# Patient Record
Sex: Male | Born: 1965
Health system: Southern US, Community
[De-identification: ages and names within clinical notes are randomized; demographics above are authoritative.]

## PROBLEM LIST (undated history)

## (undated) DIAGNOSIS — E119 Type 2 diabetes mellitus without complications: Secondary | ICD-10-CM

## (undated) DIAGNOSIS — I10 Essential (primary) hypertension: Secondary | ICD-10-CM

## (undated) HISTORY — DX: Type 2 diabetes mellitus without complications: E11.9

## (undated) HISTORY — DX: Essential (primary) hypertension: I10

---

## 1998-07-15 HISTORY — PX: CHOLECYSTECTOMY: SHX55

## 2013-10-16 ENCOUNTER — Ambulatory Visit: Payer: Self-pay | Admitting: Internal Medicine

## 2014-03-25 ENCOUNTER — Ambulatory Visit: Payer: Self-pay | Admitting: Cardiology

## 2015-11-11 ENCOUNTER — Other Ambulatory Visit: Payer: Self-pay | Admitting: Family Medicine

## 2016-01-17 DIAGNOSIS — I429 Cardiomyopathy, unspecified: Secondary | ICD-10-CM | POA: Insufficient documentation

## 2016-01-17 DIAGNOSIS — N419 Inflammatory disease of prostate, unspecified: Secondary | ICD-10-CM | POA: Insufficient documentation

## 2016-01-17 DIAGNOSIS — R0681 Apnea, not elsewhere classified: Secondary | ICD-10-CM | POA: Insufficient documentation

## 2016-01-17 DIAGNOSIS — I2510 Atherosclerotic heart disease of native coronary artery without angina pectoris: Secondary | ICD-10-CM | POA: Insufficient documentation

## 2016-01-17 DIAGNOSIS — F605 Obsessive-compulsive personality disorder: Secondary | ICD-10-CM | POA: Insufficient documentation

## 2016-01-17 DIAGNOSIS — Z8679 Personal history of other diseases of the circulatory system: Secondary | ICD-10-CM | POA: Insufficient documentation

## 2016-01-17 DIAGNOSIS — K219 Gastro-esophageal reflux disease without esophagitis: Secondary | ICD-10-CM | POA: Insufficient documentation

## 2016-11-12 ENCOUNTER — Other Ambulatory Visit: Payer: Self-pay | Admitting: Family Medicine

## 2016-12-11 LAB — HEMOGLOBIN A1C: HEMOGLOBIN A1C: 11.5

## 2016-12-12 ENCOUNTER — Encounter: Payer: Self-pay | Admitting: Family Medicine

## 2016-12-12 ENCOUNTER — Ambulatory Visit (INDEPENDENT_AMBULATORY_CARE_PROVIDER_SITE_OTHER): Payer: BLUE CROSS/BLUE SHIELD | Admitting: Family Medicine

## 2016-12-12 VITALS — BP 134/88 | HR 105 | Temp 97.8°F | Resp 16 | Wt 279.2 lb

## 2016-12-12 DIAGNOSIS — E119 Type 2 diabetes mellitus without complications: Secondary | ICD-10-CM

## 2016-12-12 DIAGNOSIS — Z13220 Encounter for screening for lipoid disorders: Secondary | ICD-10-CM | POA: Diagnosis not present

## 2016-12-12 MED ORDER — GLIPIZIDE 5 MG PO TABS: 5.0000 mg | ORAL_TABLET | Freq: Two times a day (BID) | ORAL | 3 refills | Status: DC

## 2016-12-12 NOTE — Progress Notes (Signed)
Subjective:     Patient ID: Ryan Webb, male   DOB: 10/03/1965, 51 y.o.   MRN: [id number]  HPI  Chief Complaint  Patient presents with  . Abnormal Lab    Patient comes in office today to address abnormal lab report from 12/11/16 ordered by [facility]. Patients Hgb A1C 11.5% and glucose was 495, patient states that he had his labs drawn yesterday afternoon and was not fasting.   He is accompanied by his wife today. States over the last two weeks he has noticed urinary frequency and fatigue. Reports strong family history of diabetes. No regular exercise but does walk a lot in his job. Has longtime habit of consuming 2 liters+ of Pepsi daily. Continues to be followed by cardiology, Dr. Lady GaryFath, for CAD and hypertension. Completing treatment for prostatitis per Dr. Evelene CroonWolff.   Review of Systems     Objective:   Physical Exam  Constitutional: He appears well-developed and well-nourished. No distress.  Psychiatric:  anxious       Assessment:    1. Diabetes mellitus without complication (HCC) - glipiZIDE (GLUCOTROL) 5 MG tablet; Take 1 tablet (5 mg total) by mouth 2 (two) times daily before a meal.  Dispense: 60 tablet; Refill: 3 - Comprehensive metabolic panel  2. Screening for cholesterol level - Lipid panel    Plan:   Stop Pepsi and start water. Further f/u in one week and pending labs.If renal status stable will add metformin, consider SLGT2 inhibitor.

## 2016-12-12 NOTE — Patient Instructions (Signed)
We will call you with the lab results. Stop Pepsi.

## 2016-12-13 ENCOUNTER — Other Ambulatory Visit: Payer: Self-pay | Admitting: Family Medicine

## 2016-12-13 LAB — COMPREHENSIVE METABOLIC PANEL
ALBUMIN: 4.2 g/dL (ref 3.5–5.5)
ALT: 41 IU/L (ref 0–44)
AST: 30 IU/L (ref 0–40)
Albumin/Globulin Ratio: 1.5 (ref 1.2–2.2)
Alkaline Phosphatase: 76 IU/L (ref 39–117)
BUN / CREAT RATIO: 17 (ref 9–20)
BUN: 19 mg/dL (ref 6–24)
Bilirubin Total: 0.6 mg/dL (ref 0.0–1.2)
CO2: 24 mmol/L (ref 18–29)
CREATININE: 1.14 mg/dL (ref 0.76–1.27)
Calcium: 9.5 mg/dL (ref 8.7–10.2)
Chloride: 96 mmol/L (ref 96–106)
GFR, EST AFRICAN AMERICAN: 86 mL/min/{1.73_m2} (ref >59)
GFR, EST NON AFRICAN AMERICAN: 75 mL/min/{1.73_m2} (ref >59)
GLOBULIN, TOTAL: 2.8 g/dL (ref 1.5–4.5)
Glucose: 345 mg/dL — ABNORMAL HIGH (ref 65–99)
Potassium: 5.1 mmol/L (ref 3.5–5.2)
SODIUM: 134 mmol/L (ref 134–144)
TOTAL PROTEIN: 7 g/dL (ref 6.0–8.5)

## 2016-12-13 LAB — LIPID PANEL
CHOL/HDL RATIO: 6.8 ratio — AB (ref 0.0–5.0)
Cholesterol, Total: 225 mg/dL — ABNORMAL HIGH (ref 100–199)
HDL: 33 mg/dL — AB (ref >39)
TRIGLYCERIDES: 482 mg/dL — AB (ref 0–149)

## 2016-12-15 LAB — LDL CHOLESTEROL, DIRECT: LDL DIRECT: 94 mg/dL (ref 0–99)

## 2016-12-15 LAB — SPECIMEN STATUS REPORT

## 2016-12-18 ENCOUNTER — Encounter: Payer: Self-pay | Admitting: Family Medicine

## 2016-12-19 ENCOUNTER — Encounter: Payer: Self-pay | Admitting: Family Medicine

## 2016-12-19 ENCOUNTER — Ambulatory Visit (INDEPENDENT_AMBULATORY_CARE_PROVIDER_SITE_OTHER): Payer: BLUE CROSS/BLUE SHIELD | Admitting: Family Medicine

## 2016-12-19 VITALS — BP 128/80 | HR 92 | Temp 98.5°F | Resp 16 | Wt 278.0 lb

## 2016-12-19 DIAGNOSIS — E119 Type 2 diabetes mellitus without complications: Secondary | ICD-10-CM

## 2016-12-19 LAB — GLUCOSE, POCT (MANUAL RESULT ENTRY): POC GLUCOSE: 105 mg/dL — AB (ref 70–99)

## 2016-12-19 MED ORDER — METFORMIN HCL 500 MG PO TABS: 500.0000 mg | ORAL_TABLET | Freq: Two times a day (BID) | ORAL | 1 refills | Status: DC

## 2016-12-19 NOTE — Progress Notes (Signed)
Subjective:     Patient ID: Ryan Webb, male   DOB: 12/01/1965, 51 y.o.   MRN: [id number]  HPI  Chief Complaint  Patient presents with  . Diabetes    pt reports that he is doing well on the medication and feeling ok.    Reports he has quit drinking Pepsi. Sleeping better with less urination. Accompanied by his wife and daughter.   Review of Systems     Objective:   Physical Exam  Constitutional: He appears well-developed and well-nourished. No distress.       Assessment:    1. Diabetes mellitus without complication (HCC) - POCT Glucose (CBG) - metFORMIN (GLUCOPHAGE) 500 MG tablet; Take 1 tablet (500 mg total) by mouth 2 (two) times daily with a meal.  Dispense: 60 tablet; Refill: 1 - Ambulatory referral to diabetic education    Plan:    May hold glipizide for a few days after starting metformin. Discussed updating eye exam and the possibility of adding cholesterol medication once diabetes controlled.

## 2016-12-19 NOTE — Patient Instructions (Signed)
The diabetes numbers we are trying to achieve are < 130 fasting in the AM and < 180 two hours after a meal. When starting the metformin, hold the glipizide for a few days until you are used to the metformin.

## 2017-01-22 ENCOUNTER — Encounter: Payer: Self-pay | Admitting: *Deleted

## 2017-01-22 ENCOUNTER — Encounter: Payer: BLUE CROSS/BLUE SHIELD | Attending: Family Medicine | Admitting: *Deleted

## 2017-01-22 VITALS — BP 122/80 | Ht 70.0 in | Wt 268.2 lb

## 2017-01-22 DIAGNOSIS — Z713 Dietary counseling and surveillance: Secondary | ICD-10-CM | POA: Diagnosis present

## 2017-01-22 DIAGNOSIS — E119 Type 2 diabetes mellitus without complications: Secondary | ICD-10-CM | POA: Diagnosis not present

## 2017-01-23 ENCOUNTER — Ambulatory Visit (INDEPENDENT_AMBULATORY_CARE_PROVIDER_SITE_OTHER): Payer: BLUE CROSS/BLUE SHIELD | Admitting: Family Medicine

## 2017-01-23 ENCOUNTER — Encounter: Payer: Self-pay | Admitting: Family Medicine

## 2017-01-23 VITALS — BP 130/84 | HR 99 | Temp 98.0°F | Resp 16 | Wt 269.6 lb

## 2017-01-23 DIAGNOSIS — E119 Type 2 diabetes mellitus without complications: Secondary | ICD-10-CM

## 2017-01-23 DIAGNOSIS — H6123 Impacted cerumen, bilateral: Secondary | ICD-10-CM

## 2017-01-23 MED ORDER — METFORMIN HCL 1000 MG PO TABS: 1000.0000 mg | ORAL_TABLET | Freq: Two times a day (BID) | ORAL | 3 refills | Status: DC

## 2017-01-23 MED ORDER — GLUCOSE BLOOD VI STRP: ORAL_STRIP | 3 refills | Status: AC

## 2017-01-23 NOTE — Patient Instructions (Signed)
Check blood sugars 2 x day before breakfast and 2 hrs after supper every day Bring blood sugar records to the next class  Call your doctor for a prescription for:  1. Meter strips (type) Contour Next  checking  2 times per day  2. Lancets (type) Contour Microlet checking  2      times per day  Exercise: Begin walking  for  10 minutes   3 days a week and gradually increase to 30 minutes 5 x week  Eat 3 meals day,  1-2  snacks a day Space meals 4-6 hours apart Don't skip meals Limit fried foods Continue to avoid sugar sweetened drinks (soda)   Return for classes on:

## 2017-01-23 NOTE — Patient Instructions (Addendum)
Continue to follow up with the [facility] classes. Let me know if you can't tolerate the higher dose of metformin. Do update your eye exam.

## 2017-01-23 NOTE — Progress Notes (Signed)
Subjective:     Patient ID: Lucas Mallowony W Dorin, male   DOB: 04/25/1966, 51 y.o.   MRN: [id number]  HPI  Chief Complaint  Patient presents with  . Diabetes    Patient returns back to office today for follow up patient was last seen on 12/19/16. Patients glucose at last visit was 105 and he was started on Metformin 500mg  BID. Patient reports good compliance and tolerance in medication. Patient reports that he saw nutrionist yesterday and has [facility], patient states that his glucose yesterday was 112. Patient reports this monring he checked blood sugar when he woke and it was 168.  Reports he discontinued glipizide due to two shaky episodes which improved with ingestion of a sweet. Also states he was using a Q-tip in his left ear and now can't hear well out of it. LDL was below 100 so have elected to titrate diabetes medication prior to adding cholesterol medication. Accompanied by his wife and daughter today.   Review of Systems     Objective:   Physical Exam  Constitutional: He appears well-developed and well-nourished. No distress.  HENT:  Bilateral cerumen obstruction: After irrigation by Kat ear canals are patent, T.M.'s intact, and patient reports improvement in his hearing.       Assessment:    1. Diabetes mellitus without complication (HCC) - metFORMIN (GLUCOPHAGE) 1000 MG tablet; Take 1 tablet (1,000 mg total) by mouth 2 (two) times daily with a meal.  Dispense: 180 tablet; Refill: 3 - glucose blood test strip; Check sugar twice daily  Dispense: 100 each; Refill: 3  2. Obstruction of ventilation tube of both ears by cerumen - EAR CERUMEN REMOVAL    Plan:    [facility]. A1C in two months on increased dose of metformin.

## 2017-01-23 NOTE — Progress Notes (Signed)
Diabetes Self-Management Education  Visit Type: First/Initial  Appt. Start Time: 1605 Appt. End Time: 1715  01/22/2017  [name], identified by name and date of birth, is a 51 y.o. male with a diagnosis of Diabetes: Type 2.   ASSESSMENT  Blood pressure 122/80, height 5\' 10"  (1.778 m), weight 268 lb 3.2 oz (121.7 kg). Body mass index is 38.48 kg/m.      Diabetes Self-Management Education - 01/22/17 1720      Visit Information   Visit Type First/Initial     Initial Visit   Diabetes Type Type 2   Are you currently following a meal plan? Yes   What type of meal plan do you follow? "no soft drinks, no sweets"   Are you taking your medications as prescribed? Yes   Date Diagnosed 1 month ago     Health Coping   How would you rate your overall health? Good     Psychosocial Assessment   Patient Belief/Attitude about Diabetes Motivated to manage diabetes  "concerned"   Self-care barriers None   Self-management support Doctor's office;Family   Other persons present Spouse/SO   Patient Concerns Nutrition/Meal planning;Medication;Monitoring;Healthy Lifestyle;Problem Solving;Weight Control;Glycemic Control   Special Needs None   Preferred Learning Style Auditory;Hands on   Learning Readiness Change in progress   How often do you need to have someone help you when you read instructions, pamphlets, or other written materials from your doctor or pharmacy? 1 - Never   What is the last grade level you completed in school? 12th     Pre-Education Assessment   Patient understands the diabetes disease and treatment process. Needs Instruction   Patient understands incorporating nutritional management into lifestyle. Needs Instruction   Patient undertands incorporating physical activity into lifestyle. Needs Instruction   Patient understands using medications safely. Needs Instruction   Patient understands monitoring blood glucose, interpreting and using results Needs Instruction   Patient understands prevention, detection, and treatment of acute complications. Needs Instruction   Patient understands prevention, detection, and treatment of chronic complications. Needs Instruction   Patient understands how to develop strategies to address psychosocial issues. Needs Instruction   Patient understands how to develop strategies to promote health/change behavior. Needs Instruction     Complications   Last HgB A1C per patient/outside source 11.5 %  12/11/16   How often do you check your blood sugar? 0 times/day (not testing)  Provided Contour Next One meter and instructed on use. BG upon return demonstration was 112 mg/dL at 4:094:55 pm - 5 hrs pp.    Have you had a dilated eye exam in the past 12 months? Yes   Have you had a dental exam in the past 12 months? Yes     Dietary Intake   Breakfast N/A   Snack (morning) peanut butter crackers   Lunch Congohinese, chicken and broccoli, ham biscuit, small round pizza, french fries   Snack (afternoon) fruit   Dinner Cong[place]inese, grilled or baked chicken, beef, fish, salad, green beans, potatoes, pintos   Beverage(s) water  - was drinking 3 Liters of Pepsi per day     Exercise   Exercise Type ADL's     Patient Education   Previous Diabetes Education No   Disease state  Definition of diabetes, type 1 and 2, and the diagnosis of diabetes   Nutrition management  Role of diet in the treatment of diabetes and the relationship between the three main macronutrients and blood glucose level;Reviewed blood glucose goals for pre  and post meals and how to evaluate the patients' food intake on their blood glucose level.   Physical activity and exercise  Role of exercise on diabetes management, blood pressure control and cardiac health.   Medications Reviewed patients medication for diabetes, action, purpose, timing of dose and side effects.   Monitoring Taught/evaluated SMBG meter.;Purpose and frequency of SMBG.;Taught/discussed recording of test results  and interpretation of SMBG.;Identified appropriate SMBG and/or A1C goals.   Chronic complications Relationship between chronic complications and blood glucose control   Psychosocial adjustment Identified and addressed patients feelings and concerns about diabetes     Individualized Goals (developed by patient)   Reducing Risk Improve blood sugars Decrease medications Prevent diabetes complications Lose weight Lead a healthier lifestyle Become more fit     Outcomes   Expected Outcomes Demonstrated interest in learning. Expect positive outcomes   Future DMSE 2 wks      Individualized Plan for Diabetes Self-Management Training:   Learning Objective:  Patient will have a greater understanding of diabetes self-management. Patient education plan is to attend individual and/or group sessions per assessed needs and concerns.   Plan:   Patient Instructions  Check blood sugars 2 x day before breakfast and 2 hrs after supper every day Bring blood sugar records to the next class Call your doctor for a prescription for:  1. Meter strips (type) Contour Next  checking  2 times per day  2. Lancets (type) Contour Microlet checking  2      times per day Exercise: Begin walking  for  10 minutes   3 days a week and gradually increase to 30 minutes 5 x week Eat 3 meals day,  1-2  snacks a day Space meals 4-6 hours apart Don't skip meals Limit fried foods Continue to avoid sugar sweetened drinks (soda)    Expected Outcomes:  Demonstrated interest in learning. Expect positive outcomes  Education material provided:  General Meal Planning Guidelines Simple Meal Plan Meter = Contour Next One  If problems or questions, patient to contact team via:  Sharion Settler, RN, CCM, CDE [phone]  Future DSME appointment: 2 wks  July 30 for Diabetes Class 1

## 2017-02-10 ENCOUNTER — Encounter: Payer: Self-pay | Admitting: Dietician

## 2017-02-10 ENCOUNTER — Encounter: Payer: BLUE CROSS/BLUE SHIELD | Admitting: Dietician

## 2017-02-10 VITALS — Ht 70.0 in | Wt 264.3 lb

## 2017-02-10 DIAGNOSIS — Z713 Dietary counseling and surveillance: Secondary | ICD-10-CM | POA: Diagnosis not present

## 2017-02-10 DIAGNOSIS — E119 Type 2 diabetes mellitus without complications: Secondary | ICD-10-CM

## 2017-02-10 NOTE — Progress Notes (Signed)

## 2017-02-17 ENCOUNTER — Encounter: Payer: BLUE CROSS/BLUE SHIELD | Attending: Family Medicine | Admitting: Dietician

## 2017-02-17 ENCOUNTER — Encounter: Payer: Self-pay | Admitting: Dietician

## 2017-02-17 VITALS — Wt 258.5 lb

## 2017-02-17 DIAGNOSIS — E119 Type 2 diabetes mellitus without complications: Secondary | ICD-10-CM | POA: Diagnosis not present

## 2017-02-17 DIAGNOSIS — Z713 Dietary counseling and surveillance: Secondary | ICD-10-CM | POA: Insufficient documentation

## 2017-02-17 NOTE — Progress Notes (Signed)

## 2017-02-26 ENCOUNTER — Telehealth: Payer: Self-pay | Admitting: Dietician

## 2017-02-26 NOTE — Telephone Encounter (Signed)
Called patient to reschedule class 3 which he missed on 02/24/17. He rescheduled for 03/31/17.

## 2017-02-27 ENCOUNTER — Encounter: Payer: Self-pay | Admitting: Family Medicine

## 2017-03-20 ENCOUNTER — Encounter: Payer: Self-pay | Admitting: Family Medicine

## 2017-03-20 ENCOUNTER — Ambulatory Visit (INDEPENDENT_AMBULATORY_CARE_PROVIDER_SITE_OTHER): Payer: BLUE CROSS/BLUE SHIELD | Admitting: Family Medicine

## 2017-03-20 VITALS — BP 130/84 | HR 95 | Temp 98.3°F | Resp 16 | Wt 254.4 lb

## 2017-03-20 DIAGNOSIS — E119 Type 2 diabetes mellitus without complications: Secondary | ICD-10-CM | POA: Diagnosis not present

## 2017-03-20 DIAGNOSIS — E781 Pure hyperglyceridemia: Secondary | ICD-10-CM | POA: Diagnosis not present

## 2017-03-20 LAB — POCT GLYCOSYLATED HEMOGLOBIN (HGB A1C): Hemoglobin A1C: 6

## 2017-03-20 NOTE — Patient Instructions (Signed)
We will call you with the lab results. 

## 2017-03-20 NOTE — Progress Notes (Signed)
Subjective:     Patient ID: Ryan Webb, male   DOB: 10/13/1965, 51 y.o.   MRN: [id number]  HPI  Chief Complaint  Patient presents with  . Diabetes    Patient comes in office today to follow up for diabetes, patient as last seen on 01/23/17 and we increased Metfomin to 1000mg  BID. Patient reports that blood sugar readings at home range from 90-130. Patient states he has good compluace and tolerance on mediation, he has been watching his diet and exercising. Patient reports hat [facility] since [facility] visit    States he push mows his lawn for an hour and a half and walks for 30 minutes at the gym 3 x week. He is attending [facility] Classes. Reports eye exam pending in November.   Review of Systems  Respiratory: Negative for shortness of breath.   Cardiovascular: Negative for chest pain and palpitations.       Objective:   Physical Exam  Constitutional: He appears well-developed and well-nourished. No distress.  Cardiovascular: Normal rate and regular rhythm.   Pulmonary/Chest: Breath sounds normal.  Musculoskeletal: He exhibits no edema (of lower extremities).       Assessment:    1. Hypertriglyceridemia - Lipid panel  2. Diabetes mellitus without complication (HCC) - POCT glycosylated hemoglobin (Hb A1C)   Plan:    Further f/u pending lab work.

## 2017-03-31 ENCOUNTER — Encounter: Payer: BLUE CROSS/BLUE SHIELD | Attending: Family Medicine

## 2017-03-31 ENCOUNTER — Encounter: Payer: Self-pay | Admitting: Dietician

## 2017-03-31 DIAGNOSIS — Z713 Dietary counseling and surveillance: Secondary | ICD-10-CM | POA: Insufficient documentation

## 2017-03-31 DIAGNOSIS — E119 Type 2 diabetes mellitus without complications: Secondary | ICD-10-CM | POA: Insufficient documentation

## 2017-03-31 NOTE — Progress Notes (Signed)
Pt did not come to class 3 tonight. Called pt x2 and line busy x2

## 2017-04-30 ENCOUNTER — Encounter: Payer: Self-pay | Admitting: *Deleted

## 2017-06-19 ENCOUNTER — Encounter: Payer: Self-pay | Admitting: Family Medicine

## 2017-06-19 ENCOUNTER — Ambulatory Visit: Payer: BLUE CROSS/BLUE SHIELD | Admitting: Family Medicine

## 2017-06-19 VITALS — BP 142/94 | HR 97 | Temp 98.3°F | Resp 16 | Wt 257.0 lb

## 2017-06-19 DIAGNOSIS — E119 Type 2 diabetes mellitus without complications: Secondary | ICD-10-CM

## 2017-06-19 DIAGNOSIS — E781 Pure hyperglyceridemia: Secondary | ICD-10-CM | POA: Diagnosis not present

## 2017-06-19 LAB — POCT GLYCOSYLATED HEMOGLOBIN (HGB A1C): HEMOGLOBIN A1C: 5.9

## 2017-06-19 NOTE — Progress Notes (Signed)
Subjective:     Patient ID: Ryan Webb, male   DOB: 10/11/1965, 51 y.o.   MRN: [id number] Chief Complaint  Patient presents with  . Hyperlipidemia    Patient returns to office for 3 month follow up, patients last office visit was 03/20/17 Lipid Panel was ordered. Patient states that he exercises 3x a week and is working on improving diet.   . Diabetes    Patient returns for 3 month follow up , last office visit was 03/30/17 and HgbA1C was 6%. Patient reports good compliance and tolerance on medication. Patient reports 3 hyperglycemia incidents since last visit which occured after exercising, patient reports feeling lightheaded and dizzy.    HPI States he forgot to get lipid profile. Continues to watch his diet and exercises 3-4 x week: "I feel the best I have in 10 years." Defers vaccines. Wishes to discuss colonoscopy at next office visit.  Review of Systems  Cardiovascular:       Saw his cardiologist in November.       Objective:   Physical Exam  Constitutional: He appears well-developed and well-nourished. No distress.  Lungs: clear Heart: RRR without murmur Lower extremities: no edema; pedal pulses intact, sensation to monofilament intact, no wounds noted.     Assessment:    1. Diabetes mellitus without complication (HCC) - POCT glycosylated hemoglobin (Hb A1C)  2. Hypertriglyceridemia - Lipid panel    Plan:    Further f/u pending lipid profile. Decrease metformin to 500 mg twice daily pending next o.v.

## 2017-06-19 NOTE — Patient Instructions (Signed)
Reduce metformin to 500 mg. twice daily (split the 1000 mg pill). I will call you with the lab results. Continue exercise and dietary choices.

## 2017-08-22 ENCOUNTER — Other Ambulatory Visit: Payer: Self-pay | Admitting: Family Medicine

## 2017-08-22 DIAGNOSIS — E119 Type 2 diabetes mellitus without complications: Secondary | ICD-10-CM

## 2017-08-22 NOTE — Telephone Encounter (Signed)
Pt needing [facility]MG  [facility] [place]

## 2017-09-18 ENCOUNTER — Encounter: Payer: Self-pay | Admitting: Family Medicine

## 2017-09-18 ENCOUNTER — Ambulatory Visit: Payer: BLUE CROSS/BLUE SHIELD | Admitting: Family Medicine

## 2017-09-18 VITALS — BP 134/82 | HR 102 | Temp 98.2°F | Resp 16 | Wt 267.0 lb

## 2017-09-18 DIAGNOSIS — Z1211 Encounter for screening for malignant neoplasm of colon: Secondary | ICD-10-CM

## 2017-09-18 DIAGNOSIS — E119 Type 2 diabetes mellitus without complications: Secondary | ICD-10-CM | POA: Diagnosis not present

## 2017-09-18 DIAGNOSIS — E781 Pure hyperglyceridemia: Secondary | ICD-10-CM | POA: Diagnosis not present

## 2017-09-18 LAB — POCT GLYCOSYLATED HEMOGLOBIN (HGB A1C): HEMOGLOBIN A1C: 6.5

## 2017-09-18 NOTE — Progress Notes (Signed)
Subjective:     Patient ID: Ryan Webb, male   DOB: 02/08/1966, 52 y.o.   MRN: [id number] Chief Complaint  Patient presents with  . Diabetes    Patient returns to office today for 3 month follow up, last visit 06/19/17 HgbA1C was 5.9%. Patient reports that his sugar readings at home has been 130-160 he denies any hyperglycemia incidents. Patient reports good compliance on medication.   . Hyperlipidemia    Follow up from 06/19/17, lipid panel was ordered however patient states that he has not had lab drawn. Patient reports poor diet and is not actively exercising.    HPI Weight is up10#. States he got out of exercise routine and careful eating around Christmas time. Agrees to get a screening colonoscopy.Continues to be followed by cardiology for cardiomyopathy.  Review of Systems     Objective:   Physical Exam  Constitutional: He appears well-developed and well-nourished. No distress.  Cardiovascular: Normal rate and regular rhythm.  Pulmonary/Chest: Breath sounds normal.  Musculoskeletal: He exhibits no edema (of lower extremities).       Assessment:    1. Diabetes mellitus without complication (HCC):  Resume metformin 1000 mg. Twice daily, - POCT glycosylated hemoglobin (Hb A1C)  2. Hypertriglyceridemia - Lipid panel  3. Screen for colon cancer - Ambulatory referral to Gastroenterology    Plan:    Resume exercise routine. F/u in 3 months and pending lab results.

## 2017-09-18 NOTE — Patient Instructions (Signed)
Resume your exercise routine and take a whole metformin twice daily.

## 2017-09-27 LAB — LIPID PANEL
CHOL/HDL RATIO: 6.7 ratio — AB (ref 0.0–5.0)
Cholesterol, Total: 220 mg/dL — ABNORMAL HIGH (ref 100–199)
HDL: 33 mg/dL — AB (ref >39)
LDL CALC: 130 mg/dL — AB (ref 0–99)
Triglycerides: 284 mg/dL — ABNORMAL HIGH (ref 0–149)
VLDL Cholesterol Cal: 57 mg/dL — ABNORMAL HIGH (ref 5–40)

## 2017-09-29 ENCOUNTER — Telehealth: Payer: Self-pay

## 2017-09-29 NOTE — Telephone Encounter (Signed)
Unable to reach patient at this time, will try and call back at a later time. KW

## 2017-09-29 NOTE — Telephone Encounter (Signed)
-----   Message from Anola Gurneyobert Chauvin, [place] sent at 09/29/2017  7:40 AM EDT ----- Cholesterol is higher. Would recommend starting a cholesterol lowering drug. Does he wish to proceed?

## 2017-09-30 ENCOUNTER — Other Ambulatory Visit: Payer: Self-pay | Admitting: Family Medicine

## 2017-09-30 DIAGNOSIS — E782 Mixed hyperlipidemia: Secondary | ICD-10-CM

## 2017-09-30 MED ORDER — ATORVASTATIN CALCIUM 80 MG PO TABS: 80.0000 mg | ORAL_TABLET | Freq: Every day | ORAL | 1 refills | Status: AC

## 2017-09-30 NOTE — Telephone Encounter (Signed)
Started on high dose atorvastatin

## 2017-09-30 NOTE — Telephone Encounter (Signed)
Patient advised, he agrees to starting new medication and would like it sent to [facility] [place]. KW

## 2017-10-21 ENCOUNTER — Encounter: Payer: Self-pay | Admitting: *Deleted

## 2017-11-18 ENCOUNTER — Telehealth: Payer: Self-pay | Admitting: Gastroenterology

## 2017-11-18 NOTE — Telephone Encounter (Signed)
Patient received colonoscopy letter. He is ready to schedule.

## 2017-11-19 ENCOUNTER — Other Ambulatory Visit: Payer: Self-pay

## 2017-11-19 DIAGNOSIS — Z1211 Encounter for screening for malignant neoplasm of colon: Secondary | ICD-10-CM

## 2017-11-19 NOTE — Telephone Encounter (Signed)
Gastroenterology Pre-Procedure Review  Request Date: 12/26/17  Requesting Physician: Dr. Tobi Bastos   PATIENT REVIEW QUESTIONS: The patient responded to the following health history questions as indicated:    1. Are you having any GI issues? No  2. Do you have a personal history of Polyps? No  3. Do you have a family history of Colon Cancer or Polyps? No  4. Diabetes Mellitus? No  5. Joint replacements in the past 12 months? No  6. Major health problems in the past 3 months? No  7. Any artificial heart valves, MVP, or defibrillator? No     MEDICATIONS & ALLERGIES:    Patient reports the following regarding taking any anticoagulation/antiplatelet therapy:   Plavix, Coumadin, Eliquis, Xarelto, Lovenox, Pradaxa, Brilinta, or Effient? No  Aspirin? Yes, 81 mg   Patient confirms/reports the following medications:  Current Outpatient Medications  Medication Sig Dispense Refill  . aspirin EC 81 MG tablet Take 81 mg by mouth daily.    Marland Kitchen atorvastatin (LIPITOR) 80 MG tablet Take 1 tablet (80 mg total) by mouth daily. 90 tablet 1  . carvedilol (COREG) 3.125 MG tablet Take 3.125 mg by mouth 2 (two) times daily with a meal.     . clomiPRAMINE (ANAFRANIL) 50 MG capsule TAKE 2 CAPSULES BY MOUTH AT BEDTIME 180 capsule 3  . furosemide (LASIX) 20 MG tablet Take 20 mg by mouth daily.     Marland Kitchen glucose blood test strip Check sugar twice daily 100 each 3  . lisinopril (PRINIVIL,ZESTRIL) 2.5 MG tablet Take 2.5 mg by mouth daily.     . metFORMIN (GLUCOPHAGE) 1000 MG tablet Take 1 tablet (1,000 mg total) by mouth 2 (two) times daily with a meal. 180 tablet 3   No current facility-administered medications for this visit.     Patient confirms/reports the following allergies:  No Known Allergies  No orders of the defined types were placed in this encounter.   AUTHORIZATION INFORMATION Primary Insurance: 1D#: Group #:  Secondary Insurance: 1D#: Group #:  SCHEDULE INFORMATION: Date:  12/26/17 Time: Location: [facility]

## 2017-11-21 ENCOUNTER — Other Ambulatory Visit: Payer: Self-pay

## 2017-12-09 ENCOUNTER — Other Ambulatory Visit: Payer: Self-pay | Admitting: Family Medicine

## 2017-12-19 ENCOUNTER — Encounter: Payer: Self-pay | Admitting: Family Medicine

## 2017-12-19 ENCOUNTER — Ambulatory Visit: Payer: BLUE CROSS/BLUE SHIELD | Admitting: Family Medicine

## 2017-12-19 VITALS — BP 122/94 | HR 143 | Temp 98.4°F | Resp 16 | Wt 265.6 lb

## 2017-12-19 DIAGNOSIS — K219 Gastro-esophageal reflux disease without esophagitis: Secondary | ICD-10-CM

## 2017-12-19 DIAGNOSIS — R000 Tachycardia, unspecified: Secondary | ICD-10-CM | POA: Diagnosis not present

## 2017-12-19 DIAGNOSIS — E782 Mixed hyperlipidemia: Secondary | ICD-10-CM

## 2017-12-19 DIAGNOSIS — E119 Type 2 diabetes mellitus without complications: Secondary | ICD-10-CM

## 2017-12-19 MED ORDER — LANSOPRAZOLE 30 MG PO CPDR: 30.0000 mg | DELAYED_RELEASE_CAPSULE | Freq: Every day | ORAL | 0 refills | Status: AC

## 2017-12-19 MED ORDER — METFORMIN HCL 1000 MG PO TABS: 1000.0000 mg | ORAL_TABLET | Freq: Two times a day (BID) | ORAL | 3 refills | Status: AC

## 2017-12-19 MED ORDER — SUCRALFATE 1 G PO TABS: 1.0000 g | ORAL_TABLET | Freq: Three times a day (TID) | ORAL | 0 refills | Status: AC

## 2017-12-19 NOTE — Progress Notes (Signed)
  Subjective:     Patient ID: Ryan Webb, male   DOB: 03/10/1966, 52 y.o.   MRN: [id number] Chief Complaint  Patient presents with  . Diabetes    Patient returns to office today for follow up, patient was last seen 09/18/17 and HgbA1c was 6.5%. Patient denies symptoms of increased thirst or urintion, patient reports good compliance and tolerane on medication.  . Hyperlipidemia    Patient returns for 3 month folow up   HPI States he tries to go to the gym 3 x week and has lost two # since prior visit. Reports increased reflux and abdominal bloating over the last 3 weeks. He is pending colonoscopy in one week. Also reports cardiology f/u 5/29 (Dr. Lady GaryFath) without changes in his medication. He had an eye exam in January, 2019. He is noted to have an asymptomatic tachycardia on presentation today which does not resolve during his office visit.  Review of Systems     Objective:   Physical Exam  Constitutional: He appears well-developed and well-nourished. No distress.  Cardiovascular: Regular rhythm. Tachycardia present.  Abdominal: Soft. There is no tenderness.       Assessment:    1. Tachycardia - EKG 12-Lead  2. Hyperlipidemia, mixed - Lipid panel  3. Diabetes mellitus without complication [facility](HCC): controlled - Comprehensive metabolic panel - metFORMIN (GLUCOPHAGE) 1000 MG tablet; Take 1 tablet (1,000 mg total) by mouth 2 (two) times daily with a meal.  Dispense: 180 tablet; Refill: 3  4. Gastroesophageal reflux disease without esophagitis - lansoprazole (PREVACID) 30 MG capsule; Take 1 capsule (30 mg total) by mouth daily at 12 noon.  Dispense: 30 capsule; Refill: 0 - sucralfate (CARAFATE) 1 g tablet; Take 1 tablet (1 g total) by mouth 4 (four) times daily -  with meals and at bedtime.  Dispense: 28 tablet; Refill: 0    Plan:    Will have him double up on his carvedilol until he can contact his cardiologist next week. EKG copy provided. He is to report to the ER if symptomatic. We  will call once lab results are available.

## 2017-12-19 NOTE — Patient Instructions (Addendum)
Start new medication for reflux symptoms. Contact Dr. Lady GaryFath next week about your rapid heart rate. Over the weekend increase your carvedilol to two pills twice daily. If you start getting symptoms like increased shortness of breath or chest pain report to the ER. We will call you about your lab results.

## 2017-12-24 ENCOUNTER — Telehealth: Payer: Self-pay | Admitting: Gastroenterology

## 2017-12-24 NOTE — Telephone Encounter (Signed)
Patients colonoscopy has been canceled due to heart problems.  He will call back to reschedule at a later time when his health is better.

## 2017-12-24 NOTE — Telephone Encounter (Signed)
Pt left vm he states  He is scheduled for colonoscopt 06/14/198 he needs to cancel he is having a heart rate problem and his heart Doctor recommends to hold off

## 2017-12-26 ENCOUNTER — Ambulatory Visit: Admit: 2017-12-26 | Payer: BLUE CROSS/BLUE SHIELD | Admitting: Gastroenterology

## 2017-12-26 SURGERY — COLONOSCOPY WITH PROPOFOL
Anesthesia: General

## 2018-01-02 ENCOUNTER — Inpatient Hospital Stay
Admission: EM | Admit: 2018-01-02 | Discharge: 2018-02-12 | DRG: 870 | Disposition: E | Payer: BLUE CROSS/BLUE SHIELD | Attending: Internal Medicine | Admitting: Internal Medicine

## 2018-01-02 ENCOUNTER — Emergency Department: Payer: BLUE CROSS/BLUE SHIELD

## 2018-01-02 ENCOUNTER — Encounter: Payer: Self-pay | Admitting: Emergency Medicine

## 2018-01-02 ENCOUNTER — Other Ambulatory Visit: Payer: Self-pay

## 2018-01-02 DIAGNOSIS — J9602 Acute respiratory failure with hypercapnia: Secondary | ICD-10-CM | POA: Diagnosis present

## 2018-01-02 DIAGNOSIS — E871 Hypo-osmolality and hyponatremia: Secondary | ICD-10-CM | POA: Diagnosis not present

## 2018-01-02 DIAGNOSIS — R000 Tachycardia, unspecified: Secondary | ICD-10-CM | POA: Diagnosis present

## 2018-01-02 DIAGNOSIS — Z825 Family history of asthma and other chronic lower respiratory diseases: Secondary | ICD-10-CM

## 2018-01-02 DIAGNOSIS — J9601 Acute respiratory failure with hypoxia: Secondary | ICD-10-CM | POA: Diagnosis present

## 2018-01-02 DIAGNOSIS — I4510 Unspecified right bundle-branch block: Secondary | ICD-10-CM | POA: Diagnosis present

## 2018-01-02 DIAGNOSIS — J181 Lobar pneumonia, unspecified organism: Secondary | ICD-10-CM | POA: Diagnosis not present

## 2018-01-02 DIAGNOSIS — R0602 Shortness of breath: Secondary | ICD-10-CM | POA: Diagnosis not present

## 2018-01-02 DIAGNOSIS — I2510 Atherosclerotic heart disease of native coronary artery without angina pectoris: Secondary | ICD-10-CM | POA: Diagnosis present

## 2018-01-02 DIAGNOSIS — R579 Shock, unspecified: Secondary | ICD-10-CM

## 2018-01-02 DIAGNOSIS — R609 Edema, unspecified: Secondary | ICD-10-CM

## 2018-01-02 DIAGNOSIS — R1110 Vomiting, unspecified: Secondary | ICD-10-CM

## 2018-01-02 DIAGNOSIS — F419 Anxiety disorder, unspecified: Secondary | ICD-10-CM | POA: Diagnosis not present

## 2018-01-02 DIAGNOSIS — A419 Sepsis, unspecified organism: Secondary | ICD-10-CM | POA: Diagnosis not present

## 2018-01-02 DIAGNOSIS — E875 Hyperkalemia: Secondary | ICD-10-CM | POA: Diagnosis not present

## 2018-01-02 DIAGNOSIS — D65 Disseminated intravascular coagulation [defibrination syndrome]: Secondary | ICD-10-CM | POA: Diagnosis not present

## 2018-01-02 DIAGNOSIS — Z66 Do not resuscitate: Secondary | ICD-10-CM | POA: Diagnosis not present

## 2018-01-02 DIAGNOSIS — R7401 Elevation of levels of liver transaminase levels: Secondary | ICD-10-CM

## 2018-01-02 DIAGNOSIS — Z9911 Dependence on respirator [ventilator] status: Secondary | ICD-10-CM

## 2018-01-02 DIAGNOSIS — Z978 Presence of other specified devices: Secondary | ICD-10-CM

## 2018-01-02 DIAGNOSIS — T508X5A Adverse effect of diagnostic agents, initial encounter: Secondary | ICD-10-CM | POA: Diagnosis not present

## 2018-01-02 DIAGNOSIS — Z7984 Long term (current) use of oral hypoglycemic drugs: Secondary | ICD-10-CM

## 2018-01-02 DIAGNOSIS — I4891 Unspecified atrial fibrillation: Secondary | ICD-10-CM | POA: Diagnosis not present

## 2018-01-02 DIAGNOSIS — E8809 Other disorders of plasma-protein metabolism, not elsewhere classified: Secondary | ICD-10-CM | POA: Diagnosis not present

## 2018-01-02 DIAGNOSIS — I429 Cardiomyopathy, unspecified: Secondary | ICD-10-CM | POA: Diagnosis present

## 2018-01-02 DIAGNOSIS — N170 Acute kidney failure with tubular necrosis: Secondary | ICD-10-CM | POA: Diagnosis not present

## 2018-01-02 DIAGNOSIS — Z833 Family history of diabetes mellitus: Secondary | ICD-10-CM

## 2018-01-02 DIAGNOSIS — Z515 Encounter for palliative care: Secondary | ICD-10-CM | POA: Diagnosis not present

## 2018-01-02 DIAGNOSIS — K7589 Other specified inflammatory liver diseases: Secondary | ICD-10-CM | POA: Diagnosis present

## 2018-01-02 DIAGNOSIS — Z9049 Acquired absence of other specified parts of digestive tract: Secondary | ICD-10-CM

## 2018-01-02 DIAGNOSIS — L8990 Pressure ulcer of unspecified site, unspecified stage: Secondary | ICD-10-CM

## 2018-01-02 DIAGNOSIS — Z01818 Encounter for other preprocedural examination: Secondary | ICD-10-CM

## 2018-01-02 DIAGNOSIS — D649 Anemia, unspecified: Secondary | ICD-10-CM | POA: Diagnosis not present

## 2018-01-02 DIAGNOSIS — R402313 Coma scale, best motor response, none, at hospital admission: Secondary | ICD-10-CM | POA: Diagnosis not present

## 2018-01-02 DIAGNOSIS — J9600 Acute respiratory failure, unspecified whether with hypoxia or hypercapnia: Secondary | ICD-10-CM | POA: Diagnosis present

## 2018-01-02 DIAGNOSIS — J9690 Respiratory failure, unspecified, unspecified whether with hypoxia or hypercapnia: Secondary | ICD-10-CM

## 2018-01-02 DIAGNOSIS — R0600 Dyspnea, unspecified: Secondary | ICD-10-CM

## 2018-01-02 DIAGNOSIS — K7200 Acute and subacute hepatic failure without coma: Secondary | ICD-10-CM | POA: Diagnosis not present

## 2018-01-02 DIAGNOSIS — Y95 Nosocomial condition: Secondary | ICD-10-CM | POA: Diagnosis not present

## 2018-01-02 DIAGNOSIS — Z888 Allergy status to other drugs, medicaments and biological substances status: Secondary | ICD-10-CM

## 2018-01-02 DIAGNOSIS — Z6841 Body Mass Index (BMI) 40.0 and over, adult: Secondary | ICD-10-CM

## 2018-01-02 DIAGNOSIS — G9340 Encephalopathy, unspecified: Secondary | ICD-10-CM | POA: Diagnosis not present

## 2018-01-02 DIAGNOSIS — K567 Ileus, unspecified: Secondary | ICD-10-CM | POA: Diagnosis not present

## 2018-01-02 DIAGNOSIS — T383X5A Adverse effect of insulin and oral hypoglycemic [antidiabetic] drugs, initial encounter: Secondary | ICD-10-CM | POA: Diagnosis present

## 2018-01-02 DIAGNOSIS — E872 Acidosis: Secondary | ICD-10-CM | POA: Diagnosis not present

## 2018-01-02 DIAGNOSIS — R402213 Coma scale, best verbal response, none, at hospital admission: Secondary | ICD-10-CM | POA: Diagnosis not present

## 2018-01-02 DIAGNOSIS — E870 Hyperosmolality and hypernatremia: Secondary | ICD-10-CM | POA: Diagnosis not present

## 2018-01-02 DIAGNOSIS — R34 Anuria and oliguria: Secondary | ICD-10-CM | POA: Diagnosis not present

## 2018-01-02 DIAGNOSIS — K219 Gastro-esophageal reflux disease without esophagitis: Secondary | ICD-10-CM | POA: Diagnosis present

## 2018-01-02 DIAGNOSIS — I5022 Chronic systolic (congestive) heart failure: Secondary | ICD-10-CM | POA: Diagnosis present

## 2018-01-02 DIAGNOSIS — F605 Obsessive-compulsive personality disorder: Secondary | ICD-10-CM | POA: Diagnosis present

## 2018-01-02 DIAGNOSIS — I4892 Unspecified atrial flutter: Secondary | ICD-10-CM | POA: Diagnosis present

## 2018-01-02 DIAGNOSIS — R6521 Severe sepsis with septic shock: Secondary | ICD-10-CM | POA: Diagnosis present

## 2018-01-02 DIAGNOSIS — Z4659 Encounter for fitting and adjustment of other gastrointestinal appliance and device: Secondary | ICD-10-CM

## 2018-01-02 DIAGNOSIS — R140 Abdominal distension (gaseous): Secondary | ICD-10-CM

## 2018-01-02 DIAGNOSIS — R402113 Coma scale, eyes open, never, at hospital admission: Secondary | ICD-10-CM | POA: Diagnosis not present

## 2018-01-02 DIAGNOSIS — Z7982 Long term (current) use of aspirin: Secondary | ICD-10-CM

## 2018-01-02 DIAGNOSIS — E876 Hypokalemia: Secondary | ICD-10-CM | POA: Diagnosis not present

## 2018-01-02 DIAGNOSIS — R740 Nonspecific elevation of levels of transaminase and lactic acid dehydrogenase [LDH]: Secondary | ICD-10-CM

## 2018-01-02 DIAGNOSIS — I110 Hypertensive heart disease with heart failure: Secondary | ICD-10-CM | POA: Diagnosis present

## 2018-01-02 LAB — CBC
HCT: 34.9 % — ABNORMAL LOW (ref 40.0–52.0)
Hemoglobin: 11.7 g/dL — ABNORMAL LOW (ref 13.0–18.0)
MCH: 33.1 pg (ref 26.0–34.0)
MCHC: 33.6 g/dL (ref 32.0–36.0)
MCV: 98.6 fL (ref 80.0–100.0)
PLATELETS: 304 10*3/uL (ref 150–440)
RBC: 3.54 MIL/uL — AB (ref 4.40–5.90)
RDW: 16.7 % — ABNORMAL HIGH (ref 11.5–14.5)
WBC: 8.9 10*3/uL (ref 3.8–10.6)

## 2018-01-02 LAB — BASIC METABOLIC PANEL
Anion gap: 11 (ref 5–15)
BUN: 31 mg/dL — ABNORMAL HIGH (ref 6–20)
CALCIUM: 8.6 mg/dL — AB (ref 8.9–10.3)
CO2: 23 mmol/L (ref 22–32)
CREATININE: 1.51 mg/dL — AB (ref 0.61–1.24)
Chloride: 101 mmol/L (ref 101–111)
GFR calc non Af Amer: 52 mL/min — ABNORMAL LOW (ref >60)
Glucose, Bld: 145 mg/dL — ABNORMAL HIGH (ref 65–99)
Potassium: 3.9 mmol/L (ref 3.5–5.1)
SODIUM: 135 mmol/L (ref 135–145)

## 2018-01-02 LAB — TROPONIN I: TROPONIN I: 0.04 ng/mL — AB (ref <0.03)

## 2018-01-02 LAB — GLUCOSE, CAPILLARY
GLUCOSE-CAPILLARY: 200 mg/dL — AB (ref 65–99)
Glucose-Capillary: 126 mg/dL — ABNORMAL HIGH (ref 65–99)

## 2018-01-02 LAB — TSH: TSH: 4.922 u[IU]/mL — ABNORMAL HIGH (ref 0.350–4.500)

## 2018-01-02 MED ORDER — SODIUM CHLORIDE 0.9% FLUSH: 3.0000 mL | INTRAVENOUS | Status: DC | PRN

## 2018-01-02 MED ORDER — SODIUM CHLORIDE 0.9 % IV SOLN
1000.0000 mL | Freq: Once | INTRAVENOUS | Status: AC
  Administered 2018-01-02: 1000 mL via INTRAVENOUS

## 2018-01-02 MED ORDER — ATORVASTATIN CALCIUM 20 MG PO TABS
80.0000 mg | ORAL_TABLET | Freq: Every day | ORAL | Status: DC
  Filled 2018-01-02: qty 4

## 2018-01-02 MED ORDER — ONDANSETRON HCL 4 MG/2ML IJ SOLN
4.0000 mg | Freq: Four times a day (QID) | INTRAMUSCULAR | Status: DC | PRN
  Filled 2018-01-02: qty 2

## 2018-01-02 MED ORDER — ASPIRIN EC 81 MG PO TBEC
81.0000 mg | DELAYED_RELEASE_TABLET | Freq: Every day | ORAL | Status: DC
  Filled 2018-01-02: qty 1

## 2018-01-02 MED ORDER — IOPAMIDOL (ISOVUE-300) INJECTION 61%
30.0000 mL | Freq: Once | INTRAVENOUS | Status: AC | PRN
  Administered 2018-01-02: 30 mL via ORAL

## 2018-01-02 MED ORDER — LISINOPRIL 5 MG PO TABS: 2.5000 mg | ORAL_TABLET | Freq: Every day | ORAL | Status: DC

## 2018-01-02 MED ORDER — SODIUM CHLORIDE 0.9% FLUSH
3.0000 mL | Freq: Two times a day (BID) | INTRAVENOUS | Status: DC
  Administered 2018-01-03 – 2018-01-09 (×11): 3 mL via INTRAVENOUS

## 2018-01-02 MED ORDER — PANTOPRAZOLE SODIUM 40 MG PO TBEC
40.0000 mg | DELAYED_RELEASE_TABLET | Freq: Every day | ORAL | Status: DC
  Filled 2018-01-02: qty 1

## 2018-01-02 MED ORDER — ENOXAPARIN SODIUM 40 MG/0.4ML ~~LOC~~ SOLN
40.0000 mg | SUBCUTANEOUS | Status: DC
  Administered 2018-01-02: 40 mg via SUBCUTANEOUS
  Filled 2018-01-02: qty 0.4

## 2018-01-02 MED ORDER — BISACODYL 5 MG PO TBEC: 5.0000 mg | DELAYED_RELEASE_TABLET | Freq: Every day | ORAL | Status: DC | PRN

## 2018-01-02 MED ORDER — SUCRALFATE 1 G PO TABS
1.0000 g | ORAL_TABLET | Freq: Three times a day (TID) | ORAL | Status: DC
  Filled 2018-01-02 (×2): qty 1

## 2018-01-02 MED ORDER — DILTIAZEM HCL 60 MG PO TABS
60.0000 mg | ORAL_TABLET | Freq: Three times a day (TID) | ORAL | Status: DC
  Administered 2018-01-02: 60 mg via ORAL
  Filled 2018-01-02: qty 1

## 2018-01-02 MED ORDER — METFORMIN HCL 500 MG PO TABS
1000.0000 mg | ORAL_TABLET | Freq: Two times a day (BID) | ORAL | Status: DC
  Filled 2018-01-02 (×2): qty 2

## 2018-01-02 MED ORDER — SENNOSIDES-DOCUSATE SODIUM 8.6-50 MG PO TABS: 1.0000 | ORAL_TABLET | Freq: Every evening | ORAL | Status: DC | PRN

## 2018-01-02 MED ORDER — ACETAMINOPHEN 650 MG RE SUPP
650.0000 mg | Freq: Four times a day (QID) | RECTAL | Status: DC | PRN
Start: 2018-01-02 — End: 2018-01-21
  Administered 2018-01-19: 650 mg via RECTAL
  Filled 2018-01-02: qty 1

## 2018-01-02 MED ORDER — SODIUM CHLORIDE 0.9 % IV SOLN: 250.0000 mL | INTRAVENOUS | Status: DC | PRN

## 2018-01-02 MED ORDER — ACETAMINOPHEN 325 MG PO TABS
650.0000 mg | ORAL_TABLET | Freq: Four times a day (QID) | ORAL | Status: DC | PRN
  Administered 2018-01-10 – 2018-01-14 (×8): 650 mg via ORAL
  Filled 2018-01-02 (×8): qty 2

## 2018-01-02 MED ORDER — INSULIN ASPART 100 UNIT/ML ~~LOC~~ SOLN
0.0000 [IU] | Freq: Three times a day (TID) | SUBCUTANEOUS | Status: DC
  Administered 2018-01-03: 2 [IU] via SUBCUTANEOUS
  Administered 2018-01-03: 3 [IU] via SUBCUTANEOUS
  Administered 2018-01-04 (×2): 2 [IU] via SUBCUTANEOUS
  Filled 2018-01-02 (×3): qty 1

## 2018-01-02 MED ORDER — INSULIN ASPART 100 UNIT/ML ~~LOC~~ SOLN
0.0000 [IU] | Freq: Every day | SUBCUTANEOUS | Status: DC
  Filled 2018-01-02: qty 1

## 2018-01-02 MED ORDER — ONDANSETRON HCL 4 MG PO TABS: 4.0000 mg | ORAL_TABLET | Freq: Four times a day (QID) | ORAL | Status: DC | PRN

## 2018-01-02 MED ORDER — DILTIAZEM HCL-DEXTROSE 100-5 MG/100ML-% IV SOLN (PREMIX)
5.0000 mg/h | INTRAVENOUS | Status: DC
  Administered 2018-01-02: 5 mg/h via INTRAVENOUS
  Administered 2018-01-02 – 2018-01-03 (×2): 15 mg/h via INTRAVENOUS
  Filled 2018-01-02 (×2): qty 100

## 2018-01-02 MED ORDER — CLOMIPRAMINE HCL 25 MG PO CAPS
100.0000 mg | ORAL_CAPSULE | Freq: Every day | ORAL | Status: DC
  Administered 2018-01-02: 100 mg via ORAL
  Filled 2018-01-02 (×2): qty 4

## 2018-01-02 MED ORDER — DILTIAZEM HCL 60 MG PO TABS
ORAL_TABLET | ORAL | Status: AC
  Filled 2018-01-02: qty 1

## 2018-01-02 MED ORDER — IOPAMIDOL (ISOVUE-300) INJECTION 61%
100.0000 mL | Freq: Once | INTRAVENOUS | Status: AC | PRN
  Administered 2018-01-02: 100 mL via INTRAVENOUS

## 2018-01-02 NOTE — ED Notes (Signed)
Patient transported to X-ray 

## 2018-01-02 NOTE — ED Notes (Signed)
Patient transported to CT 

## 2018-01-02 NOTE — ED Triage Notes (Signed)
Pt reports went to see cardiologist this am and was advised to come to the ED for an irregular heart rate and weight gain. Pt reports SOB with laying

## 2018-01-02 NOTE — Progress Notes (Signed)
Advanced care plan. Purpose of the Encounter: CODE STATUS Parties in Attendance: Patient Patient's Decision Capacity: Good Subjective/Patient's story: Presented to the emergency room for abdominal distention bloating Objective/Medical story Has tachyarrhythmia and abdominal discomfort Goals of care determination:  Advance care directives and goals of care discussed Patient wants everything done for now and is full resuscitation CODE STATUS: Full code Time spent discussing advanced care planning: 16 minutes

## 2018-01-02 NOTE — ED Provider Notes (Signed)
[facility] Emergency Department Provider Note   ____________________________________________    I have reviewed the triage vital signs and the nursing notes.   HISTORY  Chief Complaint Chest Pain and Irregular Heart Beat     HPI Ryan Webb is a 52 y.o. male with a history of diabetes, cardiomyopathy, coronary artery disease who presents with complaints of shortness of breath and rapid heart rate.  Patient reports over the last several weeks he has had an elevated heart rate, this was first discovered at routine doctor's visit.  He also reports shortness of breath when he lies flat.  Told to come to the emergency department for evaluation.  He has increased his fluid pills but this has not helped his symptoms.  Complains of abdominal distention and fluid buildup.  Some swelling in the ankles.  No fevers or chills.  Denies chest pain to me   Past Medical History:  Diagnosis Date  . Diabetes mellitus without complication (HCC)   . Hypertension     Patient Active Problem List   Diagnosis Date Noted  . Tachyarrhythmia Jan 19, 2018  . Diabetes mellitus without complication (HCC) 01/23/2017  . Cardiomyopathy (HCC) 01/17/2016  . Coronary artery disease 01/17/2016  . Breathlessness on exertion 01/17/2016  . Acid reflux 01/17/2016  . H/O: HTN (hypertension) 01/17/2016  . 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 Prostatitis 01/17/2016    Past Surgical History:  Procedure Laterality Date  . CHOLECYSTECTOMY  2000    Prior to Admission medications   Medication Sig Start Date End Date Taking? Authorizing Provider  aspirin EC 81 MG tablet Take 81 mg by mouth daily.   Yes [provider]  atorvastatin (LIPITOR) 80 MG tablet Take 1 tablet (80 mg total) by mouth daily. 09/30/17  Yes Anola Gurney, PA  carvedilol (COREG) 3.125 MG tablet Take 3.125 mg by mouth 2 (two) times daily with a meal.  11/19/16  Yes [provider]  clomiPRAMINE (ANAFRANIL) 50 MG capsule TAKE 2 CAPSULES BY MOUTH AT BEDTIME 12/09/17  Yes Chauvin, Molly Maduro, PA  furosemide (LASIX) 20 MG tablet Take 20 mg by mouth daily.  11/19/16  Yes [provider]  lansoprazole (PREVACID) 30 MG capsule Take 1 capsule (30 mg total) by mouth daily at 12 noon. 12/19/17  Yes Anola Gurney, PA  lisinopril (PRINIVIL,ZESTRIL) 2.5 MG tablet Take 2.5 mg by mouth daily.  11/19/16  Yes [provider]  metFORMIN (GLUCOPHAGE) 1000 MG tablet Take 1 tablet (1,000 mg total) by mouth 2 (two) times daily with a meal. 12/19/17  Yes Anola Gurney, PA  metoprolol tartrate (LOPRESSOR) 25 MG tablet Take 25 mg by mouth 2 (two) times daily. 12/23/17  Yes [provider]  sucralfate (CARAFATE) 1 g tablet Take 1 tablet (1 g total) by mouth 4 (four) times daily -  with meals and at bedtime. 12/19/17  Yes Anola Gurney, PA  verapamil (CALAN) 40 MG tablet Take 40 mg by mouth 2 (two) times daily. 12/25/17  Yes [provider]  glucose blood test strip Check sugar twice daily 01/23/17   Anola Gurney, PA     Allergies Patient has no known allergies.  Family History  Problem Relation Age of Onset  . COPD Father   . Diabetes Father   . Diabetes Mother     Social History Social History   Tobacco Use  . Smoking status: Never Smoker  . Smokeless tobacco: Never Used  Substance Use Topics  . Alcohol use:  No  . Drug use: No    Review of Systems  Constitutional: No fever/chills Eyes: No visual changes.  ENT: No sore throat. Cardiovascular: Denies chest pain. Respiratory: Shortness of breath because of abdominal distention Gastrointestinal: Abdominal swelling, weight gain Genitourinary: Negative for dysuria. Musculoskeletal: Negative for back pain. Skin: Negative for rash. Neurological: Negative for headaches   ____________________________________________   PHYSICAL EXAM:  VITAL SIGNS: ED Triage Vitals  Enc Vitals Group     BP  12/26/2017 1101 (!) 138/94     Pulse Rate 12/26/2017 1101 (!) 138     Resp 12/30/2017 1101 (!) 24     Temp 12/20/2017 1101 98.8 F (37.1 C)     Temp Source 12/29/2017 1101 Oral     SpO2 12/27/2017 1101 98 %     Weight 01/03/2018 1100 122.5 kg (270 lb)     Height 01/06/2018 1100 1.753 m (5\' 9" )     Head Circumference --      Peak Flow --      Pain Score 12/23/2017 1100 0     Pain Loc --      Pain Edu? --      Excl. in GC? --     Constitutional: Alert and oriented. Pleasant and interactive Eyes: Conjunctivae are normal.   Nose: No congestion/rhinnorhea. Mouth/Throat: Mucous membranes are moist.    Cardiovascular: Tachycardia, regular rhythm. Grossly normal heart sounds.  Good peripheral circulation. Respiratory: Normal respiratory effort.  No retractions. Lungs CTAB.   Gastrointestinal: Soft and nontender.  Significant distention, no fluid wave   Musculoskeletal:  Warm and well perfused Neurologic:  Normal speech and language. No gross focal neurologic deficits are appreciated.  Skin:  Skin is warm, dry and intact. No rash noted. Psychiatric: Mood and affect are normal. Speech and behavior are normal.  ____________________________________________   LABS (all labs ordered are listed, but only abnormal results are displayed)  Labs Reviewed  BASIC METABOLIC PANEL - Abnormal; Notable for the following components:      Result Value   Glucose, Bld 145 (*)    BUN 31 (*)    Creatinine, Ser 1.51 (*)    Calcium 8.6 (*)    GFR calc non Af Amer 52 (*)    All other components within normal limits  CBC - Abnormal; Notable for the following components:   RBC 3.54 (*)    Hemoglobin 11.7 (*)    HCT 34.9 (*)    RDW 16.7 (*)    All other components within normal limits  TROPONIN I - Abnormal; Notable for the following components:   Troponin I 0.04 (*)    All other components within normal limits  TSH   ____________________________________________  EKG  ED ECG REPORT I, Jene Everyobert Sandy Haye, the  attending physician, personally viewed and interpreted this ECG.  Date: 12/13/2017  Rhythm: Sinus tachycardia QRS Axis: normal Intervals: Right bundle branch block ST/T Wave abnormalities: Nonspecific changes   ____________________________________________  RADIOLOGY  Chest x-ray negative for pulmonary edema ____________________________________________   PROCEDURES  Procedure(s) performed: No  Procedures   Critical Care performed: no      ____________________________________________   INITIAL IMPRESSION / ASSESSMENT AND PLAN / ED COURSE  Pertinent labs & imaging results that were available during my care of the patient were reviewed by me and considered in my medical decision making (see chart for details).  Patient presents with shortness of breath likely related to abdominal distention.  No rales heard on exam however pulmonary edema certainly a consideration  given his shortness of breath with lying flat.  Only mild lower extremity edema.  Will check labs, chest x-ray  Chest x-ray negative for pulmonary edema.  Given abdominal distention will obtain CT abdomen pelvis to evaluate for possible ascites, malignancy  CT abdomen pelvis reassuring, patient with continued tachycardia, will start IV fluids, add on TSH and admit to the hospital service    ____________________________________________   FINAL CLINICAL IMPRESSION(S) / ED DIAGNOSES  Final diagnoses:  Tachyarrhythmia  Shortness of breath        Note:  This document was prepared using Dragon voice recognition software and may include unintentional dictation errors.    Jene Every, MD 01-19-18 [phone]

## 2018-01-02 NOTE — H&P (Addendum)
[facility] [place] [facility] [employer]   PATIENT NAME: Ryan Webb    MR#:  [id number]  DATE OF BIRTH:  06/28/1966  DATE OF ADMISSION:  01-26-2018  PRIMARY CARE PHYSICIAN: Anola Gurney, PA   REQUESTING/REFERRING PHYSICIAN:   CHIEF COMPLAINT:   Chief Complaint  Patient presents with  . Chest Pain  . Irregular Heart Beat    HISTORY OF PRESENT ILLNESS: Ryan Webb  is a 52 y.o. male with a known history of habitus mellitus type II, hypertension presented to the emergency room with abdominal discomfort and bloating.  This started couple of days ago.  Patient also has difficulty taking a deep breath because of the abdominal bloating.  Last bowel movement was couple of days ago.  Patient was found to be tachycardic when he presented to the emergency room.  He has been evaluated for tachycardia by Dr. Lady Gary from cardiology and has been prescribed initially metoprolol patient could not tolerate the metoprolol and he was started on verapamil.  Patient continues to be tachycardic even in the emergency room with a heart rate of 130 bpm.  He was worked up with CT abdomen which showed no obstruction or any acute pathology.  No complaints of any chest pain per se.  Hospitalist service was consulted for further care.  PAST MEDICAL HISTORY:   Past Medical History:  Diagnosis Date  . Diabetes mellitus without complication (HCC)   . Hypertension     PAST SURGICAL HISTORY:  Past Surgical History:  Procedure Laterality Date  . CHOLECYSTECTOMY  2000    SOCIAL HISTORY:  Social History   Tobacco Use  . Smoking status: Never Smoker  . Smokeless tobacco: Never Used  Substance Use Topics  . Alcohol use: No    FAMILY HISTORY:  Family History  Problem Relation Age of Onset  . COPD Father   . Diabetes Father   . Diabetes Mother     DRUG ALLERGIES: No Known Allergies  REVIEW OF SYSTEMS:   CONSTITUTIONAL: No fever, fatigue or weakness.  EYES: No blurred or double  vision.  EARS, NOSE, AND THROAT: No tinnitus or ear pain.  RESPIRATORY: No cough, has shortness of breath,  No wheezing or hemoptysis.  CARDIOVASCULAR: No chest pain, orthopnea, edema.  GASTROINTESTINAL: No nausea, vomiting, diarrhea  has abdominal pain.  GENITOURINARY: No dysuria, hematuria.  ENDOCRINE: No polyuria, nocturia,  HEMATOLOGY: No anemia, easy bruising or bleeding SKIN: No rash or lesion. MUSCULOSKELETAL: No joint pain or arthritis.   NEUROLOGIC: No tingling, numbness, weakness.  PSYCHIATRY: No anxiety or depression.   MEDICATIONS AT HOME:  Prior to Admission medications   Medication Sig Start Date End Date Taking? Authorizing Provider  aspirin EC 81 MG tablet Take 81 mg by mouth daily.   Yes [provider]  atorvastatin (LIPITOR) 80 MG tablet Take 1 tablet (80 mg total) by mouth daily. 09/30/17  Yes Anola Gurney, PA  carvedilol (COREG) 3.125 MG tablet Take 3.125 mg by mouth 2 (two) times daily with a meal.  11/19/16  Yes [provider]  clomiPRAMINE (ANAFRANIL) 50 MG capsule TAKE 2 CAPSULES BY MOUTH AT BEDTIME 12/09/17  Yes Chauvin, Molly Maduro, PA  furosemide (LASIX) 20 MG tablet Take 20 mg by mouth daily.  11/19/16  Yes [provider]  lansoprazole (PREVACID) 30 MG capsule Take 1 capsule (30 mg total) by mouth daily at 12 noon. 12/19/17  Yes Anola Gurney, PA  lisinopril (PRINIVIL,ZESTRIL) 2.5 MG tablet Take 2.5 mg by mouth daily.  11/19/16  Yes [provider]  metFORMIN (GLUCOPHAGE) 1000 MG tablet Take 1 tablet (1,000 mg total) by mouth 2 (two) times daily with a meal. 12/19/17  Yes Anola Gurneyhauvin, Robert, PA  metoprolol tartrate (LOPRESSOR) 25 MG tablet Take 25 mg by mouth 2 (two) times daily. 12/23/17  Yes [provider]  sucralfate (CARAFATE) 1 g tablet Take 1 tablet (1 g total) by mouth 4 (four) times daily -  with meals and at bedtime. 12/19/17  Yes Anola Gurneyhauvin, Robert, PA  verapamil (CALAN) 40 MG tablet Take 40 mg by mouth 2 (two) times daily.  12/25/17  Yes [provider]  glucose blood test strip Check sugar twice daily 01/23/17   Anola Gurneyhauvin, Robert, PA      PHYSICAL EXAMINATION:   VITAL SIGNS: Blood pressure (!) 138/94, pulse (!) 138, temperature 98.8 F (37.1 C), temperature source Oral, resp. rate (!) 24, height 5\' 9"  (1.753 m), weight 122.5 kg (270 lb), SpO2 98 %.  GENERAL:  52 y.o.-year-old patient lying in the bed with no acute distress.  EYES: Pupils equal, round, reactive to light and accommodation. No scleral icterus. Extraocular muscles intact.  HEENT: Head atraumatic, normocephalic. Oropharynx and nasopharynx clear.  NECK:  Supple, no jugular venous distention. No thyroid enlargement, no tenderness.  LUNGS: Normal breath sounds bilaterally, no wheezing, rales,rhonchi or crepitation. No use of accessory muscles of respiration.  CARDIOVASCULAR: S1, S2 tachycardia noted. No murmurs, rubs, or gallops.  ABDOMEN: Soft, nontender, nondistended. Bowel sounds present. No organomegaly or mass.  EXTREMITIES: No pedal edema, cyanosis, or clubbing.  NEUROLOGIC: Cranial nerves II through XII are intact. Muscle strength 5/5 in all extremities. Sensation intact. Gait not checked.  PSYCHIATRIC: The patient is alert and oriented x 3.  SKIN: No obvious rash, lesion, or ulcer.   LABORATORY PANEL:   CBC Recent Labs  Lab October 10, 2017 1152  WBC 8.9  HGB 11.7*  HCT 34.9*  PLT 304  MCV 98.6  MCH 33.1  MCHC 33.6  RDW 16.7*   ------------------------------------------------------------------------------------------------------------------  Chemistries  Recent Labs  Lab October 10, 2017 1152  NA 135  K 3.9  CL 101  CO2 23  GLUCOSE 145*  BUN 31*  CREATININE 1.51*  CALCIUM 8.6*   ------------------------------------------------------------------------------------------------------------------ estimated creatinine clearance is 74.8 mL/min (A) (by C-G formula based on SCr of 1.51 mg/dL  (H)). ------------------------------------------------------------------------------------------------------------------ No results for input(s): TSH, T4TOTAL, T3FREE, THYROIDAB in the last 72 hours.  Invalid input(s): FREET3   Coagulation profile No results for input(s): INR, PROTIME in the last 168 hours. ------------------------------------------------------------------------------------------------------------------- No results for input(s): DDIMER in the last 72 hours. -------------------------------------------------------------------------------------------------------------------  Cardiac Enzymes Recent Labs  Lab October 10, 2017 1152  TROPONINI 0.04*   ------------------------------------------------------------------------------------------------------------------ Invalid input(s): POCBNP  ---------------------------------------------------------------------------------------------------------------  Urinalysis No results found for: COLORURINE, APPEARANCEUR, LABSPEC, PHURINE, GLUCOSEU, HGBUR, BILIRUBINUR, KETONESUR, PROTEINUR, UROBILINOGEN, NITRITE, LEUKOCYTESUR   RADIOLOGY: Dg Chest 2 View  Result Date: Jun 29, 2018 CLINICAL DATA:  Tachycardia and shortness of breath EXAM: CHEST - 2 VIEW COMPARISON:  10/16/2013 FINDINGS: The heart size and mediastinal contours are within normal limits. Both lungs are clear. The visualized skeletal structures are unremarkable. IMPRESSION: No acute abnormality noted. Electronically Signed   By: Alcide CleverMark  Lukens M.D.   On: 0Dec 16, 2019 11:40   Ct Abdomen Pelvis W Contrast  Result Date: Jun 29, 2018 CLINICAL DATA:  Abdominal distension EXAM: CT ABDOMEN AND PELVIS WITH CONTRAST TECHNIQUE: Multidetector CT imaging of the abdomen and pelvis was performed using the standard protocol following bolus administration of intravenous contrast. CONTRAST:  100mL ISOVUE-300 IOPAMIDOL (ISOVUE-300) INJECTION  61% COMPARISON:  None. FINDINGS: Lower chest: Small right pleural  effusion is noted. No focal infiltrate is identified. Hepatobiliary: Diffuse decreased attenuation in the liver is noted consistent with fatty infiltration. The gallbladder has been surgically removed. Pancreas: Unremarkable. No pancreatic ductal dilatation or surrounding inflammatory changes. Spleen: Normal in size without focal abnormality. Adrenals/Urinary Tract: Adrenal glands are within normal limits. The kidneys demonstrate a nonobstructing right lower pole renal stone. No obstructive changes are seen. No ureteral stones are noted. The bladder is well distended. Stomach/Bowel: Scattered diverticular changes noted. No evidence of diverticulitis is seen. No obstructive or inflammatory changes are noted. The appendix is within normal limits. Vascular/Lymphatic: No significant vascular findings are present. No enlarged abdominal or pelvic lymph nodes. Reproductive: Prostate is unremarkable. Other: No abdominal wall hernia or abnormality. No abdominopelvic ascites. Musculoskeletal: Degenerative changes of lumbar spine are noted. IMPRESSION: Tiny nonobstructing right lower pole renal stone. Small right-sided pleural effusion. No other focal abnormality is noted to correspond with the patient's given clinical history. Electronically Signed   By: Alcide Clever M.D.   On: 01-15-2018 13:30    EKG: Orders placed or performed during the hospital encounter of Jan 15, 2018  . EKG 12-Lead  . EKG 12-Lead  . ED EKG within 10 minutes  . ED EKG within 10 minutes    IMPRESSION AND PLAN:  52 year old male patient with history of hypertension, diabetes mellitus type 2 presented to the emergency room with abdominal bloating  -Persistent tachycardia Cardiology evaluation Check echocardiogram Oral Cardizem for rate control Patient does not tolerate metoprolol  -Diabetes mellitus type 2 uncontrolled Diabetic diet with sliding scale coverage with insulin Resume metformin  -Mildly elevated troponin Cycle troponin to  rule out any ischemia Could be secondary to tachycardia  -Hypertension Controlled blood pressure with oral Cardizem  -DVT prophylaxis subcu Lovenox daily  All the records are reviewed and case discussed with ED provider. Management plans discussed with the patient, family and they are in agreement.  CODE STATUS:Full code    TOTAL TIME TAKING CARE OF THIS PATIENT: 50 minutes.    Ihor Austin M.D on Jan 15, 2018 at 2:39 PM  Between 7am to 6pm - Pager - [phone]  After 6pm go to [email or web address] - password [email or web address] [facility]  [place] [facility]  Office  [phone]  CC: Primary care physician; Anola Gurney, [place]

## 2018-01-02 NOTE — Progress Notes (Addendum)
Pt here from ED A&Ox4, for elevated HR, ST 130's, no complains of pain, Dr. Tobi BastosPyreddy notified received orders to d/c po cardizem and start cardizem gtt. Will pass on to oncoming nurse and continue to monitor.

## 2018-01-03 ENCOUNTER — Observation Stay: Payer: BLUE CROSS/BLUE SHIELD

## 2018-01-03 ENCOUNTER — Observation Stay
Admit: 2018-01-03 | Discharge: 2018-01-03 | Disposition: A | Discharge status: Home / Self Care | Payer: BLUE CROSS/BLUE SHIELD | Attending: Internal Medicine | Admitting: Internal Medicine

## 2018-01-03 DIAGNOSIS — J9600 Acute respiratory failure, unspecified whether with hypoxia or hypercapnia: Secondary | ICD-10-CM | POA: Diagnosis present

## 2018-01-03 DIAGNOSIS — J9601 Acute respiratory failure with hypoxia: Secondary | ICD-10-CM | POA: Diagnosis present

## 2018-01-03 DIAGNOSIS — K567 Ileus, unspecified: Secondary | ICD-10-CM | POA: Diagnosis not present

## 2018-01-03 DIAGNOSIS — K7200 Acute and subacute hepatic failure without coma: Secondary | ICD-10-CM | POA: Diagnosis not present

## 2018-01-03 DIAGNOSIS — R0602 Shortness of breath: Secondary | ICD-10-CM | POA: Diagnosis present

## 2018-01-03 DIAGNOSIS — R402113 Coma scale, eyes open, never, at hospital admission: Secondary | ICD-10-CM | POA: Diagnosis not present

## 2018-01-03 DIAGNOSIS — R579 Shock, unspecified: Secondary | ICD-10-CM

## 2018-01-03 DIAGNOSIS — N170 Acute kidney failure with tubular necrosis: Secondary | ICD-10-CM | POA: Diagnosis not present

## 2018-01-03 DIAGNOSIS — R4182 Altered mental status, unspecified: Secondary | ICD-10-CM | POA: Diagnosis not present

## 2018-01-03 DIAGNOSIS — I429 Cardiomyopathy, unspecified: Secondary | ICD-10-CM | POA: Diagnosis present

## 2018-01-03 DIAGNOSIS — I2510 Atherosclerotic heart disease of native coronary artery without angina pectoris: Secondary | ICD-10-CM | POA: Diagnosis present

## 2018-01-03 DIAGNOSIS — E872 Acidosis: Secondary | ICD-10-CM

## 2018-01-03 DIAGNOSIS — Z7189 Other specified counseling: Secondary | ICD-10-CM | POA: Diagnosis not present

## 2018-01-03 DIAGNOSIS — N179 Acute kidney failure, unspecified: Secondary | ICD-10-CM | POA: Diagnosis not present

## 2018-01-03 DIAGNOSIS — J9602 Acute respiratory failure with hypercapnia: Secondary | ICD-10-CM | POA: Diagnosis present

## 2018-01-03 DIAGNOSIS — J181 Lobar pneumonia, unspecified organism: Secondary | ICD-10-CM | POA: Diagnosis not present

## 2018-01-03 DIAGNOSIS — J9690 Respiratory failure, unspecified, unspecified whether with hypoxia or hypercapnia: Secondary | ICD-10-CM

## 2018-01-03 DIAGNOSIS — R6521 Severe sepsis with septic shock: Secondary | ICD-10-CM | POA: Diagnosis present

## 2018-01-03 DIAGNOSIS — Y95 Nosocomial condition: Secondary | ICD-10-CM | POA: Diagnosis not present

## 2018-01-03 DIAGNOSIS — G9340 Encephalopathy, unspecified: Secondary | ICD-10-CM | POA: Diagnosis not present

## 2018-01-03 DIAGNOSIS — R400 Somnolence: Secondary | ICD-10-CM | POA: Diagnosis not present

## 2018-01-03 DIAGNOSIS — G9341 Metabolic encephalopathy: Secondary | ICD-10-CM | POA: Diagnosis not present

## 2018-01-03 DIAGNOSIS — I4891 Unspecified atrial fibrillation: Secondary | ICD-10-CM

## 2018-01-03 DIAGNOSIS — Z6841 Body Mass Index (BMI) 40.0 and over, adult: Secondary | ICD-10-CM | POA: Diagnosis not present

## 2018-01-03 DIAGNOSIS — T383X5A Adverse effect of insulin and oral hypoglycemic [antidiabetic] drugs, initial encounter: Secondary | ICD-10-CM | POA: Diagnosis present

## 2018-01-03 DIAGNOSIS — I5022 Chronic systolic (congestive) heart failure: Secondary | ICD-10-CM | POA: Diagnosis present

## 2018-01-03 DIAGNOSIS — Z01818 Encounter for other preprocedural examination: Secondary | ICD-10-CM | POA: Diagnosis not present

## 2018-01-03 DIAGNOSIS — D65 Disseminated intravascular coagulation [defibrination syndrome]: Secondary | ICD-10-CM | POA: Diagnosis not present

## 2018-01-03 DIAGNOSIS — E870 Hyperosmolality and hypernatremia: Secondary | ICD-10-CM | POA: Diagnosis not present

## 2018-01-03 DIAGNOSIS — R402213 Coma scale, best verbal response, none, at hospital admission: Secondary | ICD-10-CM | POA: Diagnosis not present

## 2018-01-03 DIAGNOSIS — R740 Nonspecific elevation of levels of transaminase and lactic acid dehydrogenase [LDH]: Secondary | ICD-10-CM | POA: Diagnosis not present

## 2018-01-03 DIAGNOSIS — R402313 Coma scale, best motor response, none, at hospital admission: Secondary | ICD-10-CM | POA: Diagnosis not present

## 2018-01-03 DIAGNOSIS — E871 Hypo-osmolality and hyponatremia: Secondary | ICD-10-CM | POA: Diagnosis not present

## 2018-01-03 DIAGNOSIS — R000 Tachycardia, unspecified: Secondary | ICD-10-CM | POA: Diagnosis not present

## 2018-01-03 DIAGNOSIS — Z66 Do not resuscitate: Secondary | ICD-10-CM | POA: Diagnosis not present

## 2018-01-03 DIAGNOSIS — I4892 Unspecified atrial flutter: Secondary | ICD-10-CM | POA: Diagnosis present

## 2018-01-03 DIAGNOSIS — Z515 Encounter for palliative care: Secondary | ICD-10-CM | POA: Diagnosis not present

## 2018-01-03 DIAGNOSIS — A419 Sepsis, unspecified organism: Secondary | ICD-10-CM | POA: Diagnosis present

## 2018-01-03 DIAGNOSIS — Z9911 Dependence on respirator [ventilator] status: Secondary | ICD-10-CM | POA: Diagnosis not present

## 2018-01-03 DIAGNOSIS — K759 Inflammatory liver disease, unspecified: Secondary | ICD-10-CM | POA: Diagnosis not present

## 2018-01-03 LAB — URINALYSIS, ROUTINE W REFLEX MICROSCOPIC
BILIRUBIN URINE: NEGATIVE
Glucose, UA: 50 mg/dL — AB
KETONES UR: NEGATIVE mg/dL
Leukocytes, UA: NEGATIVE
Nitrite: NEGATIVE
PROTEIN: 100 mg/dL — AB
Specific Gravity, Urine: 1.044 — ABNORMAL HIGH (ref 1.005–1.030)
pH: 5 (ref 5.0–8.0)

## 2018-01-03 LAB — EXPECTORATED SPUTUM ASSESSMENT W REFEX TO RESP CULTURE

## 2018-01-03 LAB — COMPREHENSIVE METABOLIC PANEL
ALK PHOS: 62 U/L (ref 38–126)
ALK PHOS: 54 U/L (ref 38–126)
ALT: 217 U/L — AB (ref 17–63)
ALT: 404 U/L — ABNORMAL HIGH (ref 17–63)
ANION GAP: 13 (ref 5–15)
AST: 240 U/L — AB (ref 15–41)
AST: 498 U/L — ABNORMAL HIGH (ref 15–41)
Albumin: 3.6 g/dL (ref 3.5–5.0)
Albumin: 2.9 g/dL — ABNORMAL LOW (ref 3.5–5.0)
Anion gap: 13 (ref 5–15)
BILIRUBIN TOTAL: 2.5 mg/dL — AB (ref 0.3–1.2)
BUN: 28 mg/dL — ABNORMAL HIGH (ref 6–20)
BUN: 31 mg/dL — ABNORMAL HIGH (ref 6–20)
CALCIUM: 7.9 mg/dL — AB (ref 8.9–10.3)
CALCIUM: 6 mg/dL — AB (ref 8.9–10.3)
CO2: 17 mmol/L — ABNORMAL LOW (ref 22–32)
CO2: 20 mmol/L — ABNORMAL LOW (ref 22–32)
CREATININE: 1.3 mg/dL — AB (ref 0.61–1.24)
CREATININE: 1.95 mg/dL — AB (ref 0.61–1.24)
Chloride: 103 mmol/L (ref 101–111)
Chloride: 100 mmol/L — ABNORMAL LOW (ref 101–111)
GFR calc Af Amer: 44 mL/min — ABNORMAL LOW (ref >60)
GFR, EST NON AFRICAN AMERICAN: 38 mL/min — AB (ref >60)
Glucose, Bld: 221 mg/dL — ABNORMAL HIGH (ref 65–99)
Glucose, Bld: 198 mg/dL — ABNORMAL HIGH (ref 65–99)
Potassium: 4.3 mmol/L (ref 3.5–5.1)
Potassium: 5.5 mmol/L — ABNORMAL HIGH (ref 3.5–5.1)
Sodium: 133 mmol/L — ABNORMAL LOW (ref 135–145)
Sodium: 133 mmol/L — ABNORMAL LOW (ref 135–145)
TOTAL PROTEIN: 6.7 g/dL (ref 6.5–8.1)
Total Bilirubin: 4 mg/dL — ABNORMAL HIGH (ref 0.3–1.2)
Total Protein: 5.5 g/dL — ABNORMAL LOW (ref 6.5–8.1)

## 2018-01-03 LAB — CBC
HCT: 33.4 % — ABNORMAL LOW (ref 40.0–52.0)
HEMATOCRIT: 37 % — AB (ref 40.0–52.0)
Hemoglobin: 12.2 g/dL — ABNORMAL LOW (ref 13.0–18.0)
Hemoglobin: 11 g/dL — ABNORMAL LOW (ref 13.0–18.0)
MCH: 33.5 pg (ref 26.0–34.0)
MCH: 33.2 pg (ref 26.0–34.0)
MCHC: 33 g/dL (ref 32.0–36.0)
MCHC: 32.8 g/dL (ref 32.0–36.0)
MCV: 101.4 fL — AB (ref 80.0–100.0)
MCV: 101.2 fL — AB (ref 80.0–100.0)
PLATELETS: 365 10*3/uL (ref 150–440)
PLATELETS: 213 10*3/uL (ref 150–440)
RBC: 3.65 MIL/uL — AB (ref 4.40–5.90)
RBC: 3.3 MIL/uL — AB (ref 4.40–5.90)
RDW: 17.3 % — ABNORMAL HIGH (ref 11.5–14.5)
RDW: 16.9 % — ABNORMAL HIGH (ref 11.5–14.5)
WBC: 13.2 10*3/uL — AB (ref 3.8–10.6)
WBC: 14.1 10*3/uL — ABNORMAL HIGH (ref 3.8–10.6)

## 2018-01-03 LAB — PROTIME-INR
INR: 1.3
INR: 1.95
Prothrombin Time: 16.1 seconds — ABNORMAL HIGH (ref 11.4–15.2)
Prothrombin Time: 22.1 seconds — ABNORMAL HIGH (ref 11.4–15.2)

## 2018-01-03 LAB — BLOOD GAS, ARTERIAL
ACID-BASE DEFICIT: 10.3 mmol/L — AB (ref 0.0–2.0)
ACID-BASE DEFICIT: 16.3 mmol/L — AB (ref 0.0–2.0)
Acid-base deficit: 11.5 mmol/L — ABNORMAL HIGH (ref 0.0–2.0)
Acid-base deficit: 7.8 mmol/L — ABNORMAL HIGH (ref 0.0–2.0)
Acid-base deficit: 7.3 mmol/L — ABNORMAL HIGH (ref 0.0–2.0)
Allens test (pass/fail): POSITIVE — AB
BICARBONATE: 14.2 mmol/L — AB (ref 20.0–28.0)
BICARBONATE: 20.3 mmol/L (ref 20.0–28.0)
BICARBONATE: 14.8 mmol/L — AB (ref 20.0–28.0)
BICARBONATE: 21.3 mmol/L (ref 20.0–28.0)
Bicarbonate: 17.9 mmol/L — ABNORMAL LOW (ref 20.0–28.0)
FIO2: 0.28
FIO2: 1
FIO2: 1
FIO2: 1
FIO2: 0.8
LHR: 22 {breaths}/min
MECHVT: 500 mL
MECHVT: 500 mL
MECHVT: 500 mL
Mechanical Rate: 15
O2 SAT: 99.6 %
O2 Saturation: 90.7 %
O2 Saturation: 97.9 %
O2 Saturation: 91 %
O2 Saturation: 95.1 %
PATIENT TEMPERATURE: 37
PATIENT TEMPERATURE: 37
PCO2 ART: 31 mmHg — AB (ref 32.0–48.0)
PCO2 ART: 60 mmHg — AB (ref 32.0–48.0)
PEEP/CPAP: 5 cmH2O
PEEP/CPAP: 5 cmH2O
PEEP/CPAP: 5 cmH2O
PEEP/CPAP: 5 cmH2O
PH ART: 7.27 — AB (ref 7.350–7.450)
PH ART: 7.18 — AB (ref 7.350–7.450)
PH ART: 7 — AB (ref 7.350–7.450)
PO2 ART: 69 mmHg — AB (ref 83.0–108.0)
Patient temperature: 37
Patient temperature: 37
Patient temperature: 37
RATE: 15 resp/min
RATE: 22 resp/min
RATE: 22 resp/min
VT: 500 mL
pCO2 arterial: 48 mmHg (ref 32.0–48.0)
pCO2 arterial: 52 mmHg — ABNORMAL HIGH (ref 32.0–48.0)
pCO2 arterial: 57 mmHg — ABNORMAL HIGH (ref 32.0–48.0)
pH, Arterial: 7.2 — ABNORMAL LOW (ref 7.350–7.450)
pH, Arterial: 7.18 — CL (ref 7.350–7.450)
pO2, Arterial: 220 mmHg — ABNORMAL HIGH (ref 83.0–108.0)
pO2, Arterial: 123 mmHg — ABNORMAL HIGH (ref 83.0–108.0)
pO2, Arterial: 91 mmHg (ref 83.0–108.0)
pO2, Arterial: 94 mmHg (ref 83.0–108.0)

## 2018-01-03 LAB — BRAIN NATRIURETIC PEPTIDE: B Natriuretic Peptide: 210 pg/mL — ABNORMAL HIGH (ref 0.0–100.0)

## 2018-01-03 LAB — TROPONIN I
TROPONIN I: 0.04 ng/mL — AB (ref <0.03)
Troponin I: 0.04 ng/mL (ref <0.03)
Troponin I: 0.06 ng/mL (ref <0.03)

## 2018-01-03 LAB — GLUCOSE, CAPILLARY
GLUCOSE-CAPILLARY: 206 mg/dL — AB (ref 65–99)
GLUCOSE-CAPILLARY: 195 mg/dL — AB (ref 65–99)
Glucose-Capillary: 210 mg/dL — ABNORMAL HIGH (ref 65–99)
Glucose-Capillary: 213 mg/dL — ABNORMAL HIGH (ref 65–99)
Glucose-Capillary: 165 mg/dL — ABNORMAL HIGH (ref 65–99)
Glucose-Capillary: 179 mg/dL — ABNORMAL HIGH (ref 65–99)

## 2018-01-03 LAB — RENAL FUNCTION PANEL
ALBUMIN: 3.1 g/dL — AB (ref 3.5–5.0)
ANION GAP: 14 (ref 5–15)
ANION GAP: 10 (ref 5–15)
Albumin: 2.7 g/dL — ABNORMAL LOW (ref 3.5–5.0)
BUN: 34 mg/dL — ABNORMAL HIGH (ref 6–20)
BUN: 32 mg/dL — ABNORMAL HIGH (ref 6–20)
CO2: 19 mmol/L — ABNORMAL LOW (ref 22–32)
CO2: 24 mmol/L (ref 22–32)
Calcium: 6.4 mg/dL — CL (ref 8.9–10.3)
Calcium: 6.2 mg/dL — CL (ref 8.9–10.3)
Chloride: 100 mmol/L — ABNORMAL LOW (ref 101–111)
Chloride: 101 mmol/L (ref 101–111)
Creatinine, Ser: 2.25 mg/dL — ABNORMAL HIGH (ref 0.61–1.24)
Creatinine, Ser: 2.12 mg/dL — ABNORMAL HIGH (ref 0.61–1.24)
GFR calc non Af Amer: 32 mL/min — ABNORMAL LOW (ref >60)
GFR calc non Af Amer: 34 mL/min — ABNORMAL LOW (ref >60)
GFR, EST AFRICAN AMERICAN: 37 mL/min — AB (ref >60)
GFR, EST AFRICAN AMERICAN: 40 mL/min — AB (ref >60)
Glucose, Bld: 246 mg/dL — ABNORMAL HIGH (ref 65–99)
Glucose, Bld: 216 mg/dL — ABNORMAL HIGH (ref 65–99)
PHOSPHORUS: 7.4 mg/dL — AB (ref 2.5–4.6)
PHOSPHORUS: 5.1 mg/dL — AB (ref 2.5–4.6)
POTASSIUM: 4.4 mmol/L (ref 3.5–5.1)
POTASSIUM: 3.7 mmol/L (ref 3.5–5.1)
Sodium: 133 mmol/L — ABNORMAL LOW (ref 135–145)
Sodium: 135 mmol/L (ref 135–145)

## 2018-01-03 LAB — MRSA PCR SCREENING: MRSA BY PCR: NEGATIVE

## 2018-01-03 LAB — AMYLASE: Amylase: 45 U/L (ref 28–100)

## 2018-01-03 LAB — HEPARIN LEVEL (UNFRACTIONATED)
HEPARIN UNFRACTIONATED: 0.28 [IU]/mL — AB (ref 0.30–0.70)
Heparin Unfractionated: <0.1 IU/mL — ABNORMAL LOW (ref 0.30–0.70)

## 2018-01-03 LAB — URINE DRUG SCREEN, QUALITATIVE (ARMC ONLY)
AMPHETAMINES, UR SCREEN: NOT DETECTED
BENZODIAZEPINE, UR SCRN: POSITIVE — AB
Cannabinoid 50 Ng, Ur ~~LOC~~: NOT DETECTED
Cocaine Metabolite,Ur ~~LOC~~: NOT DETECTED
MDMA (Ecstasy)Ur Screen: NOT DETECTED
Methadone Scn, Ur: NOT DETECTED
Opiate, Ur Screen: NOT DETECTED
PHENCYCLIDINE (PCP) UR S: NOT DETECTED
Tricyclic, Ur Screen: POSITIVE — AB

## 2018-01-03 LAB — ECHOCARDIOGRAM COMPLETE
HEIGHTINCHES: 69 in
Weight: 4353.6 oz

## 2018-01-03 LAB — PHOSPHORUS: Phosphorus: 6.2 mg/dL — ABNORMAL HIGH (ref 2.5–4.6)

## 2018-01-03 LAB — BASIC METABOLIC PANEL
Anion gap: 13 (ref 5–15)
BUN: 31 mg/dL — ABNORMAL HIGH (ref 6–20)
CHLORIDE: 103 mmol/L (ref 101–111)
CO2: 19 mmol/L — AB (ref 22–32)
Calcium: 6.3 mg/dL — CL (ref 8.9–10.3)
Creatinine, Ser: 1.95 mg/dL — ABNORMAL HIGH (ref 0.61–1.24)
GFR calc non Af Amer: 38 mL/min — ABNORMAL LOW (ref >60)
GFR, EST AFRICAN AMERICAN: 44 mL/min — AB (ref >60)
Glucose, Bld: 203 mg/dL — ABNORMAL HIGH (ref 65–99)
Potassium: 4.6 mmol/L (ref 3.5–5.1)
Sodium: 135 mmol/L (ref 135–145)

## 2018-01-03 LAB — LIPASE, BLOOD: Lipase: 52 U/L — ABNORMAL HIGH (ref 11–51)

## 2018-01-03 LAB — LACTIC ACID, PLASMA
LACTIC ACID, VENOUS: 2.9 mmol/L — AB (ref 0.5–1.9)
Lactic Acid, Venous: 4.2 mmol/L (ref 0.5–1.9)
Lactic Acid, Venous: 5.9 mmol/L (ref 0.5–1.9)
Lactic Acid, Venous: 7 mmol/L (ref 0.5–1.9)
Lactic Acid, Venous: 6.5 mmol/L (ref 0.5–1.9)
Lactic Acid, Venous: 4.3 mmol/L (ref 0.5–1.9)

## 2018-01-03 LAB — CK: CK TOTAL: 180 U/L (ref 49–397)

## 2018-01-03 LAB — MAGNESIUM
MAGNESIUM: 1.9 mg/dL (ref 1.7–2.4)
Magnesium: 2.1 mg/dL (ref 1.7–2.4)
Magnesium: 1.9 mg/dL (ref 1.7–2.4)

## 2018-01-03 LAB — APTT
APTT: 34 s (ref 24–36)
APTT: 42 s — AB (ref 24–36)

## 2018-01-03 LAB — AMMONIA: Ammonia: 64 umol/L — ABNORMAL HIGH (ref 9–35)

## 2018-01-03 LAB — IONIZED CALCIUM, NEONATAL

## 2018-01-03 LAB — TRIGLYCERIDES: Triglycerides: 101 mg/dL (ref <150)

## 2018-01-03 LAB — EXPECTORATED SPUTUM ASSESSMENT W GRAM STAIN, RFLX TO RESP C

## 2018-01-03 LAB — SALICYLATE LEVEL: Salicylate Lvl: <7 mg/dL (ref 2.8–30.0)

## 2018-01-03 LAB — PROCALCITONIN: Procalcitonin: 0.1 ng/mL

## 2018-01-03 LAB — ACETAMINOPHEN LEVEL

## 2018-01-03 MED ORDER — SODIUM BICARBONATE 8.4 % IV SOLN
INTRAVENOUS | Status: AC
  Administered 2018-01-03: 100 meq via INTRAVENOUS
  Filled 2018-01-03: qty 150

## 2018-01-03 MED ORDER — VANCOMYCIN HCL 10 G IV SOLR
1500.0000 mg | Freq: Once | INTRAVENOUS | Status: AC
  Administered 2018-01-03: 1500 mg via INTRAVENOUS
  Filled 2018-01-03: qty 1500

## 2018-01-03 MED ORDER — SODIUM CHLORIDE 0.45 % IV SOLN
INTRAVENOUS | Status: DC
  Filled 2018-01-03 (×3): qty 250

## 2018-01-03 MED ORDER — CISATRACURIUM BOLUS VIA INFUSION
2.5000 mg | Freq: Once | INTRAVENOUS | Status: AC
  Administered 2018-01-03: 2.5 mg via INTRAVENOUS
  Filled 2018-01-03: qty 3

## 2018-01-03 MED ORDER — NOREPINEPHRINE 4 MG/250ML-% IV SOLN
INTRAVENOUS | Status: AC
  Administered 2018-01-03: 35 ug/kg/min
  Filled 2018-01-03: qty 250

## 2018-01-03 MED ORDER — CHLORHEXIDINE GLUCONATE 0.12% ORAL RINSE (MEDLINE KIT)
15.0000 mL | Freq: Two times a day (BID) | OROMUCOSAL | Status: DC
  Administered 2018-01-03 – 2018-01-21 (×37): 15 mL via OROMUCOSAL

## 2018-01-03 MED ORDER — MIDAZOLAM HCL 2 MG/2ML IJ SOLN
2.0000 mg | Freq: Once | INTRAMUSCULAR | Status: AC
  Administered 2018-01-03: 2 mg via INTRAVENOUS

## 2018-01-03 MED ORDER — FENTANYL CITRATE (PF) 100 MCG/2ML IJ SOLN
INTRAMUSCULAR | Status: AC
  Administered 2018-01-03: 100 ug via INTRAVENOUS
  Filled 2018-01-03: qty 2

## 2018-01-03 MED ORDER — LORAZEPAM 2 MG/ML IJ SOLN
INTRAMUSCULAR | Status: AC
  Filled 2018-01-03: qty 1

## 2018-01-03 MED ORDER — NOREPINEPHRINE 16 MG/250ML-% IV SOLN
0.0000 ug/min | INTRAVENOUS | Status: DC
  Administered 2018-01-03 (×2): 40 ug/min via INTRAVENOUS
  Administered 2018-01-04: 30 ug/min via INTRAVENOUS
  Administered 2018-01-05: 8 ug/min via INTRAVENOUS
  Administered 2018-01-06: 2 ug/min via INTRAVENOUS
  Filled 2018-01-03 (×5): qty 250

## 2018-01-03 MED ORDER — ACETYLCYSTEINE 600 MG PO TBCR
1200.0000 mg | EXTENDED_RELEASE_TABLET | Freq: Two times a day (BID) | ORAL | Status: DC
  Filled 2018-01-03: qty 2

## 2018-01-03 MED ORDER — PIPERACILLIN-TAZOBACTAM 3.375 G IVPB
3.3750 g | Freq: Three times a day (TID) | INTRAVENOUS | Status: AC
  Administered 2018-01-03 – 2018-01-09 (×21): 3.375 g via INTRAVENOUS
  Filled 2018-01-03 (×21): qty 50

## 2018-01-03 MED ORDER — FENTANYL CITRATE (PF) 100 MCG/2ML IJ SOLN
100.0000 ug | Freq: Once | INTRAMUSCULAR | Status: AC
  Administered 2018-01-03: 100 ug via INTRAVENOUS

## 2018-01-03 MED ORDER — FAMOTIDINE IN NACL 20-0.9 MG/50ML-% IV SOLN
20.0000 mg | INTRAVENOUS | Status: DC
  Administered 2018-01-03 – 2018-01-06 (×4): 20 mg via INTRAVENOUS
  Filled 2018-01-03 (×3): qty 50

## 2018-01-03 MED ORDER — MIDAZOLAM HCL 2 MG/2ML IJ SOLN
INTRAMUSCULAR | Status: AC
Start: 2018-01-03 — End: 2018-01-03
  Administered 2018-01-03: 4 mg via INTRAVENOUS
  Filled 2018-01-03: qty 4

## 2018-01-03 MED ORDER — IPRATROPIUM-ALBUTEROL 0.5-2.5 (3) MG/3ML IN SOLN
3.0000 mL | Freq: Four times a day (QID) | RESPIRATORY_TRACT | Status: DC
  Administered 2018-01-03 – 2018-01-21 (×74): 3 mL via RESPIRATORY_TRACT
  Filled 2018-01-03 (×76): qty 3

## 2018-01-03 MED ORDER — MIDAZOLAM HCL 2 MG/2ML IJ SOLN
4.0000 mg | Freq: Once | INTRAMUSCULAR | Status: AC
  Administered 2018-01-03: 4 mg via INTRAVENOUS

## 2018-01-03 MED ORDER — PROPOFOL 1000 MG/100ML IV EMUL
0.0000 ug/kg/min | INTRAVENOUS | Status: DC
  Administered 2018-01-03: 20 ug/kg/min via INTRAVENOUS

## 2018-01-03 MED ORDER — MIDAZOLAM HCL 2 MG/2ML IJ SOLN
INTRAMUSCULAR | Status: AC
  Administered 2018-01-03: 2 mg via INTRAVENOUS
  Filled 2018-01-03: qty 2

## 2018-01-03 MED ORDER — FENTANYL 2500MCG IN NS 250ML (10MCG/ML) PREMIX INFUSION
INTRAVENOUS | Status: AC
  Administered 2018-01-03: 200 ug/h via INTRAVENOUS
  Filled 2018-01-03: qty 250

## 2018-01-03 MED ORDER — SODIUM BICARBONATE 8.4 % IV SOLN
INTRAVENOUS | Status: DC
  Administered 2018-01-03 – 2018-01-04 (×3): via INTRAVENOUS
  Filled 2018-01-03 (×6): qty 150

## 2018-01-03 MED ORDER — ACETYLCYSTEINE LOAD VIA INFUSION: 1200.0000 mg | Freq: Once | INTRAVENOUS | Status: DC

## 2018-01-03 MED ORDER — STERILE WATER FOR INJECTION IJ SOLN
INTRAMUSCULAR | Status: AC
  Administered 2018-01-03: 10 mL
  Filled 2018-01-03: qty 20

## 2018-01-03 MED ORDER — SODIUM CHLORIDE 0.9 % IV SOLN
0.5000 ug/kg/min | INTRAVENOUS | Status: DC
  Administered 2018-01-03: 1 ug/kg/min via INTRAVENOUS
  Administered 2018-01-03: 2 ug/kg/min via INTRAVENOUS
  Filled 2018-01-03 (×3): qty 20

## 2018-01-03 MED ORDER — SODIUM CHLORIDE 0.9 % IV SOLN
1250.0000 mg | Freq: Two times a day (BID) | INTRAVENOUS | Status: DC
  Filled 2018-01-03 (×2): qty 1250

## 2018-01-03 MED ORDER — FENTANYL 2500MCG IN NS 250ML (10MCG/ML) PREMIX INFUSION
0.0000 ug/h | INTRAVENOUS | Status: DC
Start: 2018-01-03 — End: 2018-01-12
  Administered 2018-01-03: 300 ug/h via INTRAVENOUS
  Administered 2018-01-03: 200 ug/h via INTRAVENOUS
  Administered 2018-01-04 – 2018-01-08 (×13): 350 ug/h via INTRAVENOUS
  Administered 2018-01-09 (×2): 50 ug/h via INTRAVENOUS
  Administered 2018-01-10: 150 ug/h via INTRAVENOUS
  Filled 2018-01-03 (×19): qty 250

## 2018-01-03 MED ORDER — VANCOMYCIN HCL IN DEXTROSE 1-5 GM/200ML-% IV SOLN
1000.0000 mg | INTRAVENOUS | Status: DC
  Administered 2018-01-04: 1000 mg via INTRAVENOUS
  Filled 2018-01-03: qty 200

## 2018-01-03 MED ORDER — HEPARIN SODIUM (PORCINE) 5000 UNIT/ML IJ SOLN: 5000.0000 [IU] | Freq: Three times a day (TID) | INTRAMUSCULAR | Status: DC

## 2018-01-03 MED ORDER — FAMOTIDINE IN NACL 20-0.9 MG/50ML-% IV SOLN
20.0000 mg | Freq: Two times a day (BID) | INTRAVENOUS | Status: DC
  Filled 2018-01-03: qty 50

## 2018-01-03 MED ORDER — HEPARIN (PORCINE) IN NACL 100-0.45 UNIT/ML-% IJ SOLN
1800.0000 [IU]/h | INTRAMUSCULAR | Status: DC
  Administered 2018-01-03: 1600 [IU]/h via INTRAVENOUS
  Administered 2018-01-04 – 2018-01-07 (×6): 1800 [IU]/h via INTRAVENOUS
  Filled 2018-01-03 (×6): qty 250

## 2018-01-03 MED ORDER — HEPARIN (PORCINE) IN NACL 100-0.45 UNIT/ML-% IJ SOLN
1600.0000 [IU]/h | INTRAMUSCULAR | Status: DC
  Administered 2018-01-03: 1600 [IU]/h via INTRAVENOUS
  Filled 2018-01-03: qty 250

## 2018-01-03 MED ORDER — HEPARIN SODIUM (PORCINE) 1000 UNIT/ML DIALYSIS
1000.0000 [IU] | INTRAMUSCULAR | Status: DC | PRN
  Filled 2018-01-03: qty 6

## 2018-01-03 MED ORDER — MIDAZOLAM HCL 2 MG/2ML IJ SOLN
INTRAMUSCULAR | Status: AC
  Administered 2018-01-03: 4 mg via INTRAVENOUS
  Filled 2018-01-03: qty 4

## 2018-01-03 MED ORDER — HEPARIN (PORCINE) IN NACL 100-0.45 UNIT/ML-% IJ SOLN
INTRAMUSCULAR | Status: AC
  Administered 2018-01-03: 1600 [IU]/h via INTRAVENOUS
  Filled 2018-01-03: qty 250

## 2018-01-03 MED ORDER — ALTEPLASE 2 MG IJ SOLR
2.0000 mg | Freq: Once | INTRAMUSCULAR | Status: AC
Start: 2018-01-03 — End: 2018-01-03
  Administered 2018-01-03: 2 mg
  Filled 2018-01-03: qty 2

## 2018-01-03 MED ORDER — IOPAMIDOL (ISOVUE-370) INJECTION 76%
125.0000 mL | Freq: Once | INTRAVENOUS | Status: AC | PRN
  Administered 2018-01-03: 125 mL via INTRAVENOUS

## 2018-01-03 MED ORDER — DEXTROSE 5 % IV SOLN
Freq: Once | INTRAVENOUS | Status: AC
  Administered 2018-01-03: 05:00:00 via INTRAVENOUS
  Filled 2018-01-03: qty 250

## 2018-01-03 MED ORDER — SODIUM BICARBONATE 8.4 % IV SOLN
100.0000 meq | Freq: Once | INTRAVENOUS | Status: AC
  Administered 2018-01-03: 100 meq via INTRAVENOUS

## 2018-01-03 MED ORDER — ALBUTEROL SULFATE (2.5 MG/3ML) 0.083% IN NEBU
5.0000 mg | INHALATION_SOLUTION | Freq: Once | RESPIRATORY_TRACT | Status: AC
  Administered 2018-01-03: 5 mg via RESPIRATORY_TRACT
  Filled 2018-01-03: qty 6

## 2018-01-03 MED ORDER — ALBUTEROL SULFATE (2.5 MG/3ML) 0.083% IN NEBU
2.5000 mg | INHALATION_SOLUTION | Freq: Once | RESPIRATORY_TRACT | Status: AC
  Administered 2018-01-03: 2.5 mg via RESPIRATORY_TRACT

## 2018-01-03 MED ORDER — VANCOMYCIN HCL IN DEXTROSE 1-5 GM/200ML-% IV SOLN: 1000.0000 mg | Freq: Once | INTRAVENOUS | Status: DC

## 2018-01-03 MED ORDER — DOPAMINE-DEXTROSE 3.2-5 MG/ML-% IV SOLN
INTRAVENOUS | Status: AC
  Administered 2018-01-03: 20 ug/kg/min
  Filled 2018-01-03: qty 250

## 2018-01-03 MED ORDER — LORAZEPAM 1 MG PO TABS
ORAL_TABLET | ORAL | Status: AC
  Filled 2018-01-03: qty 1

## 2018-01-03 MED ORDER — DOPAMINE-DEXTROSE 3.2-5 MG/ML-% IV SOLN
0.0000 ug/kg/min | INTRAVENOUS | Status: DC
  Administered 2018-01-03: 6 ug/kg/min via INTRAVENOUS

## 2018-01-03 MED ORDER — HEPARIN SODIUM (PORCINE) 1000 UNIT/ML DIALYSIS
1000.0000 [IU] | INTRAMUSCULAR | Status: DC | PRN
  Administered 2018-01-05: 2600 [IU] via INTRAVENOUS_CENTRAL
  Filled 2018-01-03 (×9): qty 6

## 2018-01-03 MED ORDER — SUCCINYLCHOLINE CHLORIDE 20 MG/ML IJ SOLN
INTRAMUSCULAR | Status: AC
  Filled 2018-01-03: qty 1

## 2018-01-03 MED ORDER — DIAZEPAM 5 MG PO TABS
5.0000 mg | ORAL_TABLET | Freq: Once | ORAL | Status: AC
  Administered 2018-01-03: 5 mg via ORAL
  Filled 2018-01-03: qty 1

## 2018-01-03 MED ORDER — CALCIUM GLUCONATE 10 % IV SOLN
1.0000 g | Freq: Once | INTRAVENOUS | Status: AC
  Administered 2018-01-03: 1 g via INTRAVENOUS
  Filled 2018-01-03: qty 10

## 2018-01-03 MED ORDER — CARVEDILOL 3.125 MG PO TABS
3.1250 mg | ORAL_TABLET | Freq: Two times a day (BID) | ORAL | Status: DC
  Administered 2018-01-03: 3.125 mg via ORAL
  Filled 2018-01-03: qty 1

## 2018-01-03 MED ORDER — SODIUM CHLORIDE 0.9 % IV SOLN
1.0000 g | Freq: Once | INTRAVENOUS | Status: AC
  Administered 2018-01-03: 1 g via INTRAVENOUS
  Filled 2018-01-03: qty 10

## 2018-01-03 MED ORDER — ALBUTEROL SULFATE (2.5 MG/3ML) 0.083% IN NEBU
INHALATION_SOLUTION | RESPIRATORY_TRACT | Status: AC
  Filled 2018-01-03: qty 3

## 2018-01-03 MED ORDER — ACETYLCYSTEINE 20 % IN SOLN
1200.0000 mg | Freq: Two times a day (BID) | RESPIRATORY_TRACT | Status: AC
  Administered 2018-01-04 (×2): 1200 mg via ORAL
  Filled 2018-01-03 (×6): qty 4

## 2018-01-03 MED ORDER — PROPOFOL 1000 MG/100ML IV EMUL
INTRAVENOUS | Status: AC
  Administered 2018-01-03: 20 ug/kg/min via INTRAVENOUS
  Filled 2018-01-03: qty 100

## 2018-01-03 MED ORDER — VANCOMYCIN HCL 10 G IV SOLR: 1500.0000 mg | Freq: Two times a day (BID) | INTRAVENOUS | Status: DC

## 2018-01-03 MED ORDER — POLYVINYL ALCOHOL 1.4 % OP SOLN
1.0000 [drp] | Freq: Three times a day (TID) | OPHTHALMIC | Status: DC
  Administered 2018-01-03 – 2018-01-21 (×56): 1 [drp] via OPHTHALMIC
  Filled 2018-01-03: qty 15

## 2018-01-03 MED ORDER — ORAL CARE MOUTH RINSE
15.0000 mL | OROMUCOSAL | Status: DC
  Administered 2018-01-03 – 2018-01-22 (×179): 15 mL via OROMUCOSAL

## 2018-01-03 MED ORDER — VASOPRESSIN 20 UNIT/ML IV SOLN
0.0300 [IU]/min | INTRAVENOUS | Status: DC
  Administered 2018-01-03 – 2018-01-05 (×3): 0.03 [IU]/min via INTRAVENOUS
  Filled 2018-01-03 (×5): qty 2

## 2018-01-03 MED ORDER — IOPAMIDOL (ISOVUE-370) INJECTION 76%: 100.0000 mL | Freq: Once | INTRAVENOUS | Status: DC | PRN

## 2018-01-03 MED ORDER — PUREFLOW DIALYSIS SOLUTION
INTRAVENOUS | Status: DC
  Administered 2018-01-03 – 2018-01-04 (×2): via INTRAVENOUS_CENTRAL

## 2018-01-03 MED ORDER — PIPERACILLIN-TAZOBACTAM 3.375 G IVPB 30 MIN: 3.3750 g | Freq: Three times a day (TID) | INTRAVENOUS | Status: DC

## 2018-01-03 MED ORDER — HEPARIN BOLUS VIA INFUSION
1500.0000 [IU] | Freq: Once | INTRAVENOUS | Status: AC
  Administered 2018-01-03: 1500 [IU] via INTRAVENOUS
  Filled 2018-01-03: qty 1500

## 2018-01-03 MED ORDER — THIAMINE HCL 100 MG/ML IJ SOLN
100.0000 mg | Freq: Every day | INTRAMUSCULAR | Status: DC
  Administered 2018-01-03 – 2018-01-07 (×5): 100 mg via INTRAVENOUS
  Filled 2018-01-03 (×3): qty 2

## 2018-01-03 MED ORDER — EPINEPHRINE PF 1 MG/ML IJ SOLN
0.5000 ug/min | INTRAVENOUS | Status: DC
  Administered 2018-01-03: 15 ug/min via INTRAVENOUS
  Administered 2018-01-03: 3 ug/min via INTRAVENOUS
  Filled 2018-01-03 (×2): qty 4

## 2018-01-03 MED ORDER — SODIUM CHLORIDE 0.9 % IV BOLUS
1000.0000 mL | Freq: Once | INTRAVENOUS | Status: AC
  Administered 2018-01-03: 1000 mL via INTRAVENOUS

## 2018-01-03 MED ORDER — ALTEPLASE 2 MG IJ SOLR
2.0000 mg | Freq: Once | INTRAMUSCULAR | Status: AC
  Administered 2018-01-03: 2 mg
  Filled 2018-01-03: qty 2

## 2018-01-03 MED ORDER — SODIUM CHLORIDE 0.9 % IV SOLN
0.0000 mg/h | INTRAVENOUS | Status: DC
  Administered 2018-01-03 – 2018-01-04 (×2): 2 mg/h via INTRAVENOUS
  Administered 2018-01-04: 4 mg/h via INTRAVENOUS
  Administered 2018-01-05 (×2): 8 mg/h via INTRAVENOUS
  Administered 2018-01-06 – 2018-01-07 (×5): 6 mg/h via INTRAVENOUS
  Administered 2018-01-07 – 2018-01-08 (×2): 8 mg/h via INTRAVENOUS
  Filled 2018-01-03 (×13): qty 10

## 2018-01-03 MED ORDER — PHENYLEPHRINE HCL-NACL 10-0.9 MG/250ML-% IV SOLN
0.0000 ug/min | INTRAVENOUS | Status: DC
Start: 2018-01-03 — End: 2018-01-05
  Administered 2018-01-03 (×2): 300 ug/min via INTRAVENOUS
  Filled 2018-01-03 (×4): qty 250

## 2018-01-03 NOTE — Progress Notes (Addendum)
Remains tachypneic and sitting on side of bed.  Zofran given for complaints of nausea.  Rapid response nurse Erica in to assess.  Recommended IVF be discontinued an nebulizer order requested after discussion with CCU Nurse Practitioner.

## 2018-01-03 NOTE — Procedures (Signed)
Central Venous Catheter Insertion Procedure Note Lucas Mallowony W Lipford [id number] 04/21/1966  Procedure: Insertion of Central Venous Catheter Indications: Assessment of intravascular volume, Drug and/or fluid administration and Frequent blood sampling  Procedure Details Consent: Unable to obtain consent because of emergent medical necessity. Time Out: Verified patient identification, verified procedure, site/side was marked, verified correct patient position, special equipment/implants available, medications/allergies/relevent history reviewed, required imaging and test results available.  Performed  Maximum sterile technique was used including antiseptics, cap, gloves, gown, hand hygiene, mask and sheet. Skin prep: Chlorhexidine; local anesthetic administered A antimicrobial bonded/coated triple lumen catheter was placed in the right internal jugular vein using the Seldinger technique.  Evaluation Blood flow good Complications: No apparent complications Patient did tolerate procedure well. Chest X-ray ordered to verify placement.  CXR: normal.  Procedure performed under direct supervision of Dr.Richards. Ultrasound utilized for realtime vessel cannulation  Harriett Azar S. [facility] ANP-BC Pulmonary and Critical Care Medicine [facility] Pager [phone] or [phone]  NB: This document was prepared using Dragon voice recognition software and may include unintentional dictation errors.    01/03/2018, 10:30 AM

## 2018-01-03 NOTE — Progress Notes (Signed)
MD in to see, orders entered for CT scan.  2nd IV access attempted.

## 2018-01-03 NOTE — Progress Notes (Signed)
CCU charge Alcario Droughtrica made aware of need to transfer patient.  Order requested from MD for transfer.

## 2018-01-03 NOTE — Progress Notes (Signed)
ANTICOAGULATION CONSULT NOTE - Follow Up Consult  Pharmacy Consult for Heparin Drip Indication: atrial fibrillation  No Known Allergies  Patient Measurements: Height: 5\' 9"  (175.3 cm) Weight: 272 lb 1.6 oz (123.4 kg) IBW/kg (Calculated) : 70.7 Heparin Dosing Weight: 98.9 kg  Vital Signs: Temp: 94.2 F (34.6 C) (06/22 1300) Temp Source: Axillary (06/22 1300) BP: 104/62 (06/22 1500) Pulse Rate: 68 (06/22 1500)  Labs: Recent Labs    12/21/2017 1152 01/03/18 0516 01/03/18 0959 01/03/18 1044  HGB 11.7* 12.2* 11.0*  --   HCT 34.9* 37.0* 33.4*  --   PLT 304 365 213  --   APTT  --  34 42*  --   LABPROT  --  16.1* 22.1*  --   INR  --  1.30 1.95  --   CREATININE 1.51* 1.30* 1.95* 1.95*  CKTOTAL  --  180  --   --   TROPONINI 0.04* 0.04* 0.04*  --     Estimated Creatinine Clearance: 58.2 mL/min (A) (by C-G formula based on SCr of 1.95 mg/dL (H)).   Assessment: Patient is 52yo male admitted with tachycardia. He was initiated on Heparin drip this morning for possible PE. Second consult entered for Heparin for afib.  Heparin was running at 1600 units/hr, order was discontinued at ~13:00 and then 2nd consult entered.  Goal of Therapy:  Heparin level 0.3-0.7 units/ml Monitor platelets by anticoagulation protocol: Yes   Plan:  Start heparin infusion at 1600 units/hr Check anti-Xa level in 6 hours and daily while on heparin Continue to monitor H&H and platelets  Clovia CuffLisa Dewight Catino, PharmD, BCPS 01/03/2018 4:17 PM

## 2018-01-03 NOTE — Progress Notes (Addendum)
Patient appears critically ill- tachycardic, hypotensive- considering CRRT ICu attending and ICU RN in room providing care Management per ICU team, nothing to add from hospitalist stand point

## 2018-01-03 NOTE — Progress Notes (Addendum)
Carollee HerterShannon, RT called to report critical pH of 7.18. Dr. Peggye Pittichards notified. Awaiting the rest of the ABG results.   Update 0837: Rest of ABG results have come over and have been reported to Dr. Peggye Pittichards.

## 2018-01-03 NOTE — Progress Notes (Addendum)
[facility] and patient transported via bed .

## 2018-01-03 NOTE — Progress Notes (Signed)
   01/03/18 1945  Clinical Encounter Type  Visited With Family  Visit Type Follow-up  Spiritual Encounters  Spiritual Needs Emotional   Chaplain followed up with family in waiting room.  Patient mother and spouse spoke of some improvement in patient's condition and cautious hopefulness.  Family, friends and pastors have been to visit and offer support.  Another visitor arrived so chaplain excused herself and reminded family of ongoing availability and encouraged them to page as needed.

## 2018-01-03 NOTE — Progress Notes (Signed)
Dr Marjie SkiffSridharan made aware of lactic acid level.

## 2018-01-03 NOTE — Procedures (Signed)
Arterial Line Placement: Indication: Frequent blood draws; Invasive BP monitoring.   Consent: acquired from patient's wife  Hand washing performed prior to starting the procedure.   Procedure: An active timeout was performed and correct patient, name, & ID confirmed. Physicial exam was performed to ensure adequate perfusion.  Using sterile technique, an aterial line was inserted into the left  Femoral artery.  Catheter threaded and the needle was removed with appropriate blood return.  Arterial waveform was noted.  After the procedure, the patient's extremities were observed to be pink and warm.   Estimated Blood Loss: None .   Number of Attempts: 1.   Complications: None .  Operator: Jackson Latinoichards  Tayvien Kane,M.D

## 2018-01-03 NOTE — Progress Notes (Signed)
Notified by Pam in CT of need to speak to MD re CT order. Reports CT abdomen done earlier and 2 test in 24 hours may not be possible. Ct to phone MD to discuss.

## 2018-01-03 NOTE — Progress Notes (Signed)
   01/03/18 1033  Clinical Encounter Type  Visited With Patient and family together;Health care provider  Visit Type Initial  Spiritual Encounters  Spiritual Needs Emotional;Prayer   Chaplain responded to page from unit regarding family support.  Upon arrival, chaplain checked in with staff regarding patient condition and family needs.  Chaplain gathered with patient's spouse, mother and extended family in waiting room.  Family wanted to 'be close' when chaplain asked if they wanted to have more space in the chapel.  Chaplain offered prayer for patient, family and care team.  Spouse expressed thoughts and feelings related to patient's current condition.  She reported that they had started dating when she was [age] and said that she "couldn't imagine life without him" and so she wouldn't.  Chaplain went back into patient's room to offer prayer for him then checked back in with family before exiting.  Chaplain will continue to follow but also encouraged staff and family to page chaplain as needed.

## 2018-01-03 NOTE — Consult Note (Signed)
[facility] Cardiology  CARDIOLOGY CONSULT NOTE  Patient ID: Ryan Webb MRN: [id number] DOB/AGE: [birth year] 52 y.o.  Admit date: 12/30/2017 Referring Physician Nemiah Commander Primary Physician [facility] Primary Cardiologist Fath Reason for Consultation tachycardia  HPI: 52 year old gentleman referred for evaluation of tachycardia.  She has known coronary disease, occluded RCA with collaterals by cardiac catheterization 03/2014.  Known mild ischemic cardia myopathy, with LVEF of 45% by 2D echocardiogram 01/21/2017.  He presents on 12/14/2017 chief complaint of abdominal discomfort and bloating, with associated inability to take a deep breath.  In the emergency room, patient was noted be tachycardic with a rate of 130 bpm.  While waiting for abdominal and chest CT, the patient developed respiratory failure, requiring intubation.  She currently is intubated on a ventilator with hypotension.  Telemetry reveals atrial fibrillation with "controlled ventricular rate 60 to 70 bpm.  EKG reveals atrial fibrillation rate of 66 bpm without acute ischemic ST-T wave changes.  2D echocardiogram was performed at the bedside which revealed LVEF of 40-45%, with mild to moderate RV dilatation, without pericardial effusion.  CT and chest CT was performed which did not reveal evidence for pulmonary embolus, aortic dissection or acute abdominal process.  Admission labs notable for borderline elevated troponin of 0.04, 0.04.  Review of systems complete and found to be negative unless listed above     Past Medical History:  Diagnosis Date  . Diabetes mellitus without complication (HCC)   . Hypertension     Past Surgical History:  Procedure Laterality Date  . CHOLECYSTECTOMY  2000    Medications Prior to Admission  Medication Sig Dispense Refill Last Dose  . aspirin EC 81 MG tablet Take 81 mg by mouth daily.   01/01/2018 at Unknown time  . atorvastatin (LIPITOR) 80 MG tablet Take 1 tablet (80 mg total) by mouth daily. 90 tablet 1  01/01/2018 at Unknown time  . carvedilol (COREG) 3.125 MG tablet Take 3.125 mg by mouth 2 (two) times daily with a meal.    12/19/2017 at Unknown time  . clomiPRAMINE (ANAFRANIL) 50 MG capsule TAKE 2 CAPSULES BY MOUTH AT BEDTIME 180 capsule 3 01/01/2018 at Unknown time  . furosemide (LASIX) 20 MG tablet Take 20 mg by mouth daily.    12/24/2017 at Unknown time  . lansoprazole (PREVACID) 30 MG capsule Take 1 capsule (30 mg total) by mouth daily at 12 noon. 30 capsule 0 01/07/2018 at Unknown time  . lisinopril (PRINIVIL,ZESTRIL) 2.5 MG tablet Take 2.5 mg by mouth daily.    12/17/2017 at Unknown time  . metFORMIN (GLUCOPHAGE) 1000 MG tablet Take 1 tablet (1,000 mg total) by mouth 2 (two) times daily with a meal. 180 tablet 3 01/03/2018 at Unknown time  . metoprolol tartrate (LOPRESSOR) 25 MG tablet Take 25 mg by mouth 2 (two) times daily.  11 01/08/2018 at Unknown time  . sucralfate (CARAFATE) 1 g tablet Take 1 tablet (1 g total) by mouth 4 (four) times daily -  with meals and at bedtime. 28 tablet 0 01/08/2018 at Unknown time  . verapamil (CALAN) 40 MG tablet Take 40 mg by mouth 2 (two) times daily.  11 12/21/2017 at Unknown time  . glucose blood test strip Check sugar twice daily 100 each 3 Taking   Social History   Socioeconomic History  . Marital status: Married    Spouse name: Not on file  . Number of children: Not on file  . Years of education: Not on file  . Highest education level: Not on  file  Occupational History  . Not on file  Social Needs  . Financial resource strain: Not on file  . Food insecurity:    Worry: Not on file    Inability: Not on file  . Transportation needs:    Medical: Not on file    Non-medical: Not on file  Tobacco Use  . Smoking status: Never Smoker  . Smokeless tobacco: Never Used  Substance and Sexual Activity  . Alcohol use: No  . Drug use: No  . Sexual activity: Yes  Lifestyle  . Physical activity:    Days per week: Not on file    Minutes per session: Not  on file  . Stress: Not on file  Relationships  . Social connections:    Talks on phone: Not on file    Gets together: Not on file    Attends religious service: Not on file    Active member of club or organization: Not on file    Attends meetings of clubs or organizations: Not on file    Relationship status: Not on file  . Intimate partner violence:    Fear of current or ex partner: Not on file    Emotionally abused: Not on file    Physically abused: Not on file    Forced sexual activity: Not on file  Other Topics Concern  . Not on file  Social History Narrative  . Not on file    Family History  Problem Relation Age of Onset  . COPD Father   . Diabetes Father   . Diabetes Mother       Review of systems complete and found to be negative unless listed above      PHYSICAL EXAM  General: Well developed, well nourished, in no acute distress HEENT:  Normocephalic and atramatic Neck:  No JVD.  Lungs: Clear bilaterally to auscultation and percussion. Heart: HRRR . Normal S1 and S2 without gallops or murmurs.  Abdomen: Bowel sounds are positive, abdomen soft and non-tender  Msk:  Back normal, normal gait. Normal strength and tone for age. Extremities: No clubbing, cyanosis or edema.   Neuro: Alert and oriented X 3. Psych:  Good affect, responds appropriately  Labs:   Lab Results  Component Value Date   WBC 13.2 (H) 01/03/2018   HGB 12.2 (L) 01/03/2018   HCT 37.0 (L) 01/03/2018   MCV 101.4 (H) 01/03/2018   PLT 365 01/03/2018    Recent Labs  Lab 01/03/18 0516  NA 133*  K 4.3  CL 103  CO2 17*  BUN 28*  CREATININE 1.30*  CALCIUM 7.9*  PROT 6.7  BILITOT 2.5*  ALKPHOS 62  ALT 217*  AST 240*  GLUCOSE 221*   Lab Results  Component Value Date   CKTOTAL 180 01/03/2018   TROPONINI 0.04 (HH) 01/03/2018    Lab Results  Component Value Date   CHOL 220 (H) 09/26/2017   CHOL 225 (H) 12/12/2016   Lab Results  Component Value Date   HDL 33 (L) 09/26/2017    HDL 33 (L) 12/12/2016   Lab Results  Component Value Date   LDLCALC 130 (H) 09/26/2017   LDLCALC Comment 12/12/2016   Lab Results  Component Value Date   TRIG 101 01/03/2018   TRIG 284 (H) 09/26/2017   TRIG 482 (H) 12/12/2016   Lab Results  Component Value Date   CHOLHDL 6.7 (H) 09/26/2017   CHOLHDL 6.8 (H) 12/12/2016   Lab Results  Component Value Date   LDLDIRECT  94 12/12/2016      Radiology: Dg Chest 1 View  Result Date: 01/03/2018 CLINICAL DATA:  New intubation. EXAM: CHEST  1 VIEW COMPARISON:  01/03/2018 FINDINGS: Endotracheal tube has tip 4 cm above the carina. Nasogastric tube courses into the stomach and off the inferior portion of the film. Right IJ central venous catheter has tip over the SVC. Lungs are adequately inflated with new hazy prominence of the perihilar vasculature likely mild interstitial edema. Possible small amount right pleural fluid unchanged. No pneumothorax. Stable cardiomegaly. Remainder of the exam is unchanged. IMPRESSION: Findings suggesting new mild interstitial edema. Stable small amount right pleural fluid. Stable cardiomegaly. Tubes and lines as described. Electronically Signed   By: Elberta Fortisaniel  Boyle M.D.   On: 01/03/2018 09:28   Dg Chest 2 View  Result Date: 09/05/17 CLINICAL DATA:  Tachycardia and shortness of breath EXAM: CHEST - 2 VIEW COMPARISON:  10/16/2013 FINDINGS: The heart size and mediastinal contours are within normal limits. Both lungs are clear. The visualized skeletal structures are unremarkable. IMPRESSION: No acute abnormality noted. Electronically Signed   By: Alcide CleverMark  Lukens M.D.   On: 002/22/19 11:40   Ct Abdomen Pelvis W Contrast  Result Date: 09/05/17 CLINICAL DATA:  Abdominal distension EXAM: CT ABDOMEN AND PELVIS WITH CONTRAST TECHNIQUE: Multidetector CT imaging of the abdomen and pelvis was performed using the standard protocol following bolus administration of intravenous contrast. CONTRAST:  100mL ISOVUE-300 IOPAMIDOL  (ISOVUE-300) INJECTION 61% COMPARISON:  None. FINDINGS: Lower chest: Small right pleural effusion is noted. No focal infiltrate is identified. Hepatobiliary: Diffuse decreased attenuation in the liver is noted consistent with fatty infiltration. The gallbladder has been surgically removed. Pancreas: Unremarkable. No pancreatic ductal dilatation or surrounding inflammatory changes. Spleen: Normal in size without focal abnormality. Adrenals/Urinary Tract: Adrenal glands are within normal limits. The kidneys demonstrate a nonobstructing right lower pole renal stone. No obstructive changes are seen. No ureteral stones are noted. The bladder is well distended. Stomach/Bowel: Scattered diverticular changes noted. No evidence of diverticulitis is seen. No obstructive or inflammatory changes are noted. The appendix is within normal limits. Vascular/Lymphatic: No significant vascular findings are present. No enlarged abdominal or pelvic lymph nodes. Reproductive: Prostate is unremarkable. Other: No abdominal wall hernia or abnormality. No abdominopelvic ascites. Musculoskeletal: Degenerative changes of lumbar spine are noted. IMPRESSION: Tiny nonobstructing right lower pole renal stone. Small right-sided pleural effusion. No other focal abnormality is noted to correspond with the patient's given clinical history. Electronically Signed   By: Alcide CleverMark  Lukens M.D.   On: 002/22/19 13:30   Dg Chest Port 1 View  Result Date: 01/03/2018 CLINICAL DATA:  Sudden onset respiratory distress in inpatient. EXAM: PORTABLE CHEST 1 VIEW COMPARISON:  002/22/19 FINDINGS: Cardiac enlargement. Pulmonary vascularity is normal. Probable small right pleural effusion developing since previous study. Atelectasis in the lung bases. No focal consolidation is suggested. No pneumothorax. IMPRESSION: Cardiac enlargement. Developing small right pleural effusion. Atelectasis in the lung bases. Electronically Signed   By: Burman NievesWilliam  Stevens M.D.   On:  01/03/2018 06:42   Ct Angio Chest/abd/pel For Dissection W And/or W/wo  Result Date: 01/03/2018 CLINICAL DATA:  Pt intubated. Change in mental status since yesterday. Yesterday pt walking and talking, today pt unresponsive. Admitted to hospital with abdominal discomfort, SOB and tachycardia. Pt in acute respiratory distress. EXAM: CT ANGIOGRAPHY CHEST, ABDOMEN AND PELVIS TECHNIQUE: Multidetector CT imaging through the chest, abdomen and pelvis was performed using the standard protocol during bolus administration of intravenous contrast. Multiplanar reconstructed images and MIPs  were obtained and reviewed to evaluate the vascular anatomy. CONTRAST:  ISOVUE-370 IOPAMIDOL (ISOVUE-370) INJECTION 76% COMPARISON:  Current chest radiograph. FINDINGS: CTA CHEST FINDINGS Cardiovascular: Nurse satisfactory to opacification the segmental of the pulmonary arteries level. Study is limited by respiratory motion. Allowing for this, there is no evidence of a pulmonary embolism. Heart is mildly enlarged. No pericardial effusion. Mild three-vessel coronary artery calcifications. Great vessels normal in caliber. Mild atherosclerotic calcifications noted along the aortic arch. Mediastinum/Nodes: No neck base or axillary masses or pathologically enlarged lymph nodes. There is mediastinal adenopathy. Largest prevascular node is 16 mm short axis. Largest right paratracheal node measures 3 cm in short axis. Nodes are predominantly shotty. Prominent nodes are noted along the hila, right greater than left. The trachea is patent. Endotracheal tube is well positioned. Nasal/orogastric tube passes below the diaphragm into the proximal to mid stomach. Lungs/Pleura: Small to moderate right and minimal left pleural effusions. There is dependent lower lobe opacity consistent with atelectasis. There is subtle areas of hazy opacity in the upper lobes that are likely due to the expiratory nature of the images and air trapping. No convincing  pneumonia or pulmonary edema. No pneumothorax. Review of the MIP images confirms the above findings. CTA ABDOMEN AND PELVIS FINDINGS VASCULAR Aorta: Aorta is normal in caliber. No dissection. Minor atherosclerotic disease noted along the infrarenal abdominal aorta. Celiac: Patent without evidence of aneurysm, dissection, vasculitis or significant stenosis. SMA: Patent without evidence of aneurysm, dissection, vasculitis or significant stenosis. Renals: Small renal arteries. Are dual renal arteries on the right. No plaque or stenosis. No evidence of vasculitis or fibromuscular dysplasia. IMA: Patent without evidence of aneurysm, dissection, vasculitis or significant stenosis. Inflow: The iliac arteries are relatively small. There is mild atherosclerotic disease along the common iliac arteries and internal iliac arteries. No hemodynamically significant stenosis of either common iliac or external iliac artery. Veins: No obvious venous abnormality within the limitations of this arterial phase study. Review of the MIP images confirms the above findings. NON-VASCULAR Hepatobiliary: No focal liver abnormality is seen. Status post cholecystectomy. No biliary dilatation. Pancreas: Unremarkable. No pancreatic ductal dilatation or surrounding inflammatory changes. Spleen: Normal in size without focal abnormality. Adrenals/Urinary Tract: No adrenal masses. Mild bilateral renal cortical thinning. Nonobstructing stone in the lower pole the right kidney. No hydronephrosis. Ureters are normal course and caliber. No ureteral stones. Bladder is decompressed by a Foley catheter. Stomach/Bowel: Stomach is within normal limits. Appendix appears normal. No evidence of bowel wall thickening, distention, or inflammatory changes. Lymphatic: No pathologically enlarged lymph nodes. Reproductive: Unremarkable. Other: There is a small amount of pelvic free fluid MUSCULOSKELETAL FINDINGS No fracture or acute finding. No osteoblastic or  osteolytic lesions. Review of the MIP images confirms the above findings. IMPRESSION: CTA CHEST 1. No evidence of a pulmonary embolism. 2. Small to moderate right and minimal left pleural effusions associated with dependent lower lobe atelectasis. 3. Mild hazy increased opacity in the upper lobes. This is most likely due to air trapping. 4. No convincing pneumonia.  No pulmonary edema. CTA ABDOMEN AND PELVIS 1. No aortic dissection or aneurysm. Minor aortic atherosclerotic change. Aortic branch vessels are widely patent. 2. No acute findings within the abdomen or pelvis. 3. Small amount of pelvic free fluid, nonspecific. 4. Small nonobstructing stone in the lower pole the right kidney. Electronically Signed   By: Amie Portland M.D.   On: 01/03/2018 10:06    EKG: Atrial fibrillation, right bundle branch block, without acute ischemic ST-T wave  changes  ASSESSMENT AND PLAN:   1.  Tachycardia, transient, sinus tachycardia versus atrial flutter with right bundle branch block, compensatory for abdominal discomfort and respiratory failure, currently with controlled ventricular rate, without acute ischemic ST-T wave changes 2.  Known CAD, chronically occluded RCA by previous cardiac catheterization, no recent history of chest pain, borderline elevated troponin likely demand supply ischemia, no acute ischemic ECG changes 3.  Mild to moderate cardiomyopathy, bedside echocardiogram reveals LVEF of 40 to 45%, no significant change compared to prior echocardiogram 4.  Hypotension, chest and abdominal CT did not reveal evidence for pulmonary embolus, or aortic dissection 5.  Abdominal distention and discomfort, early elevated white count, hypotension, possible tic shock, no acute process observed on abdominal CT  Recommendations  1.  Agree with overall current therapy 2.  Defer for full dose anticoagulation at this time 3.  No definitive indication for invasive cardiac evaluation at this time.  Patient would be  at high risk for contrast-induced nephrotoxicity in the setting of anuria. 4.  No indication for mechanical hemodynamic support at this time  Signed: Marcina Millard MD,PhD, [facility] 01/03/2018, 10:17 AM

## 2018-01-03 NOTE — Progress Notes (Signed)
ANTICOAGULATION CONSULT NOTE - Follow Up Consult  Pharmacy Consult for Heparin Drip Indication: atrial fibrillation  No Known Allergies  Patient Measurements: Height: 5\' 9"  (175.3 cm) Weight: 272 lb 1.6 oz (123.4 kg) IBW/kg (Calculated) : 70.7 Heparin Dosing Weight: 98.9 kg  Vital Signs: Temp: 97.1 F (36.2 C) (06/22 2100) Temp Source: Axillary (06/22 2100) BP: 94/67 (06/22 2000) Pulse Rate: 120 (06/22 2000)  Labs: Recent Labs    01/08/2018 1152 01/03/18 0516 01/03/18 0959 01/03/18 1044 01/03/18 1518 01/03/18 1520 01/03/18 2053 01/03/18 2106  HGB 11.7* 12.2* 11.0*  --   --   --   --   --   HCT 34.9* 37.0* 33.4*  --   --   --   --   --   PLT 304 365 213  --   --   --   --   --   APTT  --  34 42*  --   --   --   --   --   LABPROT  --  16.1* 22.1*  --   --   --   --   --   INR  --  1.30 1.95  --   --   --   --   --   HEPARINUNFRC  --   --   --   --  <0.10*  --  0.28*  --   CREATININE 1.51* 1.30* 1.95* 1.95*  --  2.25*  --  2.12*  CKTOTAL  --  180  --   --   --   --   --   --   TROPONINI 0.04* 0.04* 0.04*  --  0.06*  --   --   --     Estimated Creatinine Clearance: 53.5 mL/min (A) (by C-G formula based on SCr of 2.12 mg/dL (H)).   Assessment: Patient is 52yo male admitted with tachycardia. He was initiated on Heparin drip this morning for possible PE. Second consult entered for Heparin for afib.  Heparin was running at 1600 units/hr, order was discontinued at ~13:00 and then 2nd consult entered.  Goal of Therapy:  Heparin level 0.3-0.7 units/ml Monitor platelets by anticoagulation protocol: Yes   Plan:  Start heparin infusion at 1600 units/hr Check anti-Xa level in 6 hours and daily while on heparin Continue to monitor H&H and platelets  6/22:  HL @ 21:00 = 0.28 Will order Heparin 1500 units IV X 1 bolus and increase drip rate to 1800 units/hr. Will recheck HL 6 hrs after rate change.   01/03/2018 10:54 PM

## 2018-01-03 NOTE — Procedures (Signed)
Intubation Procedure Note Lucas Mallowony W Glodowski [id number] 08/27/1965  Procedure: Intubation Indications: Respiratory insufficiency  Procedure Details Consent: Unable to obtain consent because of emergent medical necessity. Time Out: Verified patient identification, verified procedure, site/side was marked, verified correct patient position, special equipment/implants available, medications/allergies/relevent history reviewed, required imaging and test results available.  Performed  Maximum sterile technique was used including antiseptics, cap, gloves, gown, hand hygiene and mask.  A #3 miller with glidoscope used for airway visualization and intubation Medications: Fentanyl 200 mcg and versed 4mg  Normal saline bolus Levophed infusion at 10mcg  Evaluation Hemodynamic Status: Transient hypotension treated with pressors and fluid; O2 sats: currently acceptable Patient's Current Condition: stable Complications: No apparent complications Patient did tolerate procedure well. Chest X-ray ordered to verify placement.  CXR: tube position acceptable.  Procedure performed under direct supervision of Dr.Richards. Glidoscope utilized for real-time airway visualization and intubation  Magdalene S. [facility] Ltdukov ANP-BC Pulmonary and Critical Care Medicine [facility] Pager [phone] or [phone]  NB: This document was prepared using Dragon voice recognition software and may include unintentional dictation errors.    01/03/2018

## 2018-01-03 NOTE — Consult Note (Signed)
[facility] Consultation      Name: Ryan Webb MRN: [id number] DOB: 20-Jan-1966    ADMISSION DATE:  12/26/2017 CONSULTATION DATE:  01/03/2018  REFERRING MD :  Saundra Shelling, MD   CHIEF COMPLAINT:    Chest pain   HISTORY OF PRESENT ILLNESS   This is a 52 y.o. male with a known history of habitus mellitus type II, hypertension presented to the emergency room with abdominal discomfort and bloating.  This started couple of days ago.  Patient also has difficulty taking a deep breath because of the abdominal bloating.  Last bowel movement was couple of days ago.  Patient was found to be tachycardic when he presented to the emergency room.  He has been evaluated for tachycardia by Dr. Ubaldo Glassing from cardiology and has been prescribed initially metoprolol patient could not tolerate the metoprolol and he was started on verapamil.  Patient continues to be tachycardic even in the emergency room with a heart rate of 130 bpm.  He was worked up with CT abdomen which showed no obstruction or any acute pathology.  No complaints of any chest pain per se.  Hospitalist service was consulted for further care.       SIGNIFICANT EVENTS   The patient was being treated with Cardizem drip but developed worsening respiratory distress and required to be transferred to the ICU.  Upon arrival to the unit, he was hypotensive and hypoxic with mottled abdomen, cool and mottled extremities.  Hence he was intubated and central line was placed.  He was found to be severely acidemic. CT head, chest abdomen and pelvis ruled out infectious etiology or PE. Stat Echo R/O pericardial tamponade.  6/21 admission to hospital 6/22 transfer to ICU, intubated, RIJ central line, R- femoral dialysis cath, R- femoral arterial line    PAST MEDICAL HISTORY    :  Past Medical History:  Diagnosis Date  . Diabetes mellitus without complication ([place])   . Hypertension    Past Surgical History:  Procedure  Laterality Date  . CHOLECYSTECTOMY  2000   Prior to Admission medications   Medication Sig Start Date End Date Taking? Authorizing Provider  aspirin EC 81 MG tablet Take 81 mg by mouth daily.   Yes [provider]  atorvastatin (LIPITOR) 80 MG tablet Take 1 tablet (80 mg total) by mouth daily. 09/30/17  Yes Carmon Ginsberg, PA  carvedilol (COREG) 3.125 MG tablet Take 3.125 mg by mouth 2 (two) times daily with a meal.  11/19/16  Yes [provider]  clomiPRAMINE (ANAFRANIL) 50 MG capsule TAKE 2 CAPSULES BY MOUTH AT BEDTIME 12/09/17  Yes Chauvin, Herbie [place], PA  furosemide (LASIX) 20 MG tablet Take 20 mg by mouth daily.  11/19/16  Yes [provider]  lansoprazole (PREVACID) 30 MG capsule Take 1 capsule (30 mg total) by mouth daily at 12 noon. 12/19/17  Yes Carmon Ginsberg, PA  lisinopril (PRINIVIL,ZESTRIL) 2.5 MG tablet Take 2.5 mg by mouth daily.  11/19/16  Yes [provider]  metFORMIN (GLUCOPHAGE) 1000 MG tablet Take 1 tablet (1,000 mg total) by mouth 2 (two) times daily with a meal. 12/19/17  Yes Carmon Ginsberg, PA  metoprolol tartrate (LOPRESSOR) 25 MG tablet Take 25 mg by mouth 2 (two) times daily. 12/23/17  Yes [provider]  sucralfate (CARAFATE) 1 g tablet Take 1 tablet (1 g total) by mouth 4 (four) times daily -  with meals and at bedtime. 12/19/17  Yes Carmon Ginsberg, PA  verapamil (CALAN) 40  MG tablet Take 40 mg by mouth 2 (two) times daily. 12/25/17  Yes [provider]  glucose blood test strip Check sugar twice daily 01/23/17   Carmon Ginsberg, PA   No Known Allergies   FAMILY HISTORY   Family History  Problem Relation Age of Onset  . COPD Father   . Diabetes Father   . Diabetes Mother       SOCIAL HISTORY    reports that he has never smoked. He has never used smokeless tobacco. He reports that he does not drink alcohol or use drugs.  ROS On mechanical ventilation- unable to obtain.   VITAL SIGNS    Temp:  [97.4 F (36.3  C)-98.7 F (37.1 C)] 97.4 F (36.3 C) (06/22 1053) Pulse Rate:  [81-135] 81 (06/22 0800) Resp:  [14-30] 15 (06/22 1100) BP: (73-145)/(23-102) 87/56 (06/22 1100) SpO2:  [89 %-100 %] 89 % (06/22 0800) FiO2 (%):  [40 %] 40 % (06/22 0653) Weight:  [272 lb 1.6 oz (123.4 kg)] 272 lb 1.6 oz (123.4 kg) (06/21 1750) HEMODYNAMICS:  Severe compromise on multiple vasopressors  VENTILATOR SETTINGS: Vent Mode: PRVC FiO2 (%):  [40 %] 40 % Set Rate:  [15 bmp] 15 bmp Vt Set:  [500 mL] 500 mL PEEP:  [5 cmH20] 5 cmH20 INTAKE / OUTPUT:  Intake/Output Summary (Last 24 hours) at 01/03/2018 1321 Last data filed at 01/03/2018 0348 Gross per 24 hour  Intake 453 ml  Output 400 ml  Net 53 ml       PHYSICAL EXAM     GENERAL: Obese, very unstable on mechanical ventilation EYES: Pupils equal, round, minimally reactive to light. No scleral icterus. Extraocular muscles intact.  HEENT: Head atraumatic, normocephalic. ET tube in place. NECK:  Supple, no jugular venous distention. No thyroid enlargement, no tenderness.  LUNGS: Normal breath sounds bilaterally CARDIOVASCULAR: S1, S2  bradycardia, IR IR. No murmurs, rubs, or gallops.  ABDOMEN: Soft, obese, distended mottled with inaudible BS. No organomegaly or mass.  EXTREMITIES: cool lower extremities with doppler pulses- improved to warm with improvement of BP NEUROLOGIC: Unresponsive off sedatives PSYCHIATRIC: The patient is alert and oriented x 3.  SKIN: mottled abdomen and lower extremities, dusky fingers and toes     LABS   LABS:  CBC Recent Labs  Lab 12/20/2017 1152 01/03/18 0516 01/03/18 0959  WBC 8.9 13.2* 14.1*  HGB 11.7* 12.2* 11.0*  HCT 34.9* 37.0* 33.4*  PLT 304 365 213   Coag's Recent Labs  Lab 01/03/18 0516 01/03/18 0959  APTT 34 42*  INR 1.30 1.95   BMET Recent Labs  Lab 01/03/18 0516 01/03/18 0959 01/03/18 1044  NA 133* 133* 135  K 4.3 5.5* 4.6  CL 103 100* 103  CO2 17* 20* 19*  BUN 28* 31* 31*    CREATININE 1.30* 1.95* 1.95*  GLUCOSE 221* 198* 203*   Electrolytes Recent Labs  Lab 01/03/18 0516 01/03/18 0735 01/03/18 0959 01/03/18 1044  CALCIUM 7.9*  --  6.0* 6.3*  MG  --  2.1  --   --   PHOS  --  6.2*  --   --    Sepsis Markers Recent Labs  Lab 01/03/18 0516 01/03/18 0735 01/03/18 1044  LATICACIDVEN 4.2* 5.9* 7.0*  PROCALCITON 0.10  --   --    ABG Recent Labs  Lab 01/03/18 0635 01/03/18 0730 01/03/18 1017  PHART 7.27* 7.18* 7.20*  PCO2ART 31* 48 52*  PO2ART 69* 220* 123*   Liver Enzymes Recent Labs  Lab  01/03/18 0516 01/03/18 0959  AST 240* 498*  ALT 217* 404*  ALKPHOS 62 54  BILITOT 2.5* 4.0*  ALBUMIN 3.6 2.9*   Cardiac Enzymes Recent Labs  Lab 12/21/2017 1152 01/03/18 0516 01/03/18 0959  TROPONINI 0.04* 0.04* 0.04*   Glucose Recent Labs  Lab 12/24/2017 1749 01/10/2018 2052 01/03/18 0446 01/03/18 0643 01/03/18 0656 01/03/18 1029  GLUCAP 126* 200* 206* 210* 213* 165*     Recent Results (from the past 240 hour(s))  MRSA PCR Screening     Status: None   Collection Time: 01/03/18 10:56 AM  Result Value Ref Range Status   MRSA by PCR NEGATIVE NEGATIVE Final    Comment:        The GeneXpert MRSA Assay (FDA approved for NASAL specimens only), is one component of a comprehensive MRSA colonization surveillance program. It is not intended to diagnose MRSA infection nor to guide or monitor treatment for MRSA infections. Performed at [facility], [address], [place], [place] [address]      Current Facility-Administered Medications:  .  0.9 %  sodium chloride infusion, 250 mL, Intravenous, PRN, Pyreddy, Pavan, MD .  acetaminophen (TYLENOL) tablet 650 mg, 650 mg, Oral, Q6H PRN **OR** acetaminophen (TYLENOL) suppository 650 mg, 650 mg, Rectal, Q6H PRN, Pyreddy, Pavan, MD .  acetylcysteine (MUCOMYST) 20 % nebulizer / oral solution 1,200 mg, 1,200 mg, Oral, BID, Sridharan, Prasanna, MD .  albuterol (PROVENTIL) (2.5 MG/3ML)  0.083% nebulizer solution, , , ,  .  chlorhexidine gluconate (MEDLINE KIT) (PERIDEX) 0.12 % solution 15 mL, 15 mL, Mouth Rinse, BID, Tukov-Yual, Magdalene S, NP, 15 mL at 01/03/18 1057 .  EPINEPHrine (ADRENALIN) 4 mg in dextrose 5 % 250 mL (0.016 mg/mL) infusion, 0.5-20 mcg/min, Intravenous, Titrated, Lafayette Dragon, MD, Last Rate: 56.3 mL/hr at 01/03/18 1016, 15 mcg/min at 01/03/18 1016 .  famotidine (PEPCID) IVPB 20 mg premix, 20 mg, Intravenous, Q12H, Tukov-Yual, Magdalene S, NP .  fentaNYL 10 mcg/ml infusion, , , ,  .  fentaNYL 2558mg in NS 2595m(1037mml) infusion-PREMIX, 0-400 mcg/hr, Intravenous, Continuous, RicCammie Sickle MD .  heparin injection 1,000-6,000 Units, 1,000-6,000 Units, CRRT, PRN, Lateef, Munsoor, MD .  heparin injection 5,000 Units, 5,000 Units, Subcutaneous, Q8H, Mattalyn Anderegg A, MD .  insulin aspart (novoLOG) injection 0-15 Units, 0-15 Units, Subcutaneous, TID WC, Pyreddy, Pavan, MD .  insulin aspart (novoLOG) injection 0-5 Units, 0-5 Units, Subcutaneous, QHS, Pyreddy, Pavan, MD .  iopamidol (ISOVUE-370) 76 % injection 100 mL, 100 mL, Intravenous, Once PRN, Pyreddy, Pavan, MD .  ipratropium-albuterol (DUONEB) 0.5-2.5 (3) MG/3ML nebulizer solution 3 mL, 3 mL, Nebulization, Q6H, Tukov-Yual, Magdalene S, NP, 3 mL at 01/03/18 1034 .  MEDLINE mouth rinse, 15 mL, Mouth Rinse, 10 times per day, Tukov-Yual, Magdalene S, NP, 15 mL at 01/03/18 1059 .  norepinephrine (LEVOPHED) 16 mg in D5W 250m85memix infusion, 0-40 mcg/min, Intravenous, Titrated, RichLafayette Dragon, Last Rate: 37.5 mL/hr at 01/03/18 1018, 40 mcg/min at 01/03/18 1018 .  ondansetron (ZOFRAN) tablet 4 mg, 4 mg, Oral, Q6H PRN **OR** ondansetron (ZOFRAN) injection 4 mg, 4 mg, Intravenous, Q6H PRN, Pyreddy, Pavan, MD .  phenylephrine (NEOSYNEPHRINE) 10-0.9 MG/250ML-% infusion, 0-400 mcg/min, Intravenous, Titrated, RichLafayette Dragon, Last Rate: 450 mL/hr at 01/03/18 1100, 300 mcg/min at 01/03/18 1100 .   piperacillin-tazobactam (ZOSYN) IVPB 3.375 g, 3.375 g, Intravenous, Q8H, Sridharan, Prasanna, MD, Last Rate: 12.5 mL/hr at 01/03/18 0819, 3.375 g at 01/03/18 0819 .  sodium bicarbonate 150 mEq in dextrose 5 %  1,000 mL infusion, , Intravenous, Continuous, [place] Dragon, MD, Last Rate: 150 mL/hr at 01/03/18 1104 .  sodium chloride flush (NS) 0.9 % injection 3 mL, 3 mL, Intravenous, Q12H, Pyreddy, Pavan, MD, 3 mL at 01/03/18 1057 .  sodium chloride flush (NS) 0.9 % injection 3 mL, 3 mL, Intravenous, PRN, Pyreddy, Pavan, MD .  succinylcholine (ANECTINE) 20 MG/ML injection, , , ,  .  [START ON 01/04/2018] vancomycin (VANCOCIN) IVPB 1000 mg/200 mL premix, 1,000 mg, Intravenous, Q24H, Vira Blanco, RPH .  vasopressin (PITRESSIN) 40 Units in sodium chloride 0.9 % 250 mL (0.16 Units/mL) infusion, 0.03 Units/min, Intravenous, Continuous, Lafayette Dragon, MD, Last Rate: 11.3 mL/hr at 01/03/18 0800, 0.03 Units/min at 01/03/18 0800  IMAGING    Dg Chest 1 View  Result Date: 01/03/2018 CLINICAL DATA:  New intubation. EXAM: CHEST  1 VIEW COMPARISON:  01/03/2018 FINDINGS: Endotracheal tube has tip 4 cm above the carina. Nasogastric tube courses into the stomach and off the inferior portion of the film. Right IJ central venous catheter has tip over the SVC. Lungs are adequately inflated with new hazy prominence of the perihilar vasculature likely mild interstitial edema. Possible small amount right pleural fluid unchanged. No pneumothorax. Stable cardiomegaly. Remainder of the exam is unchanged. IMPRESSION: Findings suggesting new mild interstitial edema. Stable small amount right pleural fluid. Stable cardiomegaly. Tubes and lines as described. Electronically Signed   By: Marin Olp M.D.   On: 01/03/2018 09:28   Ct Head Wo Contrast  Result Date: 01/03/2018 CLINICAL DATA:  Altered mental status change since yesterday. Unresponsive. EXAM: CT HEAD WITHOUT CONTRAST TECHNIQUE: Contiguous axial images were  obtained from the base of the skull through the vertex without intravenous contrast. COMPARISON:  None. FINDINGS: Brain: No evidence of acute infarction, hemorrhage, hydrocephalus, extra-axial collection or mass lesion/mass effect. Vascular: No hyperdense vessel or unexpected calcification. Skull: Possible fracture along the posterolateral wall the right maxillary sinus as there is a small amount of air within the soft tissues just superficial to this region. Sinuses/Orbits: No acute finding. Other: None. IMPRESSION: No acute brain injury. Subtle air in the soft tissues just superficial to the posterolateral wall of the right maxillary sinus as cannot exclude a subtle fracture of the wall of the adjacent sinus wall. Recommend clinical correlation. Electronically Signed   By: Marin Olp M.D.   On: 01/03/2018 10:18   Dg Chest Port 1 View  Result Date: 01/03/2018 CLINICAL DATA:  Sudden onset respiratory distress in inpatient. EXAM: PORTABLE CHEST 1 VIEW COMPARISON:  01/09/2018 FINDINGS: Cardiac enlargement. Pulmonary vascularity is normal. Probable small right pleural effusion developing since previous study. Atelectasis in the lung bases. No focal consolidation is suggested. No pneumothorax. IMPRESSION: Cardiac enlargement. Developing small right pleural effusion. Atelectasis in the lung bases. Electronically Signed   By: Lucienne Capers M.D.   On: 01/03/2018 06:42   Ct Angio Chest/abd/pel For Dissection W And/or W/wo  Result Date: 01/03/2018 CLINICAL DATA:  Pt intubated. Change in mental status since yesterday. Yesterday pt walking and talking, today pt unresponsive. Admitted to hospital with abdominal discomfort, SOB and tachycardia. Pt in acute respiratory distress. EXAM: CT ANGIOGRAPHY CHEST, ABDOMEN AND PELVIS TECHNIQUE: Multidetector CT imaging through the chest, abdomen and pelvis was performed using the standard protocol during bolus administration of intravenous contrast. Multiplanar reconstructed  images and MIPs were obtained and reviewed to evaluate the vascular anatomy. CONTRAST:  143m ISOVUE-370 IOPAMIDOL (ISOVUE-370) INJECTION 76% COMPARISON:  Current chest radiograph. FINDINGS: CTA CHEST FINDINGS Cardiovascular:  Nurse satisfactory to opacification the segmental of the pulmonary arteries level. Study is limited by respiratory motion. Allowing for this, there is no evidence of a pulmonary embolism. Heart is mildly enlarged. No pericardial effusion. Mild three-vessel coronary artery calcifications. Great vessels normal in caliber. Mild atherosclerotic calcifications noted along the aortic arch. Mediastinum/Nodes: No neck base or axillary masses or pathologically enlarged lymph nodes. There is mediastinal adenopathy. Largest prevascular node is 16 mm short axis. Largest right paratracheal node measures 3 cm in short axis. Nodes are predominantly shotty. Prominent nodes are noted along the hila, right greater than left. The trachea is patent. Endotracheal tube is well positioned. Nasal/orogastric tube passes below the diaphragm into the proximal to mid stomach. Lungs/Pleura: Small to moderate right and minimal left pleural effusions. There is dependent lower lobe opacity consistent with atelectasis. There is subtle areas of hazy opacity in the upper lobes that are likely due to the expiratory nature of the images and air trapping. No convincing pneumonia or pulmonary edema. No pneumothorax. Review of the MIP images confirms the above findings. CTA ABDOMEN AND PELVIS FINDINGS VASCULAR Aorta: Aorta is normal in caliber. No dissection. Minor atherosclerotic disease noted along the infrarenal abdominal aorta. Celiac: Patent without evidence of aneurysm, dissection, vasculitis or significant stenosis. SMA: Patent without evidence of aneurysm, dissection, vasculitis or significant stenosis. Renals: Small renal arteries. Are dual renal arteries on the right. No plaque or stenosis. No evidence of vasculitis or  fibromuscular dysplasia. IMA: Patent without evidence of aneurysm, dissection, vasculitis or significant stenosis. Inflow: The iliac arteries are relatively small. There is mild atherosclerotic disease along the common iliac arteries and internal iliac arteries. No hemodynamically significant stenosis of either common iliac or external iliac artery. Veins: No obvious venous abnormality within the limitations of this arterial phase study. Review of the MIP images confirms the above findings. NON-VASCULAR Hepatobiliary: No focal liver abnormality is seen. Status post cholecystectomy. No biliary dilatation. Pancreas: Unremarkable. No pancreatic ductal dilatation or surrounding inflammatory changes. Spleen: Normal in size without focal abnormality. Adrenals/Urinary Tract: No adrenal masses. Mild bilateral renal cortical thinning. Nonobstructing stone in the lower pole the right kidney. No hydronephrosis. Ureters are normal course and caliber. No ureteral stones. Bladder is decompressed by a Foley catheter. Stomach/Bowel: Stomach is within normal limits. Appendix appears normal. No evidence of bowel wall thickening, distention, or inflammatory changes. Lymphatic: No pathologically enlarged lymph nodes. Reproductive: Unremarkable. Other: There is a small amount of pelvic free fluid MUSCULOSKELETAL FINDINGS No fracture or acute finding. No osteoblastic or osteolytic lesions. Review of the MIP images confirms the above findings. IMPRESSION: CTA CHEST 1. No evidence of a pulmonary embolism. 2. Small to moderate right and minimal left pleural effusions associated with dependent lower lobe atelectasis. 3. Mild hazy increased opacity in the upper lobes. This is most likely due to air trapping. 4. No convincing pneumonia.  No pulmonary edema. CTA ABDOMEN AND PELVIS 1. No aortic dissection or aneurysm. Minor aortic atherosclerotic change. Aortic branch vessels are widely patent. 2. No acute findings within the abdomen or pelvis.  3. Small amount of pelvic free fluid, nonspecific. 4. Small nonobstructing stone in the lower pole the right kidney. Electronically Signed   By: Lajean Manes M.D.   On: 01/03/2018 10:06     MAJOR EVENTS/TEST RESULTS: As above  INDWELLING DEVICES:: Peripheral Ivs, Foley R-IJ TLC, R- Femoral hemodialysis cath L- Femoral Arterial line Endotracheal tube  MICRO DATA: MRSA PCR Negative Urine pending Blood pending Resp pending  ANTIMICROBIALS:  6/22 Zosyn, Vancomyccin   ASSESSMENT/PLAN  1. Severe sepsis with profound shock. CT scans have not identified a source of infection Plan: follow up cultures, continue on Zosyn and Vancomycin, trend lactic acid, IVF, monitor CVP  2. Severe Lactic acidosis R/O Metformin toxicity Plan: patient on CRRT  3. Acute Renal Failure with severe acidosis- multifactorial Dye Nephropathy, Metformin insult, sepsis Plan: CRRT  4. Acute hypoxic Respiratory failure on ventilatory support Plan: lung protective ventilation strategy  5. Acute Hepatic failure Plan: check for viral hepatitis, check ammonia  6. Atrial fibrillation/ flutter- rate stable Plan: heparin drip   Family: updated multiple times   I have personally obtained a history, examined the patient, evaluated laboratory and independently reviewed  imaging results, formulated the assessment and plan and placed orders.  The Patient requires high complexity decision making for assessment and support, frequent evaluation and titration of therapies, application of advanced monitoring technologies and extensive interpretation of multiple databases. Critical Care Time devoted to patient care services described in this note is 180 minutes.   Overall, patient is critically ill, prognosis is guarded. Patient at high risk for cardiac arrest and death.   Cammie Sickle, M.D

## 2018-01-03 NOTE — Progress Notes (Addendum)
Aceylcysteine drip initiated.  Per pharmacy med to infuse at least 30 minutes priot to CT procedure.  Pt remains sitting on side of bed and unable to lie back in bed.  Pharmacist Onalee HuaDavid reports speaking to Dr Marjie SkiffSridharan and heparin drip to be held until CT completed.

## 2018-01-03 NOTE — Progress Notes (Addendum)
Administrative coordinator notified of need for transport to CT scan with patient.  Made aware that 2 staff will leave unit with patient when procedure able to be done. States she will come assess and have Rapid response nurse to come assist.

## 2018-01-03 NOTE — Progress Notes (Signed)
Pharmacy Antibiotic Note  Lucas Mallowony W Gotcher is a [age] y.o. male admitted on 12/31/2017 with sepsis.  Pharmacy has been consulted for vanc/zosyn dosing. Patient received vanc 1.5g IV and zosyn 3.375g IV x 1  Plan: Will continue vanc 1.25g IV q12h w/ 6 hour stack  Will continue zosyn 3.375g IV q8h  Ke 0.0769 T1/2 9 ~ 12 hrs  Goal trough 15 - 20 mcg/mL  Height: 5\' 9"  (175.3 cm) Weight: 272 lb 1.6 oz (123.4 kg) IBW/kg (Calculated) : 70.7  Temp (24hrs), Avg:98.4 F (36.9 C), Min:97.8 F (36.6 C), Max:98.8 F (37.1 C)  Recent Labs  Lab 01/03/2018 1152 01/03/18 0516  WBC 8.9 13.2*  CREATININE 1.51* 1.30*  LATICACIDVEN  --  4.2*    Estimated Creatinine Clearance: 87.3 mL/min (A) (by C-G formula based on SCr of 1.3 mg/dL (H)).    No Known Allergies   Thank you for allowing pharmacy to be a part of this patient's care.  Thomasene Rippleavid Alexee Delsanto, PharmD, BCPS Clinical Pharmacist 01/03/2018

## 2018-01-03 NOTE — Progress Notes (Signed)
Wife Marcelino DusterMichelle called to provide update on patient condition and plan for further tests.

## 2018-01-03 NOTE — Progress Notes (Signed)
Wife arrived to room and escorted to CCU waiting room.

## 2018-01-03 NOTE — Progress Notes (Signed)
PHARMACY - CRITICAL CARE PROGRESS NOTE  Pharmacy Consult for Medication Adjustment Indication: CRRT   No Known Allergies  Patient Measurements: Height: 5\' 9"  (175.3 cm) Weight: 272 lb 1.6 oz (123.4 kg) IBW/kg (Calculated) : 70.7 Adjusted Body Weight:    Vital Signs: Temp: 97.4 F (36.3 C) (06/22 1053) Temp Source: Oral (06/22 1053) BP: 87/56 (06/22 1100) Pulse Rate: 81 (06/22 0800) Intake/Output from previous day: 06/21 0701 - 06/22 0700 In: 453 [P.O.:360; I.V.:93] Out: 400 [Urine:400] Intake/Output from this shift: No intake/output data recorded. Vent settings for last 24 hours: Vent Mode: PRVC FiO2 (%):  [40 %] 40 % Set Rate:  [15 bmp] 15 bmp Vt Set:  [500 mL] 500 mL PEEP:  [5 cmH20] 5 cmH20  Labs: Recent Labs    01/07/2018 1152 01/03/18 0516 01/03/18 0735 01/03/18 0959 01/03/18 1044  WBC 8.9 13.2*  --  14.1*  --   HGB 11.7* 12.2*  --  11.0*  --   HCT 34.9* 37.0*  --  33.4*  --   PLT 304 365  --  213  --   APTT  --  34  --  42*  --   INR  --  1.30  --  1.95  --   CREATININE 1.51* 1.30*  --  1.95* 1.95*  MG  --   --  2.1  --   --   PHOS  --   --  6.2*  --   --   ALBUMIN  --  3.6  --  2.9*  --   PROT  --  6.7  --  5.5*  --   AST  --  240*  --  498*  --   ALT  --  217*  --  404*  --   ALKPHOS  --  62  --  54  --   BILITOT  --  2.5*  --  4.0*  --    Estimated Creatinine Clearance: 58.2 mL/min (A) (by C-G formula based on SCr of 1.95 mg/dL (H)).  Recent Labs    01/03/18 0643 01/03/18 0656 01/03/18 1029  GLUCAP 210* 213* 165*    Assessment: Patient is a 52yo male admitted for abdominal discomfort bloating. Patient was tachycardic on admission and is now in acute respiratory failure with severe hypotension requiring multiple vasopressors. Patient has had no urine output, SCr rising and K up to 5.5. Patient will be placed on CRRT. Pharmacy consulted for medication adjustments for CRRT.  Plan:  Will transition patient to Vancomycin 1g IV q24h while on  CRRT, patient did get Vancomycin 1500mg  IV this AM. Will check a Vancomycin level in 48 hours. Will continue with Zosyn 3.375g IV q8h EI.  Clovia CuffLisa Auria Mckinlay, PharmD, BCPS 01/03/2018 12:10 PM

## 2018-01-03 NOTE — Procedures (Signed)
Central Venous Dailysis Catheter Placement: DOUBLE LUMEN  Indication: Hemo Dialysis/CRRT   Consent:axquired from patient's wife   Hand washing performed prior to starting the procedure.   Procedure: An active timeout was performed and correct patient, name, & ID confirmed. Patient was positioned correctly for central venous access. Patient was prepped using strict sterile technique including chlorohexadine preps, sterile drape, sterile gown and sterile gloves.  The area was prepped, draped and anesthetized in the usual sterile manner. Patient comfort was obtained.    A Double lumen catheter was placed in right femoral Vein There was good blood return, catheter caps were placed on lumens, catheter flushed easily, the line was secured and a sterile dressing and BIO-PATCH applied.   Ultrasound was used to visualize vasculature and guidance of needle.   Number of Attempts: 1 Complications:none  Estimated Blood Loss: none Operator: Dorthula Perfectichards   Ryan Webb, M.D

## 2018-01-03 NOTE — Progress Notes (Signed)
Dr Marjie SkiffSridharan and CT staff made aware of inability to lie in bed for transport for procedure.  New orders given for nebulizer and Valium.

## 2018-01-03 NOTE — Progress Notes (Signed)
On call Dr Marjie SkiffSridharan made aware of continued tachycardia and shortness of breath.  Legs swollen with mottling.  Md to come assess.

## 2018-01-03 NOTE — Consult Note (Signed)
[facility] CONSULT NOTE    Date: 01/03/2018                  Patient Name:  Ryan Webb  MRN: [id number]  DOB: 1966-02-23  Age / Sex: 52 y.o., male         PCP: Carmon Ginsberg, [place]                 Service Requesting Consult: Critical Care                 Reason for Consult: Acute renal failure            History of Present Illness: Patient is a 52 y.o. male with a PMHx of diabetes mellitus type 2, hypertension, chronic systolic heart failure ejection fraction 45%, who was admitted to [facility] on 01/06/2018 for evaluation of abdominal discomfort and bloating.  Patient unable to provide any history as he is critically ill at the moment.  He was also found to be tachycardic initially.  Patient has now suffered acute respiratory failure and severe hypotension and is requiring multiple pressors.  He has had virtually no urine output this a.m.  Creatinine has now risen to 1.95 and serum potassium up to 5.5.  He also has developed metabolic acidosis with a pH of 7.18.  Thick acid is also high at 5.9.  Case discussed urgently with the patient's family members.  They have agreed to proceed with continuous renal replacement therapy given hyperkalemia, metabolic acidosis, and anuria.    Medications: Outpatient medications: Medications Prior to Admission  Medication Sig Dispense Refill Last Dose  . aspirin EC 81 MG tablet Take 81 mg by mouth daily.   01/01/2018 at Unknown time  . atorvastatin (LIPITOR) 80 MG tablet Take 1 tablet (80 mg total) by mouth daily. 90 tablet 1 01/01/2018 at Unknown time  . carvedilol (COREG) 3.125 MG tablet Take 3.125 mg by mouth 2 (two) times daily with a meal.    01/09/2018 at Unknown time  . clomiPRAMINE (ANAFRANIL) 50 MG capsule TAKE 2 CAPSULES BY MOUTH AT BEDTIME 180 capsule 3 01/01/2018 at Unknown time  . furosemide (LASIX) 20 MG tablet Take 20 mg by mouth daily.    01/05/2018 at Unknown time  . lansoprazole (PREVACID) 30 MG capsule Take 1 capsule (30  mg total) by mouth daily at 12 noon. 30 capsule 0 12/30/2017 at Unknown time  . lisinopril (PRINIVIL,ZESTRIL) 2.5 MG tablet Take 2.5 mg by mouth daily.    12/17/2017 at Unknown time  . metFORMIN (GLUCOPHAGE) 1000 MG tablet Take 1 tablet (1,000 mg total) by mouth 2 (two) times daily with a meal. 180 tablet 3 12/29/2017 at Unknown time  . metoprolol tartrate (LOPRESSOR) 25 MG tablet Take 25 mg by mouth 2 (two) times daily.  11 01/04/2018 at Unknown time  . sucralfate (CARAFATE) 1 g tablet Take 1 tablet (1 g total) by mouth 4 (four) times daily -  with meals and at bedtime. 28 tablet 0 12/30/2017 at Unknown time  . verapamil (CALAN) 40 MG tablet Take 40 mg by mouth 2 (two) times daily.  11 01/03/2018 at Unknown time  . glucose blood test strip Check sugar twice daily 100 each 3 Taking    Current medications: Current Facility-Administered Medications  Medication Dose Route Frequency Provider Last Rate Last Dose  . 0.9 %  sodium chloride infusion  250 mL Intravenous PRN Pyreddy, Reatha Harps, MD      . acetaminophen (TYLENOL) tablet 650  mg  650 mg Oral Q6H PRN Saundra Shelling, MD       Or  . acetaminophen (TYLENOL) suppository 650 mg  650 mg Rectal Q6H PRN Pyreddy, Pavan, MD      . acetylcysteine (MUCOMYST) 20 % nebulizer / oral solution 1,200 mg  1,200 mg Oral BID Jodell Cipro, Prasanna, MD      . albuterol (PROVENTIL) (2.5 MG/3ML) 0.083% nebulizer solution           . aspirin EC tablet 81 mg  81 mg Oral Daily Pyreddy, Pavan, MD      . atorvastatin (LIPITOR) tablet 80 mg  80 mg Oral Daily Pyreddy, Pavan, MD      . bisacodyl (DULCOLAX) EC tablet 5 mg  5 mg Oral Daily PRN Pyreddy, Pavan, MD      . chlorhexidine gluconate (MEDLINE KIT) (PERIDEX) 0.12 % solution 15 mL  15 mL Mouth Rinse BID Tukov-Yual, Magdalene S, NP      . clomiPRAMINE (ANAFRANIL) capsule 100 mg  100 mg Oral QHS Pyreddy, Reatha Harps, MD   100 mg at 12/14/2017 2145  . EPINEPHrine (ADRENALIN) 4 mg in dextrose 5 % 250 mL (0.016 mg/mL) infusion  0.5-20 mcg/min  Intravenous Titrated Lafayette Dragon, MD 56.3 mL/hr at 01/03/18 1016 15 mcg/min at 01/03/18 1016  . famotidine (PEPCID) IVPB 20 mg premix  20 mg Intravenous Q12H Tukov-Yual, Magdalene S, NP      . heparin ADULT infusion 100 units/mL (25000 units/27m sodium chloride 0.45%)  1,600 Units/hr Intravenous Continuous SArta Silence MD 16 mL/hr at 01/03/18 0819 1,600 Units/hr at 01/03/18 0819  . insulin aspart (novoLOG) injection 0-15 Units  0-15 Units Subcutaneous TID WC Pyreddy, Pavan, MD      . insulin aspart (novoLOG) injection 0-5 Units  0-5 Units Subcutaneous QHS Pyreddy, Pavan, MD      . iopamidol (ISOVUE-370) 76 % injection 100 mL  100 mL Intravenous Once PRN Pyreddy, Pavan, MD      . ipratropium-albuterol (DUONEB) 0.5-2.5 (3) MG/3ML nebulizer solution 3 mL  3 mL Nebulization Q6H Tukov-Yual, Magdalene S, NP   3 mL at 01/03/18 1034  . lisinopril (PRINIVIL,ZESTRIL) tablet 2.5 mg  2.5 mg Oral Daily Pyreddy, Pavan, MD      . MEDLINE mouth rinse  15 mL Mouth Rinse 10 times per day Tukov-Yual, Magdalene S, NP      . metFORMIN (GLUCOPHAGE) tablet 1,000 mg  1,000 mg Oral BID WC Pyreddy, Pavan, MD      . norepinephrine (LEVOPHED) 16 mg in D5W 2549mpremix infusion  0-40 mcg/min Intravenous Titrated RiLafayette DragonMD 37.5 mL/hr at 01/03/18 1018 40 mcg/min at 01/03/18 1018  . ondansetron (ZOFRAN) tablet 4 mg  4 mg Oral Q6H PRN Pyreddy, PaReatha HarpsMD       Or  . ondansetron (ZOFRAN) injection 4 mg  4 mg Intravenous Q6H PRN Pyreddy, Pavan, MD      . pantoprazole (PROTONIX) EC tablet 40 mg  40 mg Oral Daily Pyreddy, Pavan, MD      . phenylephrine (NEOSYNEPHRINE) 10-0.9 MG/250ML-% infusion  0-400 mcg/min Intravenous Titrated RiLafayette DragonMD      . piperacillin-tazobactam (ZOSYN) IVPB 3.375 g  3.375 g Intravenous Q8H SrArta SilenceMD 12.5 mL/hr at 01/03/18 0819 3.375 g at 01/03/18 0819  . propofol (DIPRIVAN) 1000 MG/100ML infusion  0-50 mcg/kg/min Intravenous Continuous Tukov-Yual, Magdalene  S, NP 14.8 mL/hr at 01/03/18 0820 20 mcg/kg/min at 01/03/18 0820  . senna-docusate (Senokot-S) tablet 1 tablet  1 tablet Oral QHS  PRN Saundra Shelling, MD      . sodium bicarbonate 1 mEq/mL injection           . sodium bicarbonate 150 mEq in dextrose 5 % 1,000 mL infusion   Intravenous Continuous Tukov-Yual, Magdalene S, NP 50 mL/hr at 01/03/18 0827    . sodium bicarbonate injection 100 mEq  100 mEq Intravenous Once Cammie Sickle A, MD      . sodium chloride flush (NS) 0.9 % injection 3 mL  3 mL Intravenous Q12H Pyreddy, Pavan, MD      . sodium chloride flush (NS) 0.9 % injection 3 mL  3 mL Intravenous PRN Pyreddy, Pavan, MD      . succinylcholine (ANECTINE) 20 MG/ML injection           . sucralfate (CARAFATE) tablet 1 g  1 g Oral TID WC & HS Pyreddy, Pavan, MD      . vancomycin (VANCOCIN) 1,250 mg in sodium chloride 0.9 % 250 mL IVPB  1,250 mg Intravenous Q12H Gladstone Lighter, MD      . vasopressin (PITRESSIN) 40 Units in sodium chloride 0.9 % 250 mL (0.16 Units/mL) infusion  0.03 Units/min Intravenous Continuous Lafayette Dragon, MD          Allergies: No Known Allergies    Past Medical History: Past Medical History:  Diagnosis Date  . Diabetes mellitus without complication ([place])   . Hypertension      Past Surgical History: Past Surgical History:  Procedure Laterality Date  . CHOLECYSTECTOMY  2000     Family History: Family History  Problem Relation Age of Onset  . COPD Father   . Diabetes Father   . Diabetes Mother      Social History: Social History   Socioeconomic History  . Marital status: Married    Spouse name: Not on file  . Number of children: Not on file  . Years of education: Not on file  . Highest education level: Not on file  Occupational History  . Not on file  Social Needs  . Financial resource strain: Not on file  . Food insecurity:    Worry: Not on file    Inability: Not on file  . Transportation needs:    Medical: Not on file     Non-medical: Not on file  Tobacco Use  . Smoking status: Never Smoker  . Smokeless tobacco: Never Used  Substance and Sexual Activity  . Alcohol use: No  . Drug use: No  . Sexual activity: Yes  Lifestyle  . Physical activity:    Days per week: Not on file    Minutes per session: Not on file  . Stress: Not on file  Relationships  . Social connections:    Talks on phone: Not on file    Gets together: Not on file    Attends religious service: Not on file    Active member of club or organization: Not on file    Attends meetings of clubs or organizations: Not on file    Relationship status: Not on file  . Intimate partner violence:    Fear of current or ex partner: Not on file    Emotionally abused: Not on file    Physically abused: Not on file    Forced sexual activity: Not on file  Other Topics Concern  . Not on file  Social History Narrative  . Not on file     Review of Systems: Patient unable to provide as he is  critically ill.  Vital Signs: Blood pressure 129/74, pulse (!) 104, temperature 98.7 F (37.1 C), temperature source Oral, resp. rate 20, height 5' 9"  (1.753 m), weight 123.4 kg (272 lb 1.6 oz), SpO2 95 %.  Weight trends: Filed Weights   12/21/2017 1100 12/19/2017 1750  Weight: 122.5 kg (270 lb) 123.4 kg (272 lb 1.6 oz)    Physical Exam: General:  Critically ill appearing  Head: Normocephalic, atraumatic.  Eyes: Anicteric, no spontaneous EOMs  Nose: Mucous membranes moist, not inflammed, nonerythematous.  Throat: ETT in place   Neck: Supple, trachea midline.  Lungs:  Coarse rhonchi, vent assisted  Heart: S1S2 irregular  Abdomen:  Distended, scant BS  Extremities: 1+ pretibial edema.  Neurologic: Obtunded  Skin: No visible rashes, very cool to the touch    Lab results: Basic Metabolic Panel: Recent Labs  Lab 12/13/2017 1152 01/03/18 0516 01/03/18 0735 01/03/18 0959  NA 135 133*  --  133*  K 3.9 4.3  --  5.5*  CL 101 103  --  100*  CO2 23 17*  --   20*  GLUCOSE 145* 221*  --  198*  BUN 31* 28*  --  31*  CREATININE 1.51* 1.30*  --  1.95*  CALCIUM 8.6* 7.9*  --  6.0*  MG  --   --  2.1  --   PHOS  --   --  6.2*  --     Liver Function Tests: Recent Labs  Lab 01/03/18 0516 01/03/18 0959  AST 240* 498*  ALT 217* 404*  ALKPHOS 62 54  BILITOT 2.5* 4.0*  PROT 6.7 5.5*  ALBUMIN 3.6 2.9*   No results for input(s): LIPASE, AMYLASE in the last 168 hours. No results for input(s): AMMONIA in the last 168 hours.  CBC: Recent Labs  Lab 12/31/2017 1152 01/03/18 0516 01/03/18 0959  WBC 8.9 13.2* 14.1*  HGB 11.7* 12.2* 11.0*  HCT 34.9* 37.0* 33.4*  MCV 98.6 101.4* 101.2*  PLT 304 365 213    Cardiac Enzymes: Recent Labs  Lab 12/13/2017 1152 01/03/18 0516 01/03/18 0959  CKTOTAL  --  180  --   TROPONINI 0.04* 0.04* 0.04*    BNP: Invalid input(s): POCBNP  CBG: Recent Labs  Lab 12/19/2017 2052 01/03/18 0446 01/03/18 0643 01/03/18 0656 01/03/18 1029  GLUCAP 200* 206* 210* 213* 165*    Microbiology: No results found for this or any previous visit.  Coagulation Studies: Recent Labs    01/03/18 0516 01/03/18 0959  LABPROT 16.1* 22.1*  INR 1.30 1.95    Urinalysis: No results for input(s): COLORURINE, LABSPEC, PHURINE, GLUCOSEU, HGBUR, BILIRUBINUR, KETONESUR, PROTEINUR, UROBILINOGEN, NITRITE, LEUKOCYTESUR in the last 72 hours.  Invalid input(s): APPERANCEUR    Imaging: Dg Chest 1 View  Result Date: 01/03/2018 CLINICAL DATA:  New intubation. EXAM: CHEST  1 VIEW COMPARISON:  01/03/2018 FINDINGS: Endotracheal tube has tip 4 cm above the carina. Nasogastric tube courses into the stomach and off the inferior portion of the film. Right IJ central venous catheter has tip over the SVC. Lungs are adequately inflated with new hazy prominence of the perihilar vasculature likely mild interstitial edema. Possible small amount right pleural fluid unchanged. No pneumothorax. Stable cardiomegaly. Remainder of the exam is  unchanged. IMPRESSION: Findings suggesting new mild interstitial edema. Stable small amount right pleural fluid. Stable cardiomegaly. Tubes and lines as described. Electronically Signed   By: Marin Olp M.D.   On: 01/03/2018 09:28   Dg Chest 2 View  Result Date: 12/26/2017 CLINICAL DATA:  Tachycardia and shortness of breath EXAM: CHEST - 2 VIEW COMPARISON:  10/16/2013 FINDINGS: The heart size and mediastinal contours are within normal limits. Both lungs are clear. The visualized skeletal structures are unremarkable. IMPRESSION: No acute abnormality noted. Electronically Signed   By: Inez Catalina M.D.   On: 12/28/2017 11:40   Ct Head Wo Contrast  Result Date: 01/03/2018 CLINICAL DATA:  Altered mental status change since yesterday. Unresponsive. EXAM: CT HEAD WITHOUT CONTRAST TECHNIQUE: Contiguous axial images were obtained from the base of the skull through the vertex without intravenous contrast. COMPARISON:  None. FINDINGS: Brain: No evidence of acute infarction, hemorrhage, hydrocephalus, extra-axial collection or mass lesion/mass effect. Vascular: No hyperdense vessel or unexpected calcification. Skull: Possible fracture along the posterolateral wall the right maxillary sinus as there is a small amount of air within the soft tissues just superficial to this region. Sinuses/Orbits: No acute finding. Other: None. IMPRESSION: No acute brain injury. Subtle air in the soft tissues just superficial to the posterolateral wall of the right maxillary sinus as cannot exclude a subtle fracture of the wall of the adjacent sinus wall. Recommend clinical correlation. Electronically Signed   By: Marin Olp M.D.   On: 01/03/2018 10:18   Ct Abdomen Pelvis W Contrast  Result Date: 12/17/2017 CLINICAL DATA:  Abdominal distension EXAM: CT ABDOMEN AND PELVIS WITH CONTRAST TECHNIQUE: Multidetector CT imaging of the abdomen and pelvis was performed using the standard protocol following bolus administration of  intravenous contrast. CONTRAST:  170m ISOVUE-300 IOPAMIDOL (ISOVUE-300) INJECTION 61% COMPARISON:  None. FINDINGS: Lower chest: Small right pleural effusion is noted. No focal infiltrate is identified. Hepatobiliary: Diffuse decreased attenuation in the liver is noted consistent with fatty infiltration. The gallbladder has been surgically removed. Pancreas: Unremarkable. No pancreatic ductal dilatation or surrounding inflammatory changes. Spleen: Normal in size without focal abnormality. Adrenals/Urinary Tract: Adrenal glands are within normal limits. The kidneys demonstrate a nonobstructing right lower pole renal stone. No obstructive changes are seen. No ureteral stones are noted. The bladder is well distended. Stomach/Bowel: Scattered diverticular changes noted. No evidence of diverticulitis is seen. No obstructive or inflammatory changes are noted. The appendix is within normal limits. Vascular/Lymphatic: No significant vascular findings are present. No enlarged abdominal or pelvic lymph nodes. Reproductive: Prostate is unremarkable. Other: No abdominal wall hernia or abnormality. No abdominopelvic ascites. Musculoskeletal: Degenerative changes of lumbar spine are noted. IMPRESSION: Tiny nonobstructing right lower pole renal stone. Small right-sided pleural effusion. No other focal abnormality is noted to correspond with the patient's given clinical history. Electronically Signed   By: MInez CatalinaM.D.   On: 01/10/2018 13:30   Dg Chest Port 1 View  Result Date: 01/03/2018 CLINICAL DATA:  Sudden onset respiratory distress in inpatient. EXAM: PORTABLE CHEST 1 VIEW COMPARISON:  12/27/2017 FINDINGS: Cardiac enlargement. Pulmonary vascularity is normal. Probable small right pleural effusion developing since previous study. Atelectasis in the lung bases. No focal consolidation is suggested. No pneumothorax. IMPRESSION: Cardiac enlargement. Developing small right pleural effusion. Atelectasis in the lung bases.  Electronically Signed   By: WLucienne CapersM.D.   On: 01/03/2018 06:42   Ct Angio Chest/abd/pel For Dissection W And/or W/wo  Result Date: 01/03/2018 CLINICAL DATA:  Pt intubated. Change in mental status since yesterday. Yesterday pt walking and talking, today pt unresponsive. Admitted to hospital with abdominal discomfort, SOB and tachycardia. Pt in acute respiratory distress. EXAM: CT ANGIOGRAPHY CHEST, ABDOMEN AND PELVIS TECHNIQUE: Multidetector CT imaging through the chest, abdomen and pelvis was performed using the standard  protocol during bolus administration of intravenous contrast. Multiplanar reconstructed images and MIPs were obtained and reviewed to evaluate the vascular anatomy. CONTRAST:  174m ISOVUE-370 IOPAMIDOL (ISOVUE-370) INJECTION 76% COMPARISON:  Current chest radiograph. FINDINGS: CTA CHEST FINDINGS Cardiovascular: Nurse satisfactory to opacification the segmental of the pulmonary arteries level. Study is limited by respiratory motion. Allowing for this, there is no evidence of a pulmonary embolism. Heart is mildly enlarged. No pericardial effusion. Mild three-vessel coronary artery calcifications. Great vessels normal in caliber. Mild atherosclerotic calcifications noted along the aortic arch. Mediastinum/Nodes: No neck base or axillary masses or pathologically enlarged lymph nodes. There is mediastinal adenopathy. Largest prevascular node is 16 mm short axis. Largest right paratracheal node measures 3 cm in short axis. Nodes are predominantly shotty. Prominent nodes are noted along the hila, right greater than left. The trachea is patent. Endotracheal tube is well positioned. Nasal/orogastric tube passes below the diaphragm into the proximal to mid stomach. Lungs/Pleura: Small to moderate right and minimal left pleural effusions. There is dependent lower lobe opacity consistent with atelectasis. There is subtle areas of hazy opacity in the upper lobes that are likely due to the  expiratory nature of the images and air trapping. No convincing pneumonia or pulmonary edema. No pneumothorax. Review of the MIP images confirms the above findings. CTA ABDOMEN AND PELVIS FINDINGS VASCULAR Aorta: Aorta is normal in caliber. No dissection. Minor atherosclerotic disease noted along the infrarenal abdominal aorta. Celiac: Patent without evidence of aneurysm, dissection, vasculitis or significant stenosis. SMA: Patent without evidence of aneurysm, dissection, vasculitis or significant stenosis. Renals: Small renal arteries. Are dual renal arteries on the right. No plaque or stenosis. No evidence of vasculitis or fibromuscular dysplasia. IMA: Patent without evidence of aneurysm, dissection, vasculitis or significant stenosis. Inflow: The iliac arteries are relatively small. There is mild atherosclerotic disease along the common iliac arteries and internal iliac arteries. No hemodynamically significant stenosis of either common iliac or external iliac artery. Veins: No obvious venous abnormality within the limitations of this arterial phase study. Review of the MIP images confirms the above findings. NON-VASCULAR Hepatobiliary: No focal liver abnormality is seen. Status post cholecystectomy. No biliary dilatation. Pancreas: Unremarkable. No pancreatic ductal dilatation or surrounding inflammatory changes. Spleen: Normal in size without focal abnormality. Adrenals/Urinary Tract: No adrenal masses. Mild bilateral renal cortical thinning. Nonobstructing stone in the lower pole the right kidney. No hydronephrosis. Ureters are normal course and caliber. No ureteral stones. Bladder is decompressed by a Foley catheter. Stomach/Bowel: Stomach is within normal limits. Appendix appears normal. No evidence of bowel wall thickening, distention, or inflammatory changes. Lymphatic: No pathologically enlarged lymph nodes. Reproductive: Unremarkable. Other: There is a small amount of pelvic free fluid MUSCULOSKELETAL  FINDINGS No fracture or acute finding. No osteoblastic or osteolytic lesions. Review of the MIP images confirms the above findings. IMPRESSION: CTA CHEST 1. No evidence of a pulmonary embolism. 2. Small to moderate right and minimal left pleural effusions associated with dependent lower lobe atelectasis. 3. Mild hazy increased opacity in the upper lobes. This is most likely due to air trapping. 4. No convincing pneumonia.  No pulmonary edema. CTA ABDOMEN AND PELVIS 1. No aortic dissection or aneurysm. Minor aortic atherosclerotic change. Aortic branch vessels are widely patent. 2. No acute findings within the abdomen or pelvis. 3. Small amount of pelvic free fluid, nonspecific. 4. Small nonobstructing stone in the lower pole the right kidney. Electronically Signed   By: DLajean ManesM.D.   On: 01/03/2018 10:06  Assessment & Plan: Pt is a 52 y.o. male with a PMHx of diabetes mellitus type 2, hypertension, chronic systolic heart failure ejection fraction 45%, who was admitted to [facility] on 12/15/2017 for evaluation of abdominal discomfort and bloating.   Patient had decompensation on January 03, 2018 with severe hypotension requiring multiple pressors, acute respiratory failure, and acute renal failure.  1.  Acute renal failure secondary to severe hypotension, and also exposed to contrast with CTA. 2.  Hyperkalemia. 3.  Metabolic acidosis with elevated lactic acid level. 4.  Acute respiratory failure. 5.  Elevated liver enzymes.  Plan: Patient is showing signs of multiorgan failure at the moment.  He has acute respiratory failure, acute renal failure, circulatory collapse, and liver dysfunction.  Unifying diagnosis not determined but could potentially include septic shock versus occult ingestion.  We have ordered serum toxicology screen as he is not producing any urine at the moment.  Given his acute renal failure, worsening hyperkalemia, and metabolic acidosis we recommend continuous renal replacement  therapy.  Critical care is in the process of placing temporary dialysis catheter.  Thereafter we will begin the patient on CRRT using a 2K bath given rising potassium.  CRRT should also help to correct the metabolic acidosis.  We will also check acetaminophen level as well as serum salicylates.  Further plan as patient progresses.  Patient has a very guarded prognosis at the moment.

## 2018-01-03 NOTE — Progress Notes (Signed)
   01/03/18 1340  Clinical Encounter Type  Visited With Family  Visit Type Follow-up   Chaplain encountered patient's spouse and mother while exiting unit.  Spouse reported that a procedure had just been completed and they were going to see patient.  Chaplain to follow up at a later point per spouse's request.

## 2018-01-04 ENCOUNTER — Inpatient Hospital Stay: Payer: BLUE CROSS/BLUE SHIELD

## 2018-01-04 LAB — BLOOD GAS, ARTERIAL
ACID-BASE DEFICIT: 6.2 mmol/L — AB (ref 0.0–2.0)
Acid-Base Excess: 1.2 mmol/L (ref 0.0–2.0)
Acid-base deficit: 2.7 mmol/L — ABNORMAL HIGH (ref 0.0–2.0)
Bicarbonate: 27.5 mmol/L (ref 20.0–28.0)
Bicarbonate: 26.2 mmol/L (ref 20.0–28.0)
Bicarbonate: 21 mmol/L (ref 20.0–28.0)
FIO2: 60
FIO2: 0.5
FIO2: 0.5
LHR: 22 {breaths}/min
LHR: 22 {breaths}/min
LHR: 22 {breaths}/min
MECHVT: 500 mL
O2 SAT: 99.6 %
O2 SAT: 91.8 %
O2 SAT: 98.5 %
PCO2 ART: 51 mmHg — AB (ref 32.0–48.0)
PEEP/CPAP: 5 cmH2O
PEEP: 5 cmH2O
PEEP: 5 cmH2O
PH ART: 7.2 — AB (ref 7.350–7.450)
PH ART: 7.24 — AB (ref 7.350–7.450)
PO2 ART: 186 mmHg — AB (ref 83.0–108.0)
PO2 ART: 77 mmHg — AB (ref 83.0–108.0)
PO2 ART: 131 mmHg — AB (ref 83.0–108.0)
Patient temperature: 37
Patient temperature: 37
Patient temperature: 37
VT: 500 mL
VT: 500 mL
pCO2 arterial: 67 mmHg (ref 32.0–48.0)
pCO2 arterial: 49 mmHg — ABNORMAL HIGH (ref 32.0–48.0)
pH, Arterial: 7.34 — ABNORMAL LOW (ref 7.350–7.450)

## 2018-01-04 LAB — PROTIME-INR
INR: 2.42
Prothrombin Time: 26.1 seconds — ABNORMAL HIGH (ref 11.4–15.2)

## 2018-01-04 LAB — RENAL FUNCTION PANEL
ALBUMIN: 2.6 g/dL — AB (ref 3.5–5.0)
ALBUMIN: 3.1 g/dL — AB (ref 3.5–5.0)
ALBUMIN: 2.9 g/dL — AB (ref 3.5–5.0)
ALBUMIN: 2.9 g/dL — AB (ref 3.5–5.0)
ALBUMIN: 2.7 g/dL — AB (ref 3.5–5.0)
ANION GAP: 10 (ref 5–15)
ANION GAP: 11 (ref 5–15)
ANION GAP: 7 (ref 5–15)
ANION GAP: 7 (ref 5–15)
ANION GAP: 8 (ref 5–15)
ANION GAP: 8 (ref 5–15)
ANION GAP: 9 (ref 5–15)
Albumin: 2.8 g/dL — ABNORMAL LOW (ref 3.5–5.0)
Albumin: 3 g/dL — ABNORMAL LOW (ref 3.5–5.0)
BUN: 24 mg/dL — ABNORMAL HIGH (ref 6–20)
BUN: 25 mg/dL — ABNORMAL HIGH (ref 6–20)
BUN: 25 mg/dL — ABNORMAL HIGH (ref 6–20)
BUN: 27 mg/dL — AB (ref 6–20)
BUN: 27 mg/dL — AB (ref 6–20)
BUN: 29 mg/dL — ABNORMAL HIGH (ref 6–20)
BUN: 34 mg/dL — ABNORMAL HIGH (ref 6–20)
CALCIUM: 6.4 mg/dL — AB (ref 8.9–10.3)
CALCIUM: 7.1 mg/dL — AB (ref 8.9–10.3)
CHLORIDE: 100 mmol/L — AB (ref 101–111)
CHLORIDE: 101 mmol/L (ref 101–111)
CHLORIDE: 101 mmol/L (ref 101–111)
CHLORIDE: 99 mmol/L — AB (ref 101–111)
CO2: 24 mmol/L (ref 22–32)
CO2: 24 mmol/L (ref 22–32)
CO2: 25 mmol/L (ref 22–32)
CO2: 27 mmol/L (ref 22–32)
CO2: 27 mmol/L (ref 22–32)
CO2: 27 mmol/L (ref 22–32)
CO2: 28 mmol/L (ref 22–32)
CREATININE: 1.67 mg/dL — AB (ref 0.61–1.24)
CREATININE: 1.72 mg/dL — AB (ref 0.61–1.24)
Calcium: 6.7 mg/dL — ABNORMAL LOW (ref 8.9–10.3)
Calcium: 7.1 mg/dL — ABNORMAL LOW (ref 8.9–10.3)
Calcium: 6.9 mg/dL — ABNORMAL LOW (ref 8.9–10.3)
Calcium: 7 mg/dL — ABNORMAL LOW (ref 8.9–10.3)
Calcium: 7.1 mg/dL — ABNORMAL LOW (ref 8.9–10.3)
Chloride: 103 mmol/L (ref 101–111)
Chloride: 99 mmol/L — ABNORMAL LOW (ref 101–111)
Chloride: 100 mmol/L — ABNORMAL LOW (ref 101–111)
Creatinine, Ser: 2.08 mg/dL — ABNORMAL HIGH (ref 0.61–1.24)
Creatinine, Ser: 1.8 mg/dL — ABNORMAL HIGH (ref 0.61–1.24)
Creatinine, Ser: 1.77 mg/dL — ABNORMAL HIGH (ref 0.61–1.24)
Creatinine, Ser: 1.8 mg/dL — ABNORMAL HIGH (ref 0.61–1.24)
Creatinine, Ser: 1.64 mg/dL — ABNORMAL HIGH (ref 0.61–1.24)
GFR calc Af Amer: 49 mL/min — ABNORMAL LOW (ref >60)
GFR calc Af Amer: 49 mL/min — ABNORMAL LOW (ref >60)
GFR calc Af Amer: 53 mL/min — ABNORMAL LOW (ref >60)
GFR calc Af Amer: 54 mL/min — ABNORMAL LOW (ref >60)
GFR calc non Af Amer: 35 mL/min — ABNORMAL LOW (ref >60)
GFR calc non Af Amer: 44 mL/min — ABNORMAL LOW (ref >60)
GFR, EST AFRICAN AMERICAN: 41 mL/min — AB (ref >60)
GFR, EST AFRICAN AMERICAN: 50 mL/min — AB (ref >60)
GFR, EST AFRICAN AMERICAN: 51 mL/min — AB (ref >60)
GFR, EST NON AFRICAN AMERICAN: 42 mL/min — AB (ref >60)
GFR, EST NON AFRICAN AMERICAN: 43 mL/min — AB (ref >60)
GFR, EST NON AFRICAN AMERICAN: 42 mL/min — AB (ref >60)
GFR, EST NON AFRICAN AMERICAN: 46 mL/min — AB (ref >60)
GFR, EST NON AFRICAN AMERICAN: 47 mL/min — AB (ref >60)
GLUCOSE: 153 mg/dL — AB (ref 65–99)
GLUCOSE: 189 mg/dL — AB (ref 65–99)
Glucose, Bld: 169 mg/dL — ABNORMAL HIGH (ref 65–99)
Glucose, Bld: 147 mg/dL — ABNORMAL HIGH (ref 65–99)
Glucose, Bld: 145 mg/dL — ABNORMAL HIGH (ref 65–99)
Glucose, Bld: 146 mg/dL — ABNORMAL HIGH (ref 65–99)
Glucose, Bld: 147 mg/dL — ABNORMAL HIGH (ref 65–99)
PHOSPHORUS: 5.4 mg/dL — AB (ref 2.5–4.6)
PHOSPHORUS: 5.3 mg/dL — AB (ref 2.5–4.6)
PHOSPHORUS: 4.6 mg/dL (ref 2.5–4.6)
POTASSIUM: 3.9 mmol/L (ref 3.5–5.1)
POTASSIUM: 4.6 mmol/L (ref 3.5–5.1)
POTASSIUM: 4.5 mmol/L (ref 3.5–5.1)
POTASSIUM: 4.1 mmol/L (ref 3.5–5.1)
Phosphorus: 5.9 mg/dL — ABNORMAL HIGH (ref 2.5–4.6)
Phosphorus: 5.3 mg/dL — ABNORMAL HIGH (ref 2.5–4.6)
Phosphorus: 4.7 mg/dL — ABNORMAL HIGH (ref 2.5–4.6)
Phosphorus: 4.4 mg/dL (ref 2.5–4.6)
Potassium: 4.4 mmol/L (ref 3.5–5.1)
Potassium: 4.1 mmol/L (ref 3.5–5.1)
Potassium: 4.2 mmol/L (ref 3.5–5.1)
SODIUM: 135 mmol/L (ref 135–145)
SODIUM: 135 mmol/L (ref 135–145)
Sodium: 136 mmol/L (ref 135–145)
Sodium: 135 mmol/L (ref 135–145)
Sodium: 135 mmol/L (ref 135–145)
Sodium: 134 mmol/L — ABNORMAL LOW (ref 135–145)
Sodium: 135 mmol/L (ref 135–145)

## 2018-01-04 LAB — CBC
HCT: 30.3 % — ABNORMAL LOW (ref 40.0–52.0)
HEMOGLOBIN: 10 g/dL — AB (ref 13.0–18.0)
MCH: 32.6 pg (ref 26.0–34.0)
MCHC: 33.1 g/dL (ref 32.0–36.0)
MCV: 98.6 fL (ref 80.0–100.0)
PLATELETS: 96 10*3/uL — AB (ref 150–440)
RBC: 3.07 MIL/uL — ABNORMAL LOW (ref 4.40–5.90)
RDW: 16.3 % — ABNORMAL HIGH (ref 11.5–14.5)
WBC: 21.8 10*3/uL — ABNORMAL HIGH (ref 3.8–10.6)

## 2018-01-04 LAB — FIBRINOGEN: FIBRINOGEN: 287 mg/dL (ref 210–475)

## 2018-01-04 LAB — FIBRIN DERIVATIVES D-DIMER (ARMC ONLY): Fibrin derivatives D-dimer (ARMC): >7500 ng/mL (FEU) — ABNORMAL HIGH (ref 0.00–499.00)

## 2018-01-04 LAB — MAGNESIUM
MAGNESIUM: 1.6 mg/dL — AB (ref 1.7–2.4)
MAGNESIUM: 2.2 mg/dL (ref 1.7–2.4)
MAGNESIUM: 1.9 mg/dL (ref 1.7–2.4)
MAGNESIUM: 1.9 mg/dL (ref 1.7–2.4)
Magnesium: 1.8 mg/dL (ref 1.7–2.4)
Magnesium: 2.1 mg/dL (ref 1.7–2.4)

## 2018-01-04 LAB — URINE CULTURE: CULTURE: NO GROWTH

## 2018-01-04 LAB — GLUCOSE, CAPILLARY
GLUCOSE-CAPILLARY: 147 mg/dL — AB (ref 65–99)
GLUCOSE-CAPILLARY: 127 mg/dL — AB (ref 65–99)
Glucose-Capillary: 106 mg/dL — ABNORMAL HIGH (ref 65–99)
Glucose-Capillary: 121 mg/dL — ABNORMAL HIGH (ref 65–99)

## 2018-01-04 LAB — LACTIC ACID, PLASMA
LACTIC ACID, VENOUS: 1.8 mmol/L (ref 0.5–1.9)
LACTIC ACID, VENOUS: 1.8 mmol/L (ref 0.5–1.9)
LACTIC ACID, VENOUS: 2.1 mmol/L — AB (ref 0.5–1.9)
LACTIC ACID, VENOUS: 3 mmol/L — AB (ref 0.5–1.9)
LACTIC ACID, VENOUS: 3.1 mmol/L — AB (ref 0.5–1.9)
Lactic Acid, Venous: 1.6 mmol/L (ref 0.5–1.9)

## 2018-01-04 LAB — PROCALCITONIN: PROCALCITONIN: 4.75 ng/mL

## 2018-01-04 LAB — HEPARIN LEVEL (UNFRACTIONATED)
Heparin Unfractionated: 0.32 IU/mL (ref 0.30–0.70)
Heparin Unfractionated: 0.52 IU/mL (ref 0.30–0.70)

## 2018-01-04 MED ORDER — SODIUM CHLORIDE 0.9 % IV BOLUS
1000.0000 mL | Freq: Once | INTRAVENOUS | Status: AC
  Administered 2018-01-04: 1000 mL via INTRAVENOUS

## 2018-01-04 MED ORDER — SODIUM CHLORIDE 0.9 % IV SOLN
0.5000 ug/kg/min | INTRAVENOUS | Status: DC
  Administered 2018-01-04: 0.2 ug/kg/min via INTRAVENOUS
  Administered 2018-01-05: 0.6 ug/kg/min via INTRAVENOUS
  Administered 2018-01-06: 0.8 ug/kg/min via INTRAVENOUS
  Administered 2018-01-06: 0.5 ug/kg/min via INTRAVENOUS
  Filled 2018-01-04 (×3): qty 20

## 2018-01-04 MED ORDER — SODIUM BICARBONATE 8.4 % IV SOLN
INTRAVENOUS | Status: DC
  Administered 2018-01-04 – 2018-01-05 (×2): via INTRAVENOUS
  Filled 2018-01-04 (×2): qty 150

## 2018-01-04 MED ORDER — VANCOMYCIN HCL IN DEXTROSE 1-5 GM/200ML-% IV SOLN
1000.0000 mg | INTRAVENOUS | Status: DC
  Filled 2018-01-04: qty 200

## 2018-01-04 MED ORDER — MAGNESIUM SULFATE 2 GM/50ML IV SOLN
2.0000 g | Freq: Once | INTRAVENOUS | Status: AC
  Administered 2018-01-04: 2 g via INTRAVENOUS
  Filled 2018-01-04: qty 50

## 2018-01-04 MED ORDER — SODIUM CHLORIDE 0.9 % IV SOLN
2.0000 g | Freq: Once | INTRAVENOUS | Status: AC
  Administered 2018-01-04: 2 g via INTRAVENOUS
  Filled 2018-01-04: qty 20

## 2018-01-04 MED ORDER — SODIUM BICARBONATE 8.4 % IV SOLN
INTRAVENOUS | Status: DC
  Filled 2018-01-04: qty 100

## 2018-01-04 NOTE — Progress Notes (Signed)
Pt care assumed.  NAD noted.  Pt found on multiple pressors, CRRT running.  [facility]

## 2018-01-04 NOTE — Progress Notes (Signed)
Notified Maggie,NP of magnesium result of 1.6, Lactic now at 2.1 and platelets went from 213 to 96.  Results were acknowledged and orders will be placed.

## 2018-01-04 NOTE — Progress Notes (Signed)
ANTICOAGULATION CONSULT NOTE - Follow Up Consult  Pharmacy Consult for Heparin Drip Indication: atrial fibrillation  No Known Allergies  Patient Measurements: Height: 5\' 9"  (175.3 cm) Weight: 272 lb 1.6 oz (123.4 kg) IBW/kg (Calculated) : 70.7 Heparin Dosing Weight: 98.9 kg  Vital Signs: Temp: 98.1 F (36.7 C) (06/23 0300) Temp Source: Axillary (06/23 0300) BP: 101/72 (06/23 0300) Pulse Rate: 122 (06/23 0300)  Labs: Recent Labs    01/05/2018 1152 01/03/18 0516 01/03/18 0959  01/03/18 1518  01/03/18 2053 01/03/18 2106 01/04/18 0041 01/04/18 0415  HGB 11.7* 12.2* 11.0*  --   --   --   --   --   --   --   HCT 34.9* 37.0* 33.4*  --   --   --   --   --   --   --   PLT 304 365 213  --   --   --   --   --   --   --   APTT  --  34 42*  --   --   --   --   --   --   --   LABPROT  --  16.1* 22.1*  --   --   --   --   --   --  26.1*  INR  --  1.30 1.95  --   --   --   --   --   --  2.42  HEPARINUNFRC  --   --   --   --  <0.10*  --  0.28*  --   --  0.32  CREATININE 1.51* 1.30* 1.95*   < >  --    < >  --  2.12* 2.08* 1.80*  CKTOTAL  --  180  --   --   --   --   --   --   --   --   TROPONINI 0.04* 0.04* 0.04*  --  0.06*  --   --   --   --   --    < > = values in this interval not displayed.    Estimated Creatinine Clearance: 63 mL/min (A) (by C-G formula based on SCr of 1.8 mg/dL (H)).   Assessment: Patient is 52yo male admitted with tachycardia. He was initiated on Heparin drip this morning for possible PE. Second consult entered for Heparin for afib.  Heparin was running at 1600 units/hr, order was discontinued at ~13:00 and then 2nd consult entered.  Goal of Therapy:  Heparin level 0.3-0.7 units/ml Monitor platelets by anticoagulation protocol: Yes   Plan:  Start heparin infusion at 1600 units/hr Check anti-Xa level in 6 hours and daily while on heparin Continue to monitor H&H and platelets  6/22:  HL @ 21:00 = 0.28 Will order Heparin 1500 units IV X 1 bolus and  increase drip rate to 1800 units/hr. Will recheck HL 6 hrs after rate change.   6/23:  HL @ 0400 = 0.32 Will draw confirmation level in 6 hrs on 6/23 @ 10:00.   01/04/2018 5:21 AM

## 2018-01-04 NOTE — Progress Notes (Signed)
Pt continues to rest in bed. Family at bedside.

## 2018-01-04 NOTE — Progress Notes (Signed)
ANTICOAGULATION CONSULT NOTE - Follow Up Consult  Pharmacy Consult for Heparin Drip Indication: atrial fibrillation  No Known Allergies  Patient Measurements: Height: 5\' 9"  (175.3 cm) Weight: (!) 309 lb 15.5 oz (140.6 kg) IBW/kg (Calculated) : 70.7 Heparin Dosing Weight: 98.9 kg  Vital Signs: BP: 85/47 (06/23 1400) Pulse Rate: 120 (06/23 1100)  Labs: Recent Labs    01/03/18 0516 01/03/18 0959  01/03/18 1518  01/03/18 2053  01/04/18 0415 01/04/18 0905 01/04/18 1408 01/04/18 1628  HGB 12.2* 11.0*  --   --   --   --   --  10.0*  --   --   --   HCT 37.0* 33.4*  --   --   --   --   --  30.3*  --   --   --   PLT 365 213  --   --   --   --   --  96*  --   --   --   APTT 34 42*  --   --   --   --   --   --   --   --   --   LABPROT 16.1* 22.1*  --   --   --   --   --  26.1*  --   --   --   INR 1.30 1.95  --   --   --   --   --  2.42  --   --   --   HEPARINUNFRC  --   --    < > <0.10*  --  0.28*  --  0.32  --   --  0.52  CREATININE 1.30* 1.95*   < >  --    < >  --    < > 1.80* 1.77* 1.80* 1.67*  1.72*  CKTOTAL 180  --   --   --   --   --   --   --   --   --   --   TROPONINI 0.04* 0.04*  --  0.06*  --   --   --   --   --   --   --    < > = values in this interval not displayed.    Estimated Creatinine Clearance: 73.1 mL/min (A) (by C-G formula based on SCr of 1.67 mg/dL (H)).   Assessment: Patient is 52yo male admitted with tachycardia. He was initiated on Heparin drip this morning for possible PE. Second consult entered for Heparin for afib.  Heparin was running at 1600 units/hr, order was discontinued at ~13:00 and then 2nd consult entered.  6/23 Heparin currently running at 1800 units/hr. Noted that patient now with thrombocytopenia (possibly due to sepsis, could be DIC) MD aware and watching closely.  Goal of Therapy:  Heparin level 0.3-0.7 units/ml Monitor platelets by anticoagulation protocol: Yes   Plan:   6/23@1600  Heparin level 0.52, second therapeutic level,  will continue heparin iv 1800 units/hr and recheck with AM labs.  Luan PullingGarrett Andersen Iorio, PharmD, MBA, BCGP Clinical Pharmacist  01/04/2018 6:12 PM

## 2018-01-04 NOTE — Progress Notes (Signed)
CRITICAL VALUE ALERT  Critical Value:  Calcium level of 6.4, Lactic of 3.0  Date & Time Notied:  01/04/2018 01:38  Provider Notified: Seward GraterMaggie, NP  Orders Received/Actions taken: Give a 1L fluid bolus over 2 hours for Lactic level, give 2g. Calcium replacement IV

## 2018-01-04 NOTE — Progress Notes (Signed)
[employer] - [place] [facility]   PATIENT NAME: Ryan Webb    MR#:  [id number]  DATE OF BIRTH:  [birth year]  SUBJECTIVE:  CHIEF COMPLAINT:   Chief Complaint  Patient presents with  . Chest Pain  . Irregular Heart Beat   - critically ill, on 2 pressors, intubated- sedated - on CRRT as well  REVIEW OF SYSTEMS:  Review of Systems  Unable to perform ROS: Critical illness    DRUG ALLERGIES:  No Known Allergies  VITALS:  Blood pressure 92/66, pulse (!) 121, temperature 98.1 F (36.7 C), temperature source Axillary, resp. rate (!) 21, height 5\' 9"  (1.753 m), weight (!) 140.6 kg (309 lb 15.5 oz), SpO2 94 %.  PHYSICAL EXAMINATION:  Physical Exam  GENERAL:  52 y.o.-year-old obese critically ill patient lying in the bed with no acute distress.  EYES: Pupils pinpoint and sluggish reaction to light and accommodation. No scleral icterus. Extraocular muscles intact.  HEENT: Head atraumatic, normocephalic. Cyanotic at the upper chest and lower face. Oropharynx and nasopharynx clear.  NECK:  Supple, no jugular venous distention. No thyroid enlargement, no tenderness.  LUNGS: Normal breath sounds bilaterally, no wheezing, rales,rhonchi or crepitation. No use of accessory muscles of respiration. diminished breath sounds CARDIOVASCULAR: S1, S2 normal. No murmurs, rubs, or gallops.  ABDOMEN: Soft, obese, nontender, nondistended. Hypoactive Bowel sounds present. No organomegaly or mass.  EXTREMITIES: No  cyanosis, or clubbing. 2+ pedal edema noted NEUROLOGIC: intubated and sedated.  PSYCHIATRIC: The patient is sedated.  SKIN: No obvious rash, lesion, or ulcer.    LABORATORY PANEL:   CBC Recent Labs  Lab 01/04/18 0415  WBC 21.8*  HGB 10.0*  HCT 30.3*  PLT 96*   ------------------------------------------------------------------------------------------------------------------  Chemistries  Recent Labs  Lab 01/03/18 0959  01/04/18 0905  NA 133*   < > 135  K  5.5*   < > 4.6  CL 100*   < > 99*  CO2 20*   < > 28  GLUCOSE 198*   < > 169*  BUN 31*   < > 27*  CREATININE 1.95*   < > 1.77*  CALCIUM 6.0*   < > 7.1*  MG  --    < > 2.2  AST 498*  --   --   ALT 404*  --   --   ALKPHOS 54  --   --   BILITOT 4.0*  --   --    < > = values in this interval not displayed.   ------------------------------------------------------------------------------------------------------------------  Cardiac Enzymes Recent Labs  Lab 01/03/18 1518  TROPONINI 0.06*   ------------------------------------------------------------------------------------------------------------------  RADIOLOGY:  Dg Chest 1 View  Result Date: 01/03/2018 CLINICAL DATA:  New intubation. EXAM: CHEST  1 VIEW COMPARISON:  01/03/2018 FINDINGS: Endotracheal tube has tip 4 cm above the carina. Nasogastric tube courses into the stomach and off the inferior portion of the film. Right IJ central venous catheter has tip over the SVC. Lungs are adequately inflated with new hazy prominence of the perihilar vasculature likely mild interstitial edema. Possible small amount right pleural fluid unchanged. No pneumothorax. Stable cardiomegaly. Remainder of the exam is unchanged. IMPRESSION: Findings suggesting new mild interstitial edema. Stable small amount right pleural fluid. Stable cardiomegaly. Tubes and lines as described. Electronically Signed   By: Elberta Fortis M.D.   On: 01/03/2018 09:28   Ct Head Wo Contrast  Result Date: 01/03/2018 CLINICAL DATA:  Altered mental status change since yesterday. Unresponsive. EXAM: CT HEAD WITHOUT CONTRAST TECHNIQUE:  Contiguous axial images were obtained from the base of the skull through the vertex without intravenous contrast. COMPARISON:  None. FINDINGS: Brain: No evidence of acute infarction, hemorrhage, hydrocephalus, extra-axial collection or mass lesion/mass effect. Vascular: No hyperdense vessel or unexpected calcification. Skull: Possible fracture along the  posterolateral wall the right maxillary sinus as there is a small amount of air within the soft tissues just superficial to this region. Sinuses/Orbits: No acute finding. Other: None. IMPRESSION: No acute brain injury. Subtle air in the soft tissues just superficial to the posterolateral wall of the right maxillary sinus as cannot exclude a subtle fracture of the wall of the adjacent sinus wall. Recommend clinical correlation. Electronically Signed   By: Elberta Fortisaniel  Boyle M.D.   On: 01/03/2018 10:18   Ct Abdomen Pelvis W Contrast  Result Date: 12-30-17 CLINICAL DATA:  Abdominal distension EXAM: CT ABDOMEN AND PELVIS WITH CONTRAST TECHNIQUE: Multidetector CT imaging of the abdomen and pelvis was performed using the standard protocol following bolus administration of intravenous contrast. CONTRAST:  100mL ISOVUE-300 IOPAMIDOL (ISOVUE-300) INJECTION 61% COMPARISON:  None. FINDINGS: Lower chest: Small right pleural effusion is noted. No focal infiltrate is identified. Hepatobiliary: Diffuse decreased attenuation in the liver is noted consistent with fatty infiltration. The gallbladder has been surgically removed. Pancreas: Unremarkable. No pancreatic ductal dilatation or surrounding inflammatory changes. Spleen: Normal in size without focal abnormality. Adrenals/Urinary Tract: Adrenal glands are within normal limits. The kidneys demonstrate a nonobstructing right lower pole renal stone. No obstructive changes are seen. No ureteral stones are noted. The bladder is well distended. Stomach/Bowel: Scattered diverticular changes noted. No evidence of diverticulitis is seen. No obstructive or inflammatory changes are noted. The appendix is within normal limits. Vascular/Lymphatic: No significant vascular findings are present. No enlarged abdominal or pelvic lymph nodes. Reproductive: Prostate is unremarkable. Other: No abdominal wall hernia or abnormality. No abdominopelvic ascites. Musculoskeletal: Degenerative changes of  lumbar spine are noted. IMPRESSION: Tiny nonobstructing right lower pole renal stone. Small right-sided pleural effusion. No other focal abnormality is noted to correspond with the patient's given clinical history. Electronically Signed   By: Alcide CleverMark  Lukens M.D.   On: 006-18-19 13:30   Portable Chest Xray  Result Date: 01/04/2018 CLINICAL DATA:  Respiratory failure.  Follow-up exam. EXAM: PORTABLE CHEST 1 VIEW COMPARISON:  01/03/2018 and older studies. FINDINGS: Endotracheal tube, nasogastric tube and right internal jugular central venous line are stable. Vascular congestion, mild interstitial thickening and hazy lung base opacity, right greater than left, is similar to the prior exam allowing for differences in patient positioning. Lung base opacities consistent with combination of pleural effusions and atelectasis. No new lung abnormalities.  No pneumothorax. IMPRESSION: 1. No significant change from the most recent prior exam. 2. Persistent right greater than left pleural effusions with associated atelectasis. Vascular congestion without overt pulmonary edema. 3. Stable well-positioned support apparatus. Electronically Signed   By: Amie Portlandavid  Ormond M.D.   On: 01/04/2018 08:01   Dg Chest Port 1 View  Result Date: 01/03/2018 CLINICAL DATA:  Sudden onset respiratory distress in inpatient. EXAM: PORTABLE CHEST 1 VIEW COMPARISON:  006-18-19 FINDINGS: Cardiac enlargement. Pulmonary vascularity is normal. Probable small right pleural effusion developing since previous study. Atelectasis in the lung bases. No focal consolidation is suggested. No pneumothorax. IMPRESSION: Cardiac enlargement. Developing small right pleural effusion. Atelectasis in the lung bases. Electronically Signed   By: Burman NievesWilliam  Stevens M.D.   On: 01/03/2018 06:42   Ct Angio Chest/abd/pel For Dissection W And/or W/wo  Result Date: 01/03/2018 CLINICAL  DATA:  Pt intubated. Change in mental status since yesterday. Yesterday pt walking and  talking, today pt unresponsive. Admitted to hospital with abdominal discomfort, SOB and tachycardia. Pt in acute respiratory distress. EXAM: CT ANGIOGRAPHY CHEST, ABDOMEN AND PELVIS TECHNIQUE: Multidetector CT imaging through the chest, abdomen and pelvis was performed using the standard protocol during bolus administration of intravenous contrast. Multiplanar reconstructed images and MIPs were obtained and reviewed to evaluate the vascular anatomy. CONTRAST:  ISOVUE-370 IOPAMIDOL (ISOVUE-370) INJECTION 76% COMPARISON:  Current chest radiograph. FINDINGS: CTA CHEST FINDINGS Cardiovascular: Nurse satisfactory to opacification the segmental of the pulmonary arteries level. Study is limited by respiratory motion. Allowing for this, there is no evidence of a pulmonary embolism. Heart is mildly enlarged. No pericardial effusion. Mild three-vessel coronary artery calcifications. Great vessels normal in caliber. Mild atherosclerotic calcifications noted along the aortic arch. Mediastinum/Nodes: No neck base or axillary masses or pathologically enlarged lymph nodes. There is mediastinal adenopathy. Largest prevascular node is 16 mm short axis. Largest right paratracheal node measures 3 cm in short axis. Nodes are predominantly shotty. Prominent nodes are noted along the hila, right greater than left. The trachea is patent. Endotracheal tube is well positioned. Nasal/orogastric tube passes below the diaphragm into the proximal to mid stomach. Lungs/Pleura: Small to moderate right and minimal left pleural effusions. There is dependent lower lobe opacity consistent with atelectasis. There is subtle areas of hazy opacity in the upper lobes that are likely due to the expiratory nature of the images and air trapping. No convincing pneumonia or pulmonary edema. No pneumothorax. Review of the MIP images confirms the above findings. CTA ABDOMEN AND PELVIS FINDINGS VASCULAR Aorta: Aorta is normal in caliber. No dissection.  Minor atherosclerotic disease noted along the infrarenal abdominal aorta. Celiac: Patent without evidence of aneurysm, dissection, vasculitis or significant stenosis. SMA: Patent without evidence of aneurysm, dissection, vasculitis or significant stenosis. Renals: Small renal arteries. Are dual renal arteries on the right. No plaque or stenosis. No evidence of vasculitis or fibromuscular dysplasia. IMA: Patent without evidence of aneurysm, dissection, vasculitis or significant stenosis. Inflow: The iliac arteries are relatively small. There is mild atherosclerotic disease along the common iliac arteries and internal iliac arteries. No hemodynamically significant stenosis of either common iliac or external iliac artery. Veins: No obvious venous abnormality within the limitations of this arterial phase study. Review of the MIP images confirms the above findings. NON-VASCULAR Hepatobiliary: No focal liver abnormality is seen. Status post cholecystectomy. No biliary dilatation. Pancreas: Unremarkable. No pancreatic ductal dilatation or surrounding inflammatory changes. Spleen: Normal in size without focal abnormality. Adrenals/Urinary Tract: No adrenal masses. Mild bilateral renal cortical thinning. Nonobstructing stone in the lower pole the right kidney. No hydronephrosis. Ureters are normal course and caliber. No ureteral stones. Bladder is decompressed by a Foley catheter. Stomach/Bowel: Stomach is within normal limits. Appendix appears normal. No evidence of bowel wall thickening, distention, or inflammatory changes. Lymphatic: No pathologically enlarged lymph nodes. Reproductive: Unremarkable. Other: There is a small amount of pelvic free fluid MUSCULOSKELETAL FINDINGS No fracture or acute finding. No osteoblastic or osteolytic lesions. Review of the MIP images confirms the above findings. IMPRESSION: CTA CHEST 1. No evidence of a pulmonary embolism. 2. Small to moderate right and minimal left pleural effusions  associated with dependent lower lobe atelectasis. 3. Mild hazy increased opacity in the upper lobes. This is most likely due to air trapping. 4. No convincing pneumonia.  No pulmonary edema. CTA ABDOMEN AND PELVIS 1. No aortic dissection or aneurysm.  Minor aortic atherosclerotic change. Aortic branch vessels are widely patent. 2. No acute findings within the abdomen or pelvis. 3. Small amount of pelvic free fluid, nonspecific. 4. Small nonobstructing stone in the lower pole the right kidney. Electronically Signed   By: Amie Portland M.D.   On: 01/03/2018 10:06    EKG:   Orders placed or performed during the hospital encounter of 01-26-18  . EKG 12-Lead  . EKG 12-Lead  . ED EKG within 10 minutes  . ED EKG within 10 minutes  . EKG 12-Lead  . EKG 12-Lead  . EKG 12-Lead  . EKG 12-Lead  . EKG 12-Lead  . EKG 12-Lead  . EKG 12-Lead  . EKG 12-Lead  . EKG 12-Lead  . EKG 12-Lead  . EKG 12-Lead  . EKG 12-Lead    ASSESSMENT AND PLAN:   52 year old male with obesity, diabetes, hypertension came in with tachyarrhythmia and was noted to be in septic shock  1.  Severe sepsis-septic shock after presentation. -On 2 pressors in ICU. -Intubated and sedated.  Unknown source at this time. -Multiorgan failure at this time -On broad-spectrum antibiotics. -Management per ICU team  2.  Ectopic atrial tachycardia versus atrial flutter-remains tachycardic -On heparin drip at this time. -CT Angie of the chest and pelvis negative for any PE or aortic dissection. -Appreciate cardiology consult -Echocardiogram with LV ejection fraction of 45% which is stable from prior.  3.  Acute renal failure-ATN secondary to septic shock.  Also had contrast exposure due to CT scans. -Remains on CRRT at this time. -On sodium bicarb drip. -Management per nephrology -Also has metabolic acidosis with elevated lactic acid.  Metformin is on hold  4.  Thrombocytopenia-likely could be from sepsis and acute illness.   However fibrin derivatives are elevated.  Could be DIC. -Also on heparin drip -Hemoglobin is stable for now.  5.  Acute respiratory failure-secondary to sepsis and multiorgan failure -Remains on ventilator and management per ICU team  Patient is critically ill and high risk for cardiac arrest.   All the records are reviewed and case discussed with Care Management/Social Workerr. Management plans discussed with the patient, family and they are in agreement.  CODE STATUS: Full Code  TOTAL TIME TAKING CARE OF THIS PATIENT: 37 minutes.   POSSIBLE D/C IN ? DAYS, DEPENDING ON CLINICAL CONDITION.   Enid Baas M.D on 01/04/2018 at 1:05 PM  Between 7am to 6pm - Pager - [phone]  After 6pm go to [email or web address] - Social research officer, government  [facility]  Office  [phone]  CC: Primary care physician; Anola Gurney, [place]

## 2018-01-04 NOTE — Progress Notes (Signed)
Pt is back on Nimbex for being dyssynchronous with the vent.  Pt appears comfortable.  See flowsheet for vital signs.

## 2018-01-04 NOTE — Progress Notes (Signed)
Initial Nutrition Assessment  DOCUMENTATION CODES:   Morbid obesity  INTERVENTION:  Plan is to hold off on enteral nutrition for today.  Once patient appropriate for enteral nutrition, recommend initiating Vital High Protein at 20 mL/hr and advancing by 20 mL/hr every 8 hours to goal regimen of Vital High Protein at 60 mL/hr (1440 mL goal daily volume) + Pro-Stat 60 mL BID via OGT. Provides 1840 kcal, 186 grams of protein, 1210 mL H2O daily.  When tube feeds are able to be initiated, provide B-Complex with C daily per tube and Ocuvite daily per tube to help prevent micronutrient losses on CVVHD.  NUTRITION DIAGNOSIS:   Inadequate oral intake related to inability to eat as evidenced by NPO status.  GOAL:   Provide needs based on ASPEN/SCCM guidelines  MONITOR:   Vent status, Labs, Weight trends, TF tolerance, I & O's  REASON FOR ASSESSMENT:   Ventilator    ASSESSMENT:   52 year old male with PMHx of DM type 2, HTN who initially presented with a few days history of abdominal discomfort and bloating, was found to be tachycardic in ER, later developed worsening respiratory distress requiring mechanical intubation on 6/22, also found to have severe sepsis with profound shock, severe lactic acidosis, acute renal failure on CVVHD, acute hepatic failure.   Patient is intubated and sedated. On PRVC mode with FiO2 50%. Abdomen feels a little distended today. On CVVHD with UF 50 mL/hr. Patient was 123.4 kg on admission and is now up to 140.6 kg.  Access: 16 Fr. OGT placed 6/22; terminates in stomach per chest x-ray on 6/22; currently to LIS  MAP: 75-82 mmHg  Patient is currently intubated on ventilator support Ve: 11 L/min Temp (24hrs), Avg:97.2 F (36.2 C), Min:94.2 F (34.6 C), Max:98.5 F (36.9 C)  Propofol: N/A  Medications reviewed and include: Novolog 0-15 units TID, Novolog 0-5 units QHS, thiamine 100 mg daily IV, Nimbex gtt, famotidine, fentanyl gtt, heparin gtt, Versed  gtt, Levophed gtt at 8 mcg/min, Zosyn, sodium bicarbonate 150 mEq in D5W at 100 mL/hr (120 grams dextrose, 408 kcal daily), vancomycin, vasopressin gtt. Yesterday patient was also on dopamine gtt, epinephrine gtt, and phenylephrine gtt, but they have all been stopped now.  Labs reviewed: CBG 147, Chloride 99, BUN 27 (trending down), Creatinine 1.77 (trending down), Phosphorus 5.4, Calcium 7.1 (corrects to 7.82 with albumin of 3.1).  I/O: 625 mL UOP yesterday (0.2 mL/kg/hr)  Patient does not meet criteria for malnutrition at this time.  Discussed with RN and with MD. Plan is to hold off on tube feeds for today. Patient was on 5 pressors yesterday.  NUTRITION - FOCUSED PHYSICAL EXAM:    Most Recent Value  Orbital Region  No depletion  Upper Arm Region  No depletion  Thoracic and Lumbar Region  No depletion  Buccal Region  Unable to assess  Temple Region  No depletion  Clavicle Bone Region  No depletion  Clavicle and Acromion Bone Region  No depletion  Scapular Bone Region  Unable to assess  Dorsal Hand  No depletion  Patellar Region  No depletion  Anterior Thigh Region  No depletion  Posterior Calf Region  No depletion  Edema (RD Assessment)  -- [non-pitting edema]  Hair  Reviewed  Eyes  Unable to assess  Mouth  Unable to assess  Skin  Reviewed  Nails  Reviewed     Diet Order:   Diet Order           Diet NPO  time specified  Diet effective now          EDUCATION NEEDS:   No education needs have been identified at this time  Skin:  Skin Assessment: Reviewed RN Assessment  Last BM:  PTA (01/01/2018 per chart)  Height:   Ht Readings from Last 1 Encounters:  12/26/2017 5\' 9"  (1.753 m)    Weight:   Wt Readings from Last 1 Encounters:  01/04/18 (!) 309 lb 15.5 oz (140.6 kg)    Ideal Body Weight:  72.7 kg  BMI:  Body mass index is 45.77 kg/m.  Estimated Nutritional Needs:   Kcal:  1600-1818 (22-25 kcal/kg IBW)  Protein:  182 grams (2.5 grams/kg IBW)  Fluid:   1.8-2.2 L/day (25-30 mL/kg IBW)  Helane RimaLeanne Urban Naval, MS, RD, LDN Office: [phone] Pager: [phone] After Hours/Weekend Pager: [phone]

## 2018-01-04 NOTE — Progress Notes (Signed)
PULMONARY / CRITICAL CARE MEDICINE   Name: Ryan Webb MRN: [id number] DOB: 12/14/1965    ADMISSION DATE:  11-23-17  HISTORY OF PRESENT ILLNESS:   This is a52 y.o.malewith a known history of habitus mellitus type II, hypertension presented to the emergency room with abdominal discomfort and bloating.This started couple of days ago.Patient also has difficulty taking a deep breath because of the abdominal bloating.Last bowel movement was couple of days ago.Patient was found to be tachycardic when he presented to the emergency room.He has been evaluated for tachycardia by Dr. Lady GaryFath from cardiology and has been prescribed initially metoprolol patient could not tolerate the metoprolol and he was started on verapamil.Patient continues to be tachycardic even in the emergency room with a heart rate of 130 bpm.He was worked up with CT abdomen which showed no obstruction or any acute pathology.No complaints of any chest pain per se.Hospitalist service was consulted for further care.   REVIEW OF SYSTEMS:   On vent support; unattainable  SUBJECTIVE:  Comfortable on vent  VITAL SIGNS: BP 92/66   Pulse (!) 121   Temp 98.1 F (36.7 C) (Axillary)   Resp (!) 21   Ht 5\' 9"  (1.753 m)   Wt (!) 309 lb 15.5 oz (140.6 kg)   SpO2 94%   BMI 45.77 kg/m   HEMODYNAMICS:  on Vasopressin and Levophed for BP support  VENTILATOR SETTINGS: Vent Mode: PRVC FiO2 (%):  [50 %-80 %] 50 % Set Rate:  [22 bmp] 22 bmp Vt Set:  [500 mL] 500 mL PEEP:  [5 cmH20] 5 cmH20 Plateau Pressure:  [23 cmH20-26 cmH20] 24 cmH20  INTAKE / OUTPUT: I/O last 3 completed shifts: In: 7495.8 [I.V.:6542.3; IV Piggyback:953.5] Out: 1100 [Urine:1025; Emesis/NG output:75]  PHYSICAL EXAMINATION: GENERAL:Obese, on mechanical ventilation EYES: Pupils equal, round, minimally reactive to light. No scleral icterus.   HEENT: Head atraumatic, normocephalic. ET tube in place. NECK: Supple, no jugular venous distention.  No thyroid enlargement, no tenderness.  LUNGS: Bilateral rhonchi CARDIOVASCULAR: S1, S2 tachycardia, IR IR. No murmurs, rubs, or gallops.  ABDOMEN: Soft, obese, inaudible BS. No organomegaly or mass.  EXTREMITIES: right calf > left, doppler pulses NEUROLOGIC: sedated, sedation vacation- no commands PSYCHIATRIC: on vent SKIN: warm and dry   LABS:  BMET Recent Labs  Lab 01/04/18 0041 01/04/18 0415 01/04/18 0905  NA 136 135 135  K 4.4 3.9 4.6  CL 101 103 99*  CO2 24 25 28   BUN 34* 29* 27*  CREATININE 2.08* 1.80* 1.77*  GLUCOSE 189* 153* 169*    Electrolytes Recent Labs  Lab 01/04/18 0041 01/04/18 0415 01/04/18 0905  CALCIUM 6.4* 6.7* 7.1*  MG 1.8 1.6* 2.2  PHOS 5.9* 5.3* 5.4*    CBC Recent Labs  Lab 01/03/18 0516 01/03/18 0959 01/04/18 0415  WBC 13.2* 14.1* 21.8*  HGB 12.2* 11.0* 10.0*  HCT 37.0* 33.4* 30.3*  PLT 365 213 96*    Coag's Recent Labs  Lab 01/03/18 0516 01/03/18 0959 01/04/18 0415  APTT 34 42*  --   INR 1.30 1.95 2.42    Sepsis Markers Recent Labs  Lab 01/03/18 0516  01/04/18 0041 01/04/18 0415 01/04/18 0429 01/04/18 0905  LATICACIDVEN 4.2*   < > 3.0*  --  2.1* 3.1*  PROCALCITON 0.10  --   --  4.75  --   --    < > = values in this interval not displayed.    ABG Recent Labs  Lab 01/03/18 2101 01/04/18 0101 01/04/18 0850  PHART 7.27* 7.34*  7.20*  PCO2ART 48 51* 67*  PO2ART 169* 186* 77*    Liver Enzymes Recent Labs  Lab 01/03/18 0516 01/03/18 0959  01/04/18 0041 01/04/18 0415 01/04/18 0905  AST 240* 498*  --   --   --   --   ALT 217* 404*  --   --   --   --   ALKPHOS 62 54  --   --   --   --   BILITOT 2.5* 4.0*  --   --   --   --   ALBUMIN 3.6 2.9*   < > 2.8* 2.6* 3.1*   < > = values in this interval not displayed.    Cardiac Enzymes Recent Labs  Lab 01/03/18 0516 01/03/18 0959 01/03/18 1518  TROPONINI 0.04* 0.04* 0.06*    Glucose Recent Labs  Lab 01/03/18 0656 01/03/18 1029 01/03/18 1620  01/03/18 2148 01/04/18 0716 01/04/18 1112  GLUCAP 213* 165* 195* 179* 147* 106*    Imaging Portable Chest Xray  Result Date: 01/04/2018 CLINICAL DATA:  Respiratory failure.  Follow-up exam. EXAM: PORTABLE CHEST 1 VIEW COMPARISON:  01/03/2018 and older studies. FINDINGS: Endotracheal tube, nasogastric tube and right internal jugular central venous line are stable. Vascular congestion, mild interstitial thickening and hazy lung base opacity, right greater than left, is similar to the prior exam allowing for differences in patient positioning. Lung base opacities consistent with combination of pleural effusions and atelectasis. No new lung abnormalities.  No pneumothorax. IMPRESSION: 1. No significant change from the most recent prior exam. 2. Persistent right greater than left pleural effusions with associated atelectasis. Vascular congestion without overt pulmonary edema. 3. Stable well-positioned support apparatus. Electronically Signed   By: Amie Portland M.D.   On: 01/04/2018 08:01     STUDIES:  6/22 ECHO  CULTURES: MRSA PCR- Neg  ANTIBIOTICS: 6/22Zosyn; Vancomycin  SIGNIFICANT EVENTS: 6/21 admission to hospital 6/22 transfer to ICU,    6./  LINES/TUBES: 6/22  Foley 6/22 R-IJ TLC, R- Femoral hemodialysis cath 6/22 L- Femoral Arterial line 6/22 Endotracheal tube   DISCUSSION: This 52 year old gentleman admitted with severe sepsis cultures negative, scans negative found to have acute renal failure with metformin toxicity and dye nephropathy.    ASSESSMENT / PLAN: 1. Severe sepsis with profound shock. CT scans have not identified a source of infection, all cultures remain negative Plan: will Continue Zosyn & Vancomycin renally dosed.  2. Severe Lactic acidosis R/O Metformin toxicity Lactic acidosis has continually improved since starting CRRT Plan: Continue to trend lactic acid/ sepsis markers  3. Acute Renal Failure with severe acidosis- multifactorial Dye  Nephropathy, Metformin insult, sepsis Plan: CRRT  4. Acute hypoxic Respiratory failure on ventilatory support Plan: lung protective ventilation strategy  5. Acute Hepatic failure Plan: check for viral hepatitis, check ammonia  6. Atrial fibrillation/ flutter- rate stable Plan: heparin drip  7. DIC Plan: monitor Platets levels.    FAMILY  - Updates:  Thank you for allowing me the privileges to care for this patient.  I have dedicated a total of 45 minutes in critical care time minus all appropriate exclusions.   Jackson Latino, MD Pulmonary and Critical Care Medicine [facility] Pager: [phone]  01/04/2018, 12:07 PM

## 2018-01-04 NOTE — Progress Notes (Signed)
[facility]  ROUNDING NOTE   Subjective:  Patient continues to be quite ill with multiorgan failure. Initial ABG earlier this a.m. showed a pH of 7.34 with PCO2 of 51. However earlier this a.m. pH was down to 7.20 with a PCO2 of 67.   Objective:  Vital signs in last 24 hours:  Temp:  [94.2 F (34.6 C)-98.5 F (36.9 C)] 98.1 F (36.7 C) (06/23 0300) Pulse Rate:  [68-123] 121 (06/23 0700) Resp:  [0-24] 21 (06/23 0800) BP: (73-111)/(23-72) 92/66 (06/23 0800) SpO2:  [81 %-100 %] 99 % (06/23 0700) FiO2 (%):  [50 %-80 %] 50 % (06/23 0457) Weight:  [140.6 kg (309 lb 15.5 oz)] 140.6 kg (309 lb 15.5 oz) (06/23 0500)  Weight change: 18.1 kg (39 lb 15.5 oz) Filed Weights   12/30/2017 1100 01/08/2018 1750 01/04/18 0500  Weight: 122.5 kg (270 lb) 123.4 kg (272 lb 1.6 oz) (!) 140.6 kg (309 lb 15.5 oz)    Intake/Output: I/O last 3 completed shifts: In: 7495.8 [I.V.:6542.3; IV Piggyback:953.5] Out: 1100 [Urine:1025; Emesis/NG output:75]   Intake/Output this shift:  Total I/O In: -  Out: 250 [Urine:250]  Physical Exam: General:  Otically ill-appearing  Head: Facial swelling noted, ETT in place  Eyes: Scleral edema  Neck: Supple, trachea midline  Lungs:  Coarse rhonchi bilateral, vent assisted  Heart: S1S2 no rubs  Abdomen:  Soft, nontender, bowel sounds present  Extremities: Anasarca  Neurologic: On sedation, not following commands  Skin: Mottled appearance  Access: Right temporary femoral dialysis catheter    Basic Metabolic Panel: Recent Labs  Lab 01/03/18 0735  01/03/18 1044 01/03/18 1518 01/03/18 1520 01/03/18 2053 01/03/18 2106 01/04/18 0041 01/04/18 0415  NA  --    < > 135  --  133*  --  135 136 135  K  --    < > 4.6  --  4.4  --  3.7 4.4 3.9  CL  --    < > 103  --  100*  --  101 101 103  CO2  --    < > 19*  --  19*  --  24 24 25   GLUCOSE  --    < > 203*  --  246*  --  216* 189* 153*  BUN  --    < > 31*  --  34*  --  32* 34* 29*  CREATININE  --    <  > 1.95*  --  2.25*  --  2.12* 2.08* 1.80*  CALCIUM  --    < > 6.3*  --  6.4*  --  6.2* 6.4* 6.7*  MG 2.1  --   --  1.9  --  1.9  --  1.8 1.6*  PHOS 6.2*  --   --   --  7.4*  --  5.1* 5.9* 5.3*   < > = values in this interval not displayed.    Liver Function Tests: Recent Labs  Lab 01/03/18 0516 01/03/18 0959 01/03/18 1520 01/03/18 2106 01/04/18 0041 01/04/18 0415  AST 240* 498*  --   --   --   --   ALT 217* 404*  --   --   --   --   ALKPHOS 62 54  --   --   --   --   BILITOT 2.5* 4.0*  --   --   --   --   PROT 6.7 5.5*  --   --   --   --  ALBUMIN 3.6 2.9* 3.1* 2.7* 2.8* 2.6*   Recent Labs  Lab 01/03/18 1044  LIPASE 52*  AMYLASE 45   Recent Labs  Lab 01/03/18 1518  AMMONIA 64*    CBC: Recent Labs  Lab 12/24/2017 1152 01/03/18 0516 01/03/18 0959 01/04/18 0415  WBC 8.9 13.2* 14.1* 21.8*  HGB 11.7* 12.2* 11.0* 10.0*  HCT 34.9* 37.0* 33.4* 30.3*  MCV 98.6 101.4* 101.2* 98.6  PLT 304 365 213 96*    Cardiac Enzymes: Recent Labs  Lab 01/09/2018 1152 01/03/18 0516 01/03/18 0959 01/03/18 1518  CKTOTAL  --  180  --   --   TROPONINI 0.04* 0.04* 0.04* 0.06*    BNP: Invalid input(s): POCBNP  CBG: Recent Labs  Lab 01/03/18 0656 01/03/18 1029 01/03/18 1620 01/03/18 2148 01/04/18 0716  GLUCAP 213* 165* 195* 179* 147*    Microbiology: Results for orders placed or performed during the hospital encounter of 01/05/2018  Culture, blood (routine x 2)     Status: None (Preliminary result)   Collection Time: 01/03/18 10:42 AM  Result Value Ref Range Status   Specimen Description BLOOD LT [facility]  Final   Special Requests   Final    BOTTLES DRAWN AEROBIC AND ANAEROBIC Blood Culture adequate volume   Culture   Final    NO GROWTH < 24 HOURS Performed at [facility], [address] [place], [place] [address]    Report Status PENDING  Incomplete  MRSA PCR Screening     Status: None   Collection Time: 01/03/18 10:56 AM  Result Value Ref Range Status   MRSA  by PCR NEGATIVE NEGATIVE Final    Comment:        The GeneXpert MRSA Assay (FDA approved for NASAL specimens only), is one component of a comprehensive MRSA colonization surveillance program. It is not intended to diagnose MRSA infection nor to guide or monitor treatment for MRSA infections. Performed at [facility], [address] [place], [place] [address]   CULTURE, BLOOD (ROUTINE X 2) w Reflex to ID Panel     Status: None (Preliminary result)   Collection Time: 01/03/18  1:25 PM  Result Value Ref Range Status   Specimen Description BLOOD LINE  Final   Special Requests   Final    BOTTLES DRAWN AEROBIC AND ANAEROBIC Blood Culture adequate volume   Culture   Final    NO GROWTH < 24 HOURS Performed at [facility], [address], [place], [place] [address]    Report Status PENDING  Incomplete  Culture, expectorated sputum-assessment     Status: None   Collection Time: 01/03/18  5:53 PM  Result Value Ref Range Status   Specimen Description TRACHEAL ASPIRATE  Final   Special Requests NONE  Final   Sputum evaluation   Final    THIS SPECIMEN IS ACCEPTABLE FOR SPUTUM CULTURE Performed at [facility], [address], [place], [place] [address]    Report Status 01/03/2018 FINAL  Final    Coagulation Studies: Recent Labs    01/03/18 0516 01/03/18 0959 01/04/18 0415  LABPROT 16.1* 22.1* 26.1*  INR 1.30 1.95 2.42    Urinalysis: Recent Labs    01/03/18 1649  COLORURINE AMBER*  LABSPEC 1.044*  PHURINE 5.0  GLUCOSEU 50*  HGBUR MODERATE*  BILIRUBINUR NEGATIVE  KETONESUR NEGATIVE  PROTEINUR 100*  NITRITE NEGATIVE  LEUKOCYTESUR NEGATIVE      Imaging: Dg Chest 1 View  Result Date: 01/03/2018 CLINICAL DATA:  New intubation. EXAM: CHEST  1 VIEW COMPARISON:  01/03/2018 FINDINGS: Endotracheal tube has tip 4 cm above the carina. Nasogastric tube courses into the stomach and off the inferior portion of the film. Right IJ central venous  catheter has tip over the SVC. Lungs are adequately inflated with new hazy prominence of the perihilar vasculature likely mild interstitial edema. Possible small amount right pleural fluid unchanged. No pneumothorax. Stable cardiomegaly. Remainder of the exam is unchanged. IMPRESSION: Findings suggesting new mild interstitial edema. Stable small amount right pleural fluid. Stable cardiomegaly. Tubes and lines as described. Electronically Signed   By: Marin Olp M.D.   On: 01/03/2018 09:28   Dg Chest 2 View  Result Date: 12/28/2017 CLINICAL DATA:  Tachycardia and shortness of breath EXAM: CHEST - 2 VIEW COMPARISON:  10/16/2013 FINDINGS: The heart size and mediastinal contours are within normal limits. Both lungs are clear. The visualized skeletal structures are unremarkable. IMPRESSION: No acute abnormality noted. Electronically Signed   By: Inez Catalina M.D.   On: 12/29/2017 11:40   Ct Head Wo Contrast  Result Date: 01/03/2018 CLINICAL DATA:  Altered mental status change since yesterday. Unresponsive. EXAM: CT HEAD WITHOUT CONTRAST TECHNIQUE: Contiguous axial images were obtained from the base of the skull through the vertex without intravenous contrast. COMPARISON:  None. FINDINGS: Brain: No evidence of acute infarction, hemorrhage, hydrocephalus, extra-axial collection or mass lesion/mass effect. Vascular: No hyperdense vessel or unexpected calcification. Skull: Possible fracture along the posterolateral wall the right maxillary sinus as there is a small amount of air within the soft tissues just superficial to this region. Sinuses/Orbits: No acute finding. Other: None. IMPRESSION: No acute brain injury. Subtle air in the soft tissues just superficial to the posterolateral wall of the right maxillary sinus as cannot exclude a subtle fracture of the wall of the adjacent sinus wall. Recommend clinical correlation. Electronically Signed   By: Marin Olp M.D.   On: 01/03/2018 10:18   Ct Abdomen Pelvis  W Contrast  Result Date: 01/09/2018 CLINICAL DATA:  Abdominal distension EXAM: CT ABDOMEN AND PELVIS WITH CONTRAST TECHNIQUE: Multidetector CT imaging of the abdomen and pelvis was performed using the standard protocol following bolus administration of intravenous contrast. CONTRAST:  175m ISOVUE-300 IOPAMIDOL (ISOVUE-300) INJECTION 61% COMPARISON:  None. FINDINGS: Lower chest: Small right pleural effusion is noted. No focal infiltrate is identified. Hepatobiliary: Diffuse decreased attenuation in the liver is noted consistent with fatty infiltration. The gallbladder has been surgically removed. Pancreas: Unremarkable. No pancreatic ductal dilatation or surrounding inflammatory changes. Spleen: Normal in size without focal abnormality. Adrenals/Urinary Tract: Adrenal glands are within normal limits. The kidneys demonstrate a nonobstructing right lower pole renal stone. No obstructive changes are seen. No ureteral stones are noted. The bladder is well distended. Stomach/Bowel: Scattered diverticular changes noted. No evidence of diverticulitis is seen. No obstructive or inflammatory changes are noted. The appendix is within normal limits. Vascular/Lymphatic: No significant vascular findings are present. No enlarged abdominal or pelvic lymph nodes. Reproductive: Prostate is unremarkable. Other: No abdominal wall hernia or abnormality. No abdominopelvic ascites. Musculoskeletal: Degenerative changes of lumbar spine are noted. IMPRESSION: Tiny nonobstructing right lower pole renal stone. Small right-sided pleural effusion. No other focal abnormality is noted to correspond with the patient's given clinical history. Electronically Signed   By: MInez CatalinaM.D.   On: 12/23/2017 13:30   Portable Chest Xray  Result Date: 01/04/2018 CLINICAL DATA:  Respiratory failure.  Follow-up exam. EXAM: PORTABLE CHEST 1 VIEW COMPARISON:  01/03/2018 and older studies. FINDINGS: Endotracheal tube, nasogastric tube  and right  internal jugular central venous line are stable. Vascular congestion, mild interstitial thickening and hazy lung base opacity, right greater than left, is similar to the prior exam allowing for differences in patient positioning. Lung base opacities consistent with combination of pleural effusions and atelectasis. No new lung abnormalities.  No pneumothorax. IMPRESSION: 1. No significant change from the most recent prior exam. 2. Persistent right greater than left pleural effusions with associated atelectasis. Vascular congestion without overt pulmonary edema. 3. Stable well-positioned support apparatus. Electronically Signed   By: Lajean Manes M.D.   On: 01/04/2018 08:01   Dg Chest Port 1 View  Result Date: 01/03/2018 CLINICAL DATA:  Sudden onset respiratory distress in inpatient. EXAM: PORTABLE CHEST 1 VIEW COMPARISON:  12/23/2017 FINDINGS: Cardiac enlargement. Pulmonary vascularity is normal. Probable small right pleural effusion developing since previous study. Atelectasis in the lung bases. No focal consolidation is suggested. No pneumothorax. IMPRESSION: Cardiac enlargement. Developing small right pleural effusion. Atelectasis in the lung bases. Electronically Signed   By: Lucienne Capers M.D.   On: 01/03/2018 06:42   Ct Angio Chest/abd/pel For Dissection W And/or W/wo  Result Date: 01/03/2018 CLINICAL DATA:  Pt intubated. Change in mental status since yesterday. Yesterday pt walking and talking, today pt unresponsive. Admitted to hospital with abdominal discomfort, SOB and tachycardia. Pt in acute respiratory distress. EXAM: CT ANGIOGRAPHY CHEST, ABDOMEN AND PELVIS TECHNIQUE: Multidetector CT imaging through the chest, abdomen and pelvis was performed using the standard protocol during bolus administration of intravenous contrast. Multiplanar reconstructed images and MIPs were obtained and reviewed to evaluate the vascular anatomy. CONTRAST:  180m ISOVUE-370 IOPAMIDOL (ISOVUE-370) INJECTION 76%  COMPARISON:  Current chest radiograph. FINDINGS: CTA CHEST FINDINGS Cardiovascular: Nurse satisfactory to opacification the segmental of the pulmonary arteries level. Study is limited by respiratory motion. Allowing for this, there is no evidence of a pulmonary embolism. Heart is mildly enlarged. No pericardial effusion. Mild three-vessel coronary artery calcifications. Great vessels normal in caliber. Mild atherosclerotic calcifications noted along the aortic arch. Mediastinum/Nodes: No neck base or axillary masses or pathologically enlarged lymph nodes. There is mediastinal adenopathy. Largest prevascular node is 16 mm short axis. Largest right paratracheal node measures 3 cm in short axis. Nodes are predominantly shotty. Prominent nodes are noted along the hila, right greater than left. The trachea is patent. Endotracheal tube is well positioned. Nasal/orogastric tube passes below the diaphragm into the proximal to mid stomach. Lungs/Pleura: Small to moderate right and minimal left pleural effusions. There is dependent lower lobe opacity consistent with atelectasis. There is subtle areas of hazy opacity in the upper lobes that are likely due to the expiratory nature of the images and air trapping. No convincing pneumonia or pulmonary edema. No pneumothorax. Review of the MIP images confirms the above findings. CTA ABDOMEN AND PELVIS FINDINGS VASCULAR Aorta: Aorta is normal in caliber. No dissection. Minor atherosclerotic disease noted along the infrarenal abdominal aorta. Celiac: Patent without evidence of aneurysm, dissection, vasculitis or significant stenosis. SMA: Patent without evidence of aneurysm, dissection, vasculitis or significant stenosis. Renals: Small renal arteries. Are dual renal arteries on the right. No plaque or stenosis. No evidence of vasculitis or fibromuscular dysplasia. IMA: Patent without evidence of aneurysm, dissection, vasculitis or significant stenosis. Inflow: The iliac arteries are  relatively small. There is mild atherosclerotic disease along the common iliac arteries and internal iliac arteries. No hemodynamically significant stenosis of either common iliac or external iliac artery. Veins: No obvious venous abnormality within the limitations of this arterial  phase study. Review of the MIP images confirms the above findings. NON-VASCULAR Hepatobiliary: No focal liver abnormality is seen. Status post cholecystectomy. No biliary dilatation. Pancreas: Unremarkable. No pancreatic ductal dilatation or surrounding inflammatory changes. Spleen: Normal in size without focal abnormality. Adrenals/Urinary Tract: No adrenal masses. Mild bilateral renal cortical thinning. Nonobstructing stone in the lower pole the right kidney. No hydronephrosis. Ureters are normal course and caliber. No ureteral stones. Bladder is decompressed by a Foley catheter. Stomach/Bowel: Stomach is within normal limits. Appendix appears normal. No evidence of bowel wall thickening, distention, or inflammatory changes. Lymphatic: No pathologically enlarged lymph nodes. Reproductive: Unremarkable. Other: There is a small amount of pelvic free fluid MUSCULOSKELETAL FINDINGS No fracture or acute finding. No osteoblastic or osteolytic lesions. Review of the MIP images confirms the above findings. IMPRESSION: CTA CHEST 1. No evidence of a pulmonary embolism. 2. Small to moderate right and minimal left pleural effusions associated with dependent lower lobe atelectasis. 3. Mild hazy increased opacity in the upper lobes. This is most likely due to air trapping. 4. No convincing pneumonia.  No pulmonary edema. CTA ABDOMEN AND PELVIS 1. No aortic dissection or aneurysm. Minor aortic atherosclerotic change. Aortic branch vessels are widely patent. 2. No acute findings within the abdomen or pelvis. 3. Small amount of pelvic free fluid, nonspecific. 4. Small nonobstructing stone in the lower pole the right kidney. Electronically Signed   By:  Lajean Manes M.D.   On: 01/03/2018 10:06     Medications:   . sodium chloride    . DOPamine Stopped (01/03/18 2314)  . epinephrine Stopped (01/03/18 1900)  . famotidine (PEPCID) IV Stopped (01/03/18 1547)  . fentaNYL infusion INTRAVENOUS 350 mcg/hr (01/04/18 0237)  . heparin 1,800 Units/hr (01/04/18 0604)  . midazolam (VERSED) infusion 4 mg/hr (01/04/18 0945)  . norepinephrine (LEVOPHED) Adult infusion 8 mcg/min (01/04/18 0620)  . phenylephrine (NEO-SYNEPHRINE) Adult infusion Stopped (01/03/18 1555)  . piperacillin-tazobactam (ZOSYN)  IV 3.375 g (01/04/18 0600)  . pureflow 2,500 mL/hr at 01/04/18 0517  .  sodium bicarbonate  infusion 1000 mL 100 mL/hr at 01/04/18 0929  . vancomycin Stopped (01/04/18 0745)  . vasopressin (PITRESSIN) infusion - *FOR SHOCK* 0.03 Units/min (01/04/18 9233)   . acetylcysteine  1,200 mg Oral BID  . chlorhexidine gluconate (MEDLINE KIT)  15 mL Mouth Rinse BID  . insulin aspart  0-15 Units Subcutaneous TID WC  . insulin aspart  0-5 Units Subcutaneous QHS  . ipratropium-albuterol  3 mL Nebulization Q6H  . mouth rinse  15 mL Mouth Rinse 10 times per day  . polyvinyl alcohol  1 drop Both Eyes TID  . sodium chloride flush  3 mL Intravenous Q12H  . thiamine injection  100 mg Intravenous Daily   sodium chloride, acetaminophen **OR** acetaminophen, heparin, heparin, iopamidol, [DISCONTINUED] ondansetron **OR** ondansetron (ZOFRAN) IV, sodium chloride flush  Assessment/ Plan:  52 y.o. male with a PMHx of diabetes mellitus type 2, hypertension, chronic systolic heart failure ejection fraction 45%, who was admitted to [facility] on 01/04/2018 for evaluation of abdominal discomfort and bloating.   Patient had decompensation on January 03, 2018 with severe hypotension requiring multiple pressors, acute respiratory failure, and acute renal failure.  1.  Acute renal failure secondary to severe hypotension, and also exposed to contrast with CTA. 2.  Hyperkalemia. 3.  Metabolic  acidosis with elevated lactic acid level, pt was on metformin and TCAs as outpt.  4.  Acute respiratory failure. 5.  Elevated liver enzymes.  Plan: Patient remains critically ill  at this point in time.  Acidosis earlier this a.m. had improved however pH now down to 7.2 however PCO2 elevated at 67.  Recommend vent adjustments per critical care.  Otherwise we will continue CRRT at this time.  We will add ultrafiltration of 50 cc/h.  Sodium bicarbonate drip to be decreased to 100 cc/h.  Patient prognosis continues to be quite guarded at the moment.  We will continue to monitor his case very closely.     LOS: 1 Dorrie Cocuzza 6/23/20199:47 AM

## 2018-01-04 NOTE — Progress Notes (Signed)
ANTICOAGULATION CONSULT NOTE - Follow Up Consult  Pharmacy Consult for Heparin Drip Indication: atrial fibrillation  No Known Allergies  Patient Measurements: Height: 5\' 9"  (175.3 cm) Weight: (!) 309 lb 15.5 oz (140.6 kg) IBW/kg (Calculated) : 70.7 Heparin Dosing Weight: 98.9 kg  Vital Signs: BP: 85/47 (06/23 1400) Pulse Rate: 120 (06/23 1100)  Labs: Recent Labs    01/03/18 0516 01/03/18 0959  01/03/18 1518  01/03/18 2053  01/04/18 0415 01/04/18 0905 01/04/18 1408  HGB 12.2* 11.0*  --   --   --   --   --  10.0*  --   --   HCT 37.0* 33.4*  --   --   --   --   --  30.3*  --   --   PLT 365 213  --   --   --   --   --  96*  --   --   APTT 34 42*  --   --   --   --   --   --   --   --   LABPROT 16.1* 22.1*  --   --   --   --   --  26.1*  --   --   INR 1.30 1.95  --   --   --   --   --  2.42  --   --   HEPARINUNFRC  --   --   --  <0.10*  --  0.28*  --  0.32  --   --   CREATININE 1.30* 1.95*   < >  --    < >  --    < > 1.80* 1.77* 1.80*  CKTOTAL 180  --   --   --   --   --   --   --   --   --   TROPONINI 0.04* 0.04*  --  0.06*  --   --   --   --   --   --    < > = values in this interval not displayed.    Estimated Creatinine Clearance: 67.8 mL/min (A) (by C-G formula based on SCr of 1.8 mg/dL (H)).   Assessment: Patient is 52yo male admitted with tachycardia. He was initiated on Heparin drip this morning for possible PE. Second consult entered for Heparin for afib.  Heparin was running at 1600 units/hr, order was discontinued at ~13:00 and then 2nd consult entered.  6/23 Heparin currently running at 1800 units/hr. Noted that patient now with thrombocytopenia (possibly due to sepsis, could be DIC) MD aware and watching closely.  Goal of Therapy:  Heparin level 0.3-0.7 units/ml Monitor platelets by anticoagulation protocol: Yes   Plan:  Reordered Heparin level that was due earlier today. Will follow up on level.   Clovia CuffLisa Adonias Demore, PharmD, BCPS 01/04/2018 3:56 PM

## 2018-01-04 NOTE — Progress Notes (Signed)
CRITICAL VALUE ALERT  Critical Value: Lactic of 2.9, Calcium of 6.2  Date & Time Notied: 01/03/18  21:40  Provider Notified: Seward GraterMaggie, NP  Orders Received/Actions taken: Lactic trending down, results acknowledged. Calcium replacement ordered.

## 2018-01-04 NOTE — Progress Notes (Signed)
[facility] Cardiology  SUBJECTIVE: Patient on ventilator   Vitals:   01/04/18 0500 01/04/18 0600 01/04/18 0700 01/04/18 0800  BP: 99/70 94/67 (!) 88/70 92/66  Pulse: (!) 122 (!) 122 (!) 121   Resp: (!) 22 (!) 22 (!) 22 (!) 21  Temp:      TempSrc:      SpO2: 100% 100% 99%   Weight: (!) 140.6 kg (309 lb 15.5 oz)     Height:         Intake/Output Summary (Last 24 hours) at 01/04/2018 0900 Last data filed at 01/04/2018 4696 Gross per 24 hour  Intake 7402.81 ml  Output 950 ml  Net 6452.81 ml      PHYSICAL EXAM  General: Patient on ventilator HEENT:  Normocephalic and atramatic Neck:  No JVD.  Lungs: Clear bilaterally to auscultation and percussion. Heart: Tachycardia. Normal S1 and S2 without gallops or murmurs.  Abdomen: Bowel sounds are positive, abdomen soft and non-tender  Msk:  Back normal, normal gait. Normal strength and tone for age. Extremities: No clubbing, cyanosis or edema.   Neuro: Sedated Psych: Sedated   LABS: Basic Metabolic Panel: Recent Labs    01/04/18 0041 01/04/18 0415  NA 136 135  K 4.4 3.9  CL 101 103  CO2 24 25  GLUCOSE 189* 153*  BUN 34* 29*  CREATININE 2.08* 1.80*  CALCIUM 6.4* 6.7*  MG 1.8 1.6*  PHOS 5.9* 5.3*   Liver Function Tests: Recent Labs    01/03/18 0516 01/03/18 0959  01/04/18 0041 01/04/18 0415  AST 240* 498*  --   --   --   ALT 217* 404*  --   --   --   ALKPHOS 62 54  --   --   --   BILITOT 2.5* 4.0*  --   --   --   PROT 6.7 5.5*  --   --   --   ALBUMIN 3.6 2.9*   < > 2.8* 2.6*   < > = values in this interval not displayed.   Recent Labs    01/03/18 1044  LIPASE 52*  AMYLASE 45   CBC: Recent Labs    01/03/18 0959 01/04/18 0415  WBC 14.1* 21.8*  HGB 11.0* 10.0*  HCT 33.4* 30.3*  MCV 101.2* 98.6  PLT 213 96*   Cardiac Enzymes: Recent Labs    01/03/18 0516 01/03/18 0959 01/03/18 1518  CKTOTAL 180  --   --   TROPONINI 0.04* 0.04* 0.06*   BNP: Invalid input(s): POCBNP D-Dimer: No results for  input(s): DDIMER in the last 72 hours. Hemoglobin A1C: No results for input(s): HGBA1C in the last 72 hours. Fasting Lipid Panel: Recent Labs    01/03/18 0735  TRIG 101   Thyroid Function Tests: Recent Labs    12/22/2017 1152  TSH 4.922*   Anemia Panel: No results for input(s): VITAMINB12, FOLATE, FERRITIN, TIBC, IRON, RETICCTPCT in the last 72 hours.  Dg Chest 1 View  Result Date: 01/03/2018 CLINICAL DATA:  New intubation. EXAM: CHEST  1 VIEW COMPARISON:  01/03/2018 FINDINGS: Endotracheal tube has tip 4 cm above the carina. Nasogastric tube courses into the stomach and off the inferior portion of the film. Right IJ central venous catheter has tip over the SVC. Lungs are adequately inflated with new hazy prominence of the perihilar vasculature likely mild interstitial edema. Possible small amount right pleural fluid unchanged. No pneumothorax. Stable cardiomegaly. Remainder of the exam is unchanged. IMPRESSION: Findings suggesting new mild interstitial edema. Stable  small amount right pleural fluid. Stable cardiomegaly. Tubes and lines as described. Electronically Signed   By: Elberta Fortisaniel  Boyle M.D.   On: 01/03/2018 09:28   Dg Chest 2 View  Result Date: 01/05/2018 CLINICAL DATA:  Tachycardia and shortness of breath EXAM: CHEST - 2 VIEW COMPARISON:  10/16/2013 FINDINGS: The heart size and mediastinal contours are within normal limits. Both lungs are clear. The visualized skeletal structures are unremarkable. IMPRESSION: No acute abnormality noted. Electronically Signed   By: Alcide CleverMark  Lukens M.D.   On: 01/03/2018 11:40   Ct Head Wo Contrast  Result Date: 01/03/2018 CLINICAL DATA:  Altered mental status change since yesterday. Unresponsive. EXAM: CT HEAD WITHOUT CONTRAST TECHNIQUE: Contiguous axial images were obtained from the base of the skull through the vertex without intravenous contrast. COMPARISON:  None. FINDINGS: Brain: No evidence of acute infarction, hemorrhage, hydrocephalus, extra-axial  collection or mass lesion/mass effect. Vascular: No hyperdense vessel or unexpected calcification. Skull: Possible fracture along the posterolateral wall the right maxillary sinus as there is a small amount of air within the soft tissues just superficial to this region. Sinuses/Orbits: No acute finding. Other: None. IMPRESSION: No acute brain injury. Subtle air in the soft tissues just superficial to the posterolateral wall of the right maxillary sinus as cannot exclude a subtle fracture of the wall of the adjacent sinus wall. Recommend clinical correlation. Electronically Signed   By: Elberta Fortisaniel  Boyle M.D.   On: 01/03/2018 10:18   Ct Abdomen Pelvis W Contrast  Result Date: 12/13/2017 CLINICAL DATA:  Abdominal distension EXAM: CT ABDOMEN AND PELVIS WITH CONTRAST TECHNIQUE: Multidetector CT imaging of the abdomen and pelvis was performed using the standard protocol following bolus administration of intravenous contrast. CONTRAST:  100mL ISOVUE-300 IOPAMIDOL (ISOVUE-300) INJECTION 61% COMPARISON:  None. FINDINGS: Lower chest: Small right pleural effusion is noted. No focal infiltrate is identified. Hepatobiliary: Diffuse decreased attenuation in the liver is noted consistent with fatty infiltration. The gallbladder has been surgically removed. Pancreas: Unremarkable. No pancreatic ductal dilatation or surrounding inflammatory changes. Spleen: Normal in size without focal abnormality. Adrenals/Urinary Tract: Adrenal glands are within normal limits. The kidneys demonstrate a nonobstructing right lower pole renal stone. No obstructive changes are seen. No ureteral stones are noted. The bladder is well distended. Stomach/Bowel: Scattered diverticular changes noted. No evidence of diverticulitis is seen. No obstructive or inflammatory changes are noted. The appendix is within normal limits. Vascular/Lymphatic: No significant vascular findings are present. No enlarged abdominal or pelvic lymph nodes. Reproductive: Prostate  is unremarkable. Other: No abdominal wall hernia or abnormality. No abdominopelvic ascites. Musculoskeletal: Degenerative changes of lumbar spine are noted. IMPRESSION: Tiny nonobstructing right lower pole renal stone. Small right-sided pleural effusion. No other focal abnormality is noted to correspond with the patient's given clinical history. Electronically Signed   By: Alcide CleverMark  Lukens M.D.   On: 12/20/2017 13:30   Portable Chest Xray  Result Date: 01/04/2018 CLINICAL DATA:  Respiratory failure.  Follow-up exam. EXAM: PORTABLE CHEST 1 VIEW COMPARISON:  01/03/2018 and older studies. FINDINGS: Endotracheal tube, nasogastric tube and right internal jugular central venous line are stable. Vascular congestion, mild interstitial thickening and hazy lung base opacity, right greater than left, is similar to the prior exam allowing for differences in patient positioning. Lung base opacities consistent with combination of pleural effusions and atelectasis. No new lung abnormalities.  No pneumothorax. IMPRESSION: 1. No significant change from the most recent prior exam. 2. Persistent right greater than left pleural effusions with associated atelectasis. Vascular congestion without overt pulmonary  edema. 3. Stable well-positioned support apparatus. Electronically Signed   By: Amie Portland M.D.   On: 01/04/2018 08:01   Dg Chest Port 1 View  Result Date: 01/03/2018 CLINICAL DATA:  Sudden onset respiratory distress in inpatient. EXAM: PORTABLE CHEST 1 VIEW COMPARISON:  01-04-2018 FINDINGS: Cardiac enlargement. Pulmonary vascularity is normal. Probable small right pleural effusion developing since previous study. Atelectasis in the lung bases. No focal consolidation is suggested. No pneumothorax. IMPRESSION: Cardiac enlargement. Developing small right pleural effusion. Atelectasis in the lung bases. Electronically Signed   By: Burman Nieves M.D.   On: 01/03/2018 06:42   Ct Angio Chest/abd/pel For Dissection W And/or  W/wo  Result Date: 01/03/2018 CLINICAL DATA:  Pt intubated. Change in mental status since yesterday. Yesterday pt walking and talking, today pt unresponsive. Admitted to hospital with abdominal discomfort, SOB and tachycardia. Pt in acute respiratory distress. EXAM: CT ANGIOGRAPHY CHEST, ABDOMEN AND PELVIS TECHNIQUE: Multidetector CT imaging through the chest, abdomen and pelvis was performed using the standard protocol during bolus administration of intravenous contrast. Multiplanar reconstructed images and MIPs were obtained and reviewed to evaluate the vascular anatomy. CONTRAST:  ISOVUE-370 IOPAMIDOL (ISOVUE-370) INJECTION 76% COMPARISON:  Current chest radiograph. FINDINGS: CTA CHEST FINDINGS Cardiovascular: Nurse satisfactory to opacification the segmental of the pulmonary arteries level. Study is limited by respiratory motion. Allowing for this, there is no evidence of a pulmonary embolism. Heart is mildly enlarged. No pericardial effusion. Mild three-vessel coronary artery calcifications. Great vessels normal in caliber. Mild atherosclerotic calcifications noted along the aortic arch. Mediastinum/Nodes: No neck base or axillary masses or pathologically enlarged lymph nodes. There is mediastinal adenopathy. Largest prevascular node is 16 mm short axis. Largest right paratracheal node measures 3 cm in short axis. Nodes are predominantly shotty. Prominent nodes are noted along the hila, right greater than left. The trachea is patent. Endotracheal tube is well positioned. Nasal/orogastric tube passes below the diaphragm into the proximal to mid stomach. Lungs/Pleura: Small to moderate right and minimal left pleural effusions. There is dependent lower lobe opacity consistent with atelectasis. There is subtle areas of hazy opacity in the upper lobes that are likely due to the expiratory nature of the images and air trapping. No convincing pneumonia or pulmonary edema. No pneumothorax. Review of the MIP  images confirms the above findings. CTA ABDOMEN AND PELVIS FINDINGS VASCULAR Aorta: Aorta is normal in caliber. No dissection. Minor atherosclerotic disease noted along the infrarenal abdominal aorta. Celiac: Patent without evidence of aneurysm, dissection, vasculitis or significant stenosis. SMA: Patent without evidence of aneurysm, dissection, vasculitis or significant stenosis. Renals: Small renal arteries. Are dual renal arteries on the right. No plaque or stenosis. No evidence of vasculitis or fibromuscular dysplasia. IMA: Patent without evidence of aneurysm, dissection, vasculitis or significant stenosis. Inflow: The iliac arteries are relatively small. There is mild atherosclerotic disease along the common iliac arteries and internal iliac arteries. No hemodynamically significant stenosis of either common iliac or external iliac artery. Veins: No obvious venous abnormality within the limitations of this arterial phase study. Review of the MIP images confirms the above findings. NON-VASCULAR Hepatobiliary: No focal liver abnormality is seen. Status post cholecystectomy. No biliary dilatation. Pancreas: Unremarkable. No pancreatic ductal dilatation or surrounding inflammatory changes. Spleen: Normal in size without focal abnormality. Adrenals/Urinary Tract: No adrenal masses. Mild bilateral renal cortical thinning. Nonobstructing stone in the lower pole the right kidney. No hydronephrosis. Ureters are normal course and caliber. No ureteral stones. Bladder is decompressed by a Foley catheter. Stomach/Bowel: Stomach  is within normal limits. Appendix appears normal. No evidence of bowel wall thickening, distention, or inflammatory changes. Lymphatic: No pathologically enlarged lymph nodes. Reproductive: Unremarkable. Other: There is a small amount of pelvic free fluid MUSCULOSKELETAL FINDINGS No fracture or acute finding. No osteoblastic or osteolytic lesions. Review of the MIP images confirms the above findings.  IMPRESSION: CTA CHEST 1. No evidence of a pulmonary embolism. 2. Small to moderate right and minimal left pleural effusions associated with dependent lower lobe atelectasis. 3. Mild hazy increased opacity in the upper lobes. This is most likely due to air trapping. 4. No convincing pneumonia.  No pulmonary edema. CTA ABDOMEN AND PELVIS 1. No aortic dissection or aneurysm. Minor aortic atherosclerotic change. Aortic branch vessels are widely patent. 2. No acute findings within the abdomen or pelvis. 3. Small amount of pelvic free fluid, nonspecific. 4. Small nonobstructing stone in the lower pole the right kidney. Electronically Signed   By: Amie Portland M.D.   On: 01/03/2018 10:06     Echo LVEF 45 to 50%, moderate to severe tricuspid regurgitation, moderate right ventricular enlargement  TELEMETRY: Ectopic atrial tachycardia versus atrial flutter with RBBB:  ASSESSMENT AND PLAN:  Active Problems:   Tachyarrhythmia   Respiratory failure (HCC)   Shock (HCC)   Acute respiratory failure (HCC)    1.  Ectopic atrial tachycardia versus atrial flutter with RBBB at a rate of 110 bpm compensatory for underlying hypertension, respiratory failure 2.  Respiratory failure, on ventilator 3.  Septic shock versus metformin toxicity 4.  Known CAD, chronically occluded RCA, no recent history of chest pain, borderline elevated troponin, likely demand supply ischemia 5.  Mild cardiomyopathy, LVEF 45%, no significant change compared to prior 2D echocardiogram  Recommendations  1.  Consider amiodarone bolus and drip if tachycardia progresses  2.  No indication for invasive cardiac evaluation   Marcina Millard, MD, PhD, [facility] 01/04/2018 9:00 AM

## 2018-01-05 ENCOUNTER — Inpatient Hospital Stay: Payer: BLUE CROSS/BLUE SHIELD

## 2018-01-05 DIAGNOSIS — R740 Nonspecific elevation of levels of transaminase and lactic acid dehydrogenase [LDH]: Secondary | ICD-10-CM

## 2018-01-05 DIAGNOSIS — R7401 Elevation of levels of liver transaminase levels: Secondary | ICD-10-CM

## 2018-01-05 DIAGNOSIS — K759 Inflammatory liver disease, unspecified: Secondary | ICD-10-CM

## 2018-01-05 DIAGNOSIS — D65 Disseminated intravascular coagulation [defibrination syndrome]: Secondary | ICD-10-CM

## 2018-01-05 LAB — RENAL FUNCTION PANEL
ALBUMIN: 2.5 g/dL — AB (ref 3.5–5.0)
ALBUMIN: 2.3 g/dL — AB (ref 3.5–5.0)
ALBUMIN: 2.6 g/dL — AB (ref 3.5–5.0)
ALBUMIN: 2.6 g/dL — AB (ref 3.5–5.0)
ANION GAP: 8 (ref 5–15)
ANION GAP: 8 (ref 5–15)
ANION GAP: 8 (ref 5–15)
ANION GAP: 8 (ref 5–15)
Albumin: 2.5 g/dL — ABNORMAL LOW (ref 3.5–5.0)
Albumin: 2.5 g/dL — ABNORMAL LOW (ref 3.5–5.0)
Albumin: 2.6 g/dL — ABNORMAL LOW (ref 3.5–5.0)
Anion gap: 7 (ref 5–15)
Anion gap: 8 (ref 5–15)
Anion gap: 6 (ref 5–15)
BUN: 23 mg/dL — ABNORMAL HIGH (ref 6–20)
BUN: 23 mg/dL — ABNORMAL HIGH (ref 6–20)
BUN: 22 mg/dL — ABNORMAL HIGH (ref 6–20)
BUN: 23 mg/dL — ABNORMAL HIGH (ref 6–20)
BUN: 22 mg/dL — AB (ref 6–20)
BUN: 22 mg/dL — AB (ref 6–20)
BUN: 21 mg/dL — AB (ref 6–20)
CALCIUM: 7 mg/dL — AB (ref 8.9–10.3)
CALCIUM: 6.9 mg/dL — AB (ref 8.9–10.3)
CALCIUM: 7.3 mg/dL — AB (ref 8.9–10.3)
CALCIUM: 7.3 mg/dL — AB (ref 8.9–10.3)
CHLORIDE: 100 mmol/L — AB (ref 101–111)
CHLORIDE: 103 mmol/L (ref 101–111)
CO2: 26 mmol/L (ref 22–32)
CO2: 26 mmol/L (ref 22–32)
CO2: 25 mmol/L (ref 22–32)
CO2: 24 mmol/L (ref 22–32)
CO2: 23 mmol/L (ref 22–32)
CO2: 23 mmol/L (ref 22–32)
CO2: 26 mmol/L (ref 22–32)
CREATININE: 1.45 mg/dL — AB (ref 0.61–1.24)
CREATININE: 1.23 mg/dL (ref 0.61–1.24)
Calcium: 6.7 mg/dL — ABNORMAL LOW (ref 8.9–10.3)
Calcium: 7 mg/dL — ABNORMAL LOW (ref 8.9–10.3)
Calcium: 7.5 mg/dL — ABNORMAL LOW (ref 8.9–10.3)
Chloride: 100 mmol/L — ABNORMAL LOW (ref 101–111)
Chloride: 100 mmol/L — ABNORMAL LOW (ref 101–111)
Chloride: 102 mmol/L (ref 101–111)
Chloride: 103 mmol/L (ref 101–111)
Chloride: 102 mmol/L (ref 101–111)
Creatinine, Ser: 1.34 mg/dL — ABNORMAL HIGH (ref 0.61–1.24)
Creatinine, Ser: 1.06 mg/dL (ref 0.61–1.24)
Creatinine, Ser: 1.28 mg/dL — ABNORMAL HIGH (ref 0.61–1.24)
Creatinine, Ser: 1.16 mg/dL (ref 0.61–1.24)
Creatinine, Ser: 1.24 mg/dL (ref 0.61–1.24)
GFR calc Af Amer: >60 mL/min (ref >60)
GFR calc Af Amer: >60 mL/min (ref >60)
GFR calc Af Amer: >60 mL/min (ref >60)
GFR calc Af Amer: >60 mL/min (ref >60)
GFR calc Af Amer: >60 mL/min (ref >60)
GFR calc non Af Amer: 54 mL/min — ABNORMAL LOW (ref >60)
GFR calc non Af Amer: 60 mL/min — ABNORMAL LOW (ref >60)
GFR calc non Af Amer: >60 mL/min (ref >60)
GLUCOSE: 160 mg/dL — AB (ref 65–99)
GLUCOSE: 155 mg/dL — AB (ref 65–99)
GLUCOSE: 123 mg/dL — AB (ref 65–99)
GLUCOSE: 92 mg/dL (ref 65–99)
GLUCOSE: 102 mg/dL — AB (ref 65–99)
GLUCOSE: 103 mg/dL — AB (ref 65–99)
Glucose, Bld: 241 mg/dL — ABNORMAL HIGH (ref 65–99)
PHOSPHORUS: 3.5 mg/dL (ref 2.5–4.6)
PHOSPHORUS: 2.5 mg/dL (ref 2.5–4.6)
PHOSPHORUS: 2.6 mg/dL (ref 2.5–4.6)
PHOSPHORUS: 2.4 mg/dL — AB (ref 2.5–4.6)
POTASSIUM: 3 mmol/L — AB (ref 3.5–5.1)
POTASSIUM: 3.8 mmol/L (ref 3.5–5.1)
POTASSIUM: 4.1 mmol/L (ref 3.5–5.1)
Phosphorus: 3 mg/dL (ref 2.5–4.6)
Phosphorus: 3.1 mg/dL (ref 2.5–4.6)
Phosphorus: 2.6 mg/dL (ref 2.5–4.6)
Potassium: 3.9 mmol/L (ref 3.5–5.1)
Potassium: 3.6 mmol/L (ref 3.5–5.1)
Potassium: 3.7 mmol/L (ref 3.5–5.1)
Potassium: 3.7 mmol/L (ref 3.5–5.1)
SODIUM: 134 mmol/L — AB (ref 135–145)
Sodium: 134 mmol/L — ABNORMAL LOW (ref 135–145)
Sodium: 134 mmol/L — ABNORMAL LOW (ref 135–145)
Sodium: 134 mmol/L — ABNORMAL LOW (ref 135–145)
Sodium: 132 mmol/L — ABNORMAL LOW (ref 135–145)
Sodium: 133 mmol/L — ABNORMAL LOW (ref 135–145)
Sodium: 135 mmol/L (ref 135–145)

## 2018-01-05 LAB — CBC WITH DIFFERENTIAL/PLATELET
Basophils Absolute: 0 10*3/uL (ref 0–0.1)
Basophils Relative: 0 %
EOS ABS: 0.1 10*3/uL (ref 0–0.7)
EOS PCT: 1 %
HCT: 28.4 % — ABNORMAL LOW (ref 40.0–52.0)
HEMOGLOBIN: 9.5 g/dL — AB (ref 13.0–18.0)
LYMPHS PCT: 8 %
Lymphs Abs: 1 10*3/uL (ref 1.0–3.6)
MCH: 32.8 pg (ref 26.0–34.0)
MCHC: 33.5 g/dL (ref 32.0–36.0)
MCV: 97.8 fL (ref 80.0–100.0)
MONOS PCT: 5 %
Monocytes Absolute: 0.6 10*3/uL (ref 0.2–1.0)
NEUTROS ABS: 10.9 10*3/uL — AB (ref 1.4–6.5)
Neutrophils Relative %: 86 %
Platelets: 84 10*3/uL — ABNORMAL LOW (ref 150–440)
RBC: 2.9 MIL/uL — ABNORMAL LOW (ref 4.40–5.90)
RDW: 16.8 % — ABNORMAL HIGH (ref 11.5–14.5)
WBC: 12.6 10*3/uL — ABNORMAL HIGH (ref 3.8–10.6)

## 2018-01-05 LAB — BLOOD GAS, ARTERIAL
ACID-BASE EXCESS: 2.9 mmol/L — AB (ref 0.0–2.0)
ACID-BASE EXCESS: 1.9 mmol/L (ref 0.0–2.0)
Acid-Base Excess: 2.4 mmol/L — ABNORMAL HIGH (ref 0.0–2.0)
BICARBONATE: 31.6 mmol/L — AB (ref 20.0–28.0)
Bicarbonate: 29.4 mmol/L — ABNORMAL HIGH (ref 20.0–28.0)
Bicarbonate: 27.7 mmol/L (ref 20.0–28.0)
FIO2: 0.4
FIO2: 0.4
FIO2: 0.4
LHR: 25 {breaths}/min
Mechanical Rate: 25
O2 SAT: 92.9 %
O2 SAT: 97.1 %
O2 Saturation: 98.2 %
PCO2 ART: 72 mmHg — AB (ref 32.0–48.0)
PCO2 ART: 57 mmHg — AB (ref 32.0–48.0)
PCO2 ART: 48 mmHg (ref 32.0–48.0)
PEEP/CPAP: 5 cmH2O
PEEP: 5 cmH2O
PEEP: 5 cmH2O
PH ART: 7.25 — AB (ref 7.350–7.450)
Patient temperature: 37
Patient temperature: 37
Patient temperature: 37
RATE: 22 resp/min
VT: 500 mL
VT: 500 mL
VT: 500 mL
pH, Arterial: 7.32 — ABNORMAL LOW (ref 7.350–7.450)
pH, Arterial: 7.37 (ref 7.350–7.450)
pO2, Arterial: 77 mmHg — ABNORMAL LOW (ref 83.0–108.0)
pO2, Arterial: 99 mmHg (ref 83.0–108.0)
pO2, Arterial: 112 mmHg — ABNORMAL HIGH (ref 83.0–108.0)

## 2018-01-05 LAB — LACTIC ACID, PLASMA
LACTIC ACID, VENOUS: 1.6 mmol/L (ref 0.5–1.9)
LACTIC ACID, VENOUS: 2.3 mmol/L — AB (ref 0.5–1.9)
Lactic Acid, Venous: 1.5 mmol/L (ref 0.5–1.9)
Lactic Acid, Venous: 1.7 mmol/L (ref 0.5–1.9)
Lactic Acid, Venous: 1.6 mmol/L (ref 0.5–1.9)
Lactic Acid, Venous: 1.9 mmol/L (ref 0.5–1.9)

## 2018-01-05 LAB — GLUCOSE, CAPILLARY
GLUCOSE-CAPILLARY: 46 mg/dL — AB (ref 65–99)
GLUCOSE-CAPILLARY: 92 mg/dL (ref 65–99)
GLUCOSE-CAPILLARY: 96 mg/dL (ref 65–99)
Glucose-Capillary: 110 mg/dL — ABNORMAL HIGH (ref 65–99)
Glucose-Capillary: 86 mg/dL (ref 65–99)
Glucose-Capillary: 138 mg/dL — ABNORMAL HIGH (ref 65–99)

## 2018-01-05 LAB — HEPATIC FUNCTION PANEL
ALBUMIN: 2.4 g/dL — AB (ref 3.5–5.0)
ALK PHOS: 57 U/L (ref 38–126)
ALT: 2352 U/L — AB (ref 17–63)
AST: 1984 U/L — AB (ref 15–41)
BILIRUBIN INDIRECT: 1.1 mg/dL — AB (ref 0.3–0.9)
Bilirubin, Direct: 1.1 mg/dL — ABNORMAL HIGH (ref 0.1–0.5)
TOTAL PROTEIN: 4.6 g/dL — AB (ref 6.5–8.1)
Total Bilirubin: 2.2 mg/dL — ABNORMAL HIGH (ref 0.3–1.2)

## 2018-01-05 LAB — MAGNESIUM
MAGNESIUM: 1.7 mg/dL (ref 1.7–2.4)
MAGNESIUM: 1.8 mg/dL (ref 1.7–2.4)
Magnesium: 1.8 mg/dL (ref 1.7–2.4)
Magnesium: 1.8 mg/dL (ref 1.7–2.4)
Magnesium: 1.9 mg/dL (ref 1.7–2.4)
Magnesium: 2 mg/dL (ref 1.7–2.4)

## 2018-01-05 LAB — HEPARIN LEVEL (UNFRACTIONATED): Heparin Unfractionated: 0.52 IU/mL (ref 0.30–0.70)

## 2018-01-05 LAB — PROCALCITONIN: PROCALCITONIN: 4.37 ng/mL

## 2018-01-05 LAB — HIV ANTIBODY (ROUTINE TESTING W REFLEX): HIV Screen 4th Generation wRfx: NONREACTIVE

## 2018-01-05 LAB — PROTIME-INR
INR: 2.33
PROTHROMBIN TIME: 25.4 s — AB (ref 11.4–15.2)

## 2018-01-05 LAB — VANCOMYCIN, TROUGH: Vancomycin Tr: 7 ug/mL — ABNORMAL LOW (ref 15–20)

## 2018-01-05 MED ORDER — PUREFLOW DIALYSIS SOLUTION
INTRAVENOUS | Status: DC
  Administered 2018-01-05: 3 via INTRAVENOUS_CENTRAL
  Administered 2018-01-05 (×2): via INTRAVENOUS_CENTRAL
  Administered 2018-01-06 – 2018-01-07 (×4): 3 via INTRAVENOUS_CENTRAL
  Administered 2018-01-07 (×2): via INTRAVENOUS_CENTRAL
  Administered 2018-01-07 – 2018-01-08 (×2): 3 via INTRAVENOUS_CENTRAL
  Administered 2018-01-08 (×2): via INTRAVENOUS_CENTRAL

## 2018-01-05 MED ORDER — SODIUM CHLORIDE 0.9 % IV SOLN
1500.0000 mg | INTRAVENOUS | Status: DC
  Filled 2018-01-05: qty 1500

## 2018-01-05 MED ORDER — VANCOMYCIN HCL 10 G IV SOLR
1250.0000 mg | Freq: Two times a day (BID) | INTRAVENOUS | Status: DC
  Administered 2018-01-05: 1250 mg via INTRAVENOUS
  Filled 2018-01-05 (×2): qty 1250

## 2018-01-05 MED ORDER — VITAL HIGH PROTEIN PO LIQD
1000.0000 mL | ORAL | Status: DC
  Administered 2018-01-05 – 2018-01-08 (×4): 1000 mL

## 2018-01-05 MED ORDER — DEXTROSE 50 % IV SOLN
1.0000 | Freq: Once | INTRAVENOUS | Status: AC
  Administered 2018-01-05: 50 mL via INTRAVENOUS

## 2018-01-05 MED ORDER — PRO-STAT SUGAR FREE PO LIQD
60.0000 mL | Freq: Two times a day (BID) | ORAL | Status: DC
  Administered 2018-01-05 – 2018-01-07 (×6): 60 mL

## 2018-01-05 MED ORDER — INSULIN ASPART 100 UNIT/ML ~~LOC~~ SOLN
0.0000 [IU] | SUBCUTANEOUS | Status: DC
  Administered 2018-01-06 – 2018-01-08 (×8): 2 [IU] via SUBCUTANEOUS
  Administered 2018-01-08: 3 [IU] via SUBCUTANEOUS
  Administered 2018-01-08 (×2): 2 [IU] via SUBCUTANEOUS
  Administered 2018-01-09: 3 [IU] via SUBCUTANEOUS
  Administered 2018-01-09 – 2018-01-10 (×10): 2 [IU] via SUBCUTANEOUS
  Administered 2018-01-10 – 2018-01-11 (×5): 3 [IU] via SUBCUTANEOUS
  Administered 2018-01-12: 5 [IU] via SUBCUTANEOUS
  Administered 2018-01-12 (×5): 3 [IU] via SUBCUTANEOUS
  Administered 2018-01-13: 5 [IU] via SUBCUTANEOUS
  Administered 2018-01-13: 3 [IU] via SUBCUTANEOUS
  Administered 2018-01-13: 5 [IU] via SUBCUTANEOUS
  Administered 2018-01-13: 2 [IU] via SUBCUTANEOUS
  Administered 2018-01-13: 3 [IU] via SUBCUTANEOUS
  Administered 2018-01-13: 2 [IU] via SUBCUTANEOUS
  Administered 2018-01-14: 5 [IU] via SUBCUTANEOUS
  Administered 2018-01-14: 8 [IU] via SUBCUTANEOUS
  Administered 2018-01-14 (×2): 5 [IU] via SUBCUTANEOUS
  Administered 2018-01-14 – 2018-01-15 (×4): 3 [IU] via SUBCUTANEOUS
  Administered 2018-01-15 – 2018-01-16 (×5): 5 [IU] via SUBCUTANEOUS
  Administered 2018-01-16 (×4): 3 [IU] via SUBCUTANEOUS
  Administered 2018-01-16: 5 [IU] via SUBCUTANEOUS
  Administered 2018-01-16 – 2018-01-17 (×5): 3 [IU] via SUBCUTANEOUS
  Administered 2018-01-17: 2 [IU] via SUBCUTANEOUS
  Administered 2018-01-18 (×4): 3 [IU] via SUBCUTANEOUS
  Administered 2018-01-18: 2 [IU] via SUBCUTANEOUS
  Administered 2018-01-19 (×2): 3 [IU] via SUBCUTANEOUS
  Administered 2018-01-19 (×3): 2 [IU] via SUBCUTANEOUS
  Administered 2018-01-20: 3 [IU] via SUBCUTANEOUS
  Administered 2018-01-20: 1 [IU] via SUBCUTANEOUS
  Administered 2018-01-20 (×3): 2 [IU] via SUBCUTANEOUS
  Administered 2018-01-21: 3 [IU] via SUBCUTANEOUS
  Administered 2018-01-21 (×2): 2 [IU] via SUBCUTANEOUS
  Filled 2018-01-05 (×80): qty 1

## 2018-01-05 MED ORDER — OCUVITE-LUTEIN PO CAPS
1.0000 | ORAL_CAPSULE | Freq: Every day | ORAL | Status: DC
  Administered 2018-01-05 – 2018-01-13 (×8): 1
  Filled 2018-01-05 (×9): qty 1

## 2018-01-05 MED ORDER — VANCOMYCIN HCL 10 G IV SOLR: 1500.0000 mg | INTRAVENOUS | Status: DC

## 2018-01-05 MED ORDER — B COMPLEX-C PO TABS
1.0000 | ORAL_TABLET | Freq: Every day | ORAL | Status: DC
  Administered 2018-01-05 – 2018-01-12 (×8): 1
  Filled 2018-01-05 (×9): qty 1

## 2018-01-05 NOTE — Progress Notes (Signed)
CBG check on left hand 40, recheck from right hand was 46. Dr. Peggye Pittichards notified, asked for CBG to be checked from line. Recheck off of central line 86. Dr. Peggye Pittichards notified who gave verbal order for patient to get 1 amp of D50 and for CBG to be rechecked in an hour to hour and a half. Dr. Peggye Pittichards stated to continue to check CBGs from central line as patient's hands and fingers cold. Will continue to monitor.

## 2018-01-05 NOTE — Progress Notes (Signed)
Pharmacy Antibiotic Note  Ryan Webb is a 52 y.o. male admitted on 01/11/2018 with sepsis.  Pharmacy has been consulted for vanc/zosyn dosing. Patient received vanc 1.5g IV and zosyn 3.375g IV x 1. Patient is being started on CRRT  Plan: 06/24 @ 0600 VR 7. Ke 0.0800  T1/2 8 ~ 12 hrs Goal trough 15 - 20 mcg/mL Will start patient on vanc 1.25g IV q12h and will check a trough 06/25 @ 1800 prior to 4th dose.  Height: 5\' 9"  (175.3 cm) Weight: (!) 311 lb 8.2 oz (141.3 kg) IBW/kg (Calculated) : 70.7  Temp (24hrs), Avg:98 F (36.7 C), Min:97.8 F (36.6 C), Max:98.3 F (36.8 C)  Recent Labs  Lab 01/07/2018 1152 01/03/18 0516  01/03/18 0959  01/04/18 0415  01/04/18 1408 01/04/18 1628 01/04/18 2046 01/05/18 0105 01/05/18 0500 01/05/18 0530 01/05/18 0609  WBC 8.9 13.2*  --  14.1*  --  21.8*  --   --   --   --   --   --  12.6*  --   CREATININE 1.51* 1.30*  --  1.95*   < > 1.80*   < > 1.80* 1.67*  1.72* 1.64* 1.45*  --  1.34*  --   LATICACIDVEN  --  4.2*   < >  --    < >  --    < > 1.8 1.8 1.6 1.5 1.6  --   --   VANCOTROUGH  --   --   --   --   --   --   --   --   --   --   --   --   --  7*   < > = values in this interval not displayed.    Estimated Creatinine Clearance: 91.2 mL/min (A) (by C-G formula based on SCr of 1.34 mg/dL (H)).    No Known Allergies   Thank you for allowing pharmacy to be a part of this patient's care.  Thomasene Rippleavid Itha Kroeker, PharmD, BCPS Clinical Pharmacist 01/05/2018

## 2018-01-05 NOTE — Progress Notes (Signed)
[facility] [place] [facility]   PATIENT NAME: Ryan Webb    MR#:  [id number]  DATE OF BIRTH:  [birth year]  SUBJECTIVE:   Intubated and sedated on vent  Patient on multiple pressors as well as paralytic  REVIEW OF SYSTEMS:    unAble to obtain  tolerating Diet: Tube feeds        DRUG ALLERGIES:  No Known Allergies  VITALS:  Blood pressure 96/70, pulse (!) 121, temperature 98.1 F (36.7 C), temperature source Oral, resp. rate (!) 25, height 5\' 9"  (1.753 m), weight (!) 141.3 kg (311 lb 8.2 oz), SpO2 100 %.  PHYSICAL EXAMINATION:  Constitutional: Appears obese sedated on vent HENT: Normocephalic. . Intubated Eyes: , no scleral icterus.  Neck: Normal ROM. Neck supple. No JVD. No tracheal deviation. CVS: RRR, S1/S2 +, no murmurs, no gallops, no carotid bruit.  Pulmonary: Effort and breath sounds normal, no stridor, rhonchi, wheezes, rales.  Abdominal: Soft. BS +,  no distension, tenderness, rebound or guarding.  Musculoskeletal: sedated Neuro:sedated     . Skin: Skin is warm and dry. No rash noted. Psychiatric: sedated    LABORATORY PANEL:   CBC Recent Labs  Lab 01/05/18 0530  WBC 12.6*  HGB 9.5*  HCT 28.4*  PLT 84*   ------------------------------------------------------------------------------------------------------------------  Chemistries  Recent Labs  Lab 01/05/18 0530  01/05/18 1156  NA 134*   < > 132*  K 3.6   < > 3.7  CL 100*   < > 100*  CO2 26   < > 24  GLUCOSE 155*   < > 241*  BUN 23*   < > 23*  CREATININE 1.34*   < > 1.28*  CALCIUM 6.9*   < > 7.0*  MG 1.8   < > 1.8  AST 1,984*  --   --   ALT 2,352*  --   --   ALKPHOS 57  --   --   BILITOT 2.2*  --   --    < > = values in this interval not displayed.   ------------------------------------------------------------------------------------------------------------------  Cardiac Enzymes Recent Labs  Lab 01/03/18 0516 01/03/18 0959 01/03/18 1518  TROPONINI 0.04*  0.04* 0.06*   ------------------------------------------------------------------------------------------------------------------  RADIOLOGY:  Portable Chest Xray  Result Date: 01/04/2018 CLINICAL DATA:  Respiratory failure.  Follow-up exam. EXAM: PORTABLE CHEST 1 VIEW COMPARISON:  01/03/2018 and older studies. FINDINGS: Endotracheal tube, nasogastric tube and right internal jugular central venous line are stable. Vascular congestion, mild interstitial thickening and hazy lung base opacity, right greater than left, is similar to the prior exam allowing for differences in patient positioning. Lung base opacities consistent with combination of pleural effusions and atelectasis. No new lung abnormalities.  No pneumothorax. IMPRESSION: 1. No significant change from the most recent prior exam. 2. Persistent right greater than left pleural effusions with associated atelectasis. Vascular congestion without overt pulmonary edema. 3. Stable well-positioned support apparatus. Electronically Signed   By: Amie Portland M.D.   On: 01/04/2018 08:01     ASSESSMENT AND PLAN:   52 year old male with obesity, diabetes, hypertension came in with tachyarrhythmia and was noted to be in septic shock  1.  Severe sepsis-septic shock after presentation. -On 2 pressors in ICU. -Intubated and sedated.  Unknown source at this time. -Multiorgan failure at this time -On broad-spectrum antibiotics. -Management per ICU team  2.  Ectopic atrial tachycardia versus atrial flutter-remains tachycardic On heparin drip at this time. CT Angio of the chest and pelvis negative for any  PE or aortic dissection. Appreciate cardiology consult Echocardiogram with LV ejection fraction of 45% which is stable from prior.  3.  Acute renal failure-ATN secondary to septic shock.  Also had contrast exposure due to CT scans. Remains on CRRT at this time. On sodium bicarb drip. Management per nephrology  4.  Thrombocytopenia: This is  from sepsis and acute illness.     5.  Acute respiratory failure-secondary to sepsis and multiorgan failure Remains on ventilator and management per ICU team Continue sedation        Management plans discussed with nursing.  CODE STATUS: FULL  TOTAL TIME TAKING CARE OF THIS PATIENT: 21 minutes.     POSSIBLE D/C ??, DEPENDING ON CLINICAL CONDITION.   Barri Neidlinger M.D on 01/05/2018 at 12:49 PM  Between 7am to 6pm - Pager - [phone] After 6pm go to [email or web address] - password [email or web address]  [facility]  Office  [phone]  CC: Primary care physician; Anola Gurneyhauvin, Robert, PA  Note: This dictation was prepared with Dragon dictation along with smaller phrase technology. Any transcriptional errors that result from this process are unintentional.

## 2018-01-05 NOTE — Progress Notes (Signed)
ANTICOAGULATION CONSULT NOTE - Follow Up Consult  Pharmacy Consult for Heparin Drip Indication: atrial fibrillation  No Known Allergies  Patient Measurements: Height: 5\' 9"  (175.3 cm) Weight: (!) 311 lb 8.2 oz (141.3 kg) IBW/kg (Calculated) : 70.7 Heparin Dosing Weight: 98.9 kg  Vital Signs: Temp: 97.8 F (36.6 C) (06/24 0400) Temp Source: Oral (06/24 0400)  Labs: Recent Labs    01/03/18 0516 01/03/18 0959  01/03/18 1518  01/04/18 0415  01/04/18 1628 01/04/18 2046 01/05/18 0105 01/05/18 0530  HGB 12.2* 11.0*  --   --   --  10.0*  --   --   --   --  9.5*  HCT 37.0* 33.4*  --   --   --  30.3*  --   --   --   --  28.4*  PLT 365 213  --   --   --  96*  --   --   --   --  84*  APTT 34 42*  --   --   --   --   --   --   --   --   --   LABPROT 16.1* 22.1*  --   --   --  26.1*  --   --   --   --  25.4*  INR 1.30 1.95  --   --   --  2.42  --   --   --   --  2.33  HEPARINUNFRC  --   --   --  <0.10*   < > 0.32  --  0.52  --   --  0.52  CREATININE 1.30* 1.95*   < >  --    < > 1.80*   < > 1.67*  1.72* 1.64* 1.45* 1.34*  CKTOTAL 180  --   --   --   --   --   --   --   --   --   --   TROPONINI 0.04* 0.04*  --  0.06*  --   --   --   --   --   --   --    < > = values in this interval not displayed.    Estimated Creatinine Clearance: 91.2 mL/min (A) (by C-G formula based on SCr of 1.34 mg/dL (H)).   Assessment: Patient is 52yo male admitted with tachycardia. He was initiated on Heparin drip this morning for possible PE. Second consult entered for Heparin for afib.  Heparin was running at 1600 units/hr, order was discontinued at ~13:00 and then 2nd consult entered.  6/23 Heparin currently running at 1800 units/hr. Noted that patient now with thrombocytopenia (possibly due to sepsis, could be DIC) MD aware and watching closely.  Goal of Therapy:  Heparin level 0.3-0.7 units/ml Monitor platelets by anticoagulation protocol: Yes   Plan:  06/24 @ 0500 HL 0.52 therapeutic. Will  continue rate at 1800 units/hr and will recheck HL w/ am labs.  Thomasene Rippleavid Jinx Gilden, PharmD, BCPS Clinical Pharmacist 01/05/2018

## 2018-01-05 NOTE — Progress Notes (Signed)
   01/05/18 1005  Clinical Encounter Type  Visited With Patient and family together  Visit Type Follow-up  Spiritual Encounters  Spiritual Needs Emotional   Chaplain encountered a family member of patient in the elevator who suggested chaplain check in with patient's spouse Ryan Webb Ryan Webb.  Spouse was in room with patient and two visitors.  Chaplain maintained pastoral presence and offered emotional support as spouse updated friends.  One friend became tearful and needed to leave.  Spouse volunteered to walk them out, thanked chaplain for coming.  Chaplain encouraged spouse to have chaplain paged as needed.

## 2018-01-05 NOTE — Progress Notes (Signed)
[facility]  ROUNDING NOTE   Subjective:   Wife and sister at bedside.   CRRT 4K bath.   Requiring vasopressin and norepinephrine.    Objective:  Vital signs in last 24 hours:  Temp:  [97.8 F (36.6 C)-98.3 F (36.8 C)] 98.2 F (36.8 C) (06/24 0732) Pulse Rate:  [115-120] 117 (06/24 0915) Resp:  [0-25] 25 (06/24 1000) BP: (59-107)/(44-74) 79/63 (06/24 1000) SpO2:  [92 %-100 %] 94 % (06/24 0915) FiO2 (%):  [0.4 %-50 %] 0.4 % (06/24 0953) Weight:  [141.3 kg (311 lb 8.2 oz)] 141.3 kg (311 lb 8.2 oz) (06/24 0117)  Weight change: 0.7 kg (1 lb 8.7 oz) Filed Weights   01/08/2018 1750 01/04/18 0500 01/05/18 0117  Weight: 123.4 kg (272 lb 1.6 oz) (!) 140.6 kg (309 lb 15.5 oz) (!) 141.3 kg (311 lb 8.2 oz)    Intake/Output: I/O last 3 completed shifts: In: 7343.6 [I.V.:6110.9; NG/GT:120; IV Piggyback:1112.7] Out: 2591 [Urine:1275; Emesis/NG output:375; Other:941]   Intake/Output this shift:  Total I/O In: -  Out: 100 [Urine:100]  Physical Exam: General: Critically ill  Head: ETT  Eyes: Scleral + edema  Neck: trachea midline  Lungs:  PRVC 50%  Heart: regular  Abdomen:  Soft, nontender, bowel sounds    Extremities: ++edema  Neurologic: Intubated and sedated  Skin: Mottled appearance  Access: Right temporary femoral dialysis catheter    Basic Metabolic Panel: Recent Labs  Lab 01/04/18 1628 01/04/18 2046 01/05/18 0105 01/05/18 0530 01/05/18 0759  NA 135  135 135 134* 134* 134*  K 4.1  4.1 4.2 3.9 3.6 3.0*  CL 99*  100* 101 100* 100* 102  CO2 27  27 27 26 26 25   GLUCOSE 145*  146* 147* 160* 155* 123*  BUN 24*  25* 25* 23* 23* 22*  CREATININE 1.67*  1.72* 1.64* 1.45* 1.34* 1.06  CALCIUM 7.0*  7.1* 7.1* 7.0* 6.9* 6.7*  MG 1.9 1.9 1.8 1.8 1.7  PHOS 4.7*  4.6 4.4 3.5 3.0 2.5    Liver Function Tests: Recent Labs  Lab 01/03/18 0516 01/03/18 0959  01/04/18 1628 01/04/18 2046 01/05/18 0105 01/05/18 0530 01/05/18 0759  AST 240* 498*  --    --   --   --  1,984*  --   ALT 217* 404*  --   --   --   --  2,352*  --   ALKPHOS 62 54  --   --   --   --  57  --   BILITOT 2.5* 4.0*  --   --   --   --  2.2*  --   PROT 6.7 5.5*  --   --   --   --  4.6*  --   ALBUMIN 3.6 2.9*   < > 2.9*  3.0* 2.7* 2.5* 2.4*  2.5* 2.3*   < > = values in this interval not displayed.   Recent Labs  Lab 01/03/18 1044  LIPASE 52*  AMYLASE 45   Recent Labs  Lab 01/03/18 1518  AMMONIA 64*    CBC: Recent Labs  Lab 12/29/2017 1152 01/03/18 0516 01/03/18 0959 01/04/18 0415 01/05/18 0530  WBC 8.9 13.2* 14.1* 21.8* 12.6*  NEUTROABS  --   --   --   --  10.9*  HGB 11.7* 12.2* 11.0* 10.0* 9.5*  HCT 34.9* 37.0* 33.4* 30.3* 28.4*  MCV 98.6 101.4* 101.2* 98.6 97.8  PLT 304 365 213 96* 84*    Cardiac Enzymes: Recent Labs  Lab 01/01/2018 1152 01/03/18 0516 01/03/18 0959 01/03/18 1518  CKTOTAL  --  180  --   --   TROPONINI 0.04* 0.04* 0.04* 0.06*    BNP: Invalid input(s): POCBNP  CBG: Recent Labs  Lab 01/04/18 0716 01/04/18 1112 01/04/18 1628 01/04/18 2130 01/05/18 0719  GLUCAP 147* 106* 127* 121* 110*    Microbiology: Results for orders placed or performed during the hospital encounter of 12/19/2017  Culture, blood (routine x 2)     Status: None (Preliminary result)   Collection Time: 01/03/18 10:42 AM  Result Value Ref Range Status   Specimen Description BLOOD LT [facility]  Final   Special Requests   Final    BOTTLES DRAWN AEROBIC AND ANAEROBIC Blood Culture adequate volume   Culture   Final    NO GROWTH 2 DAYS Performed at [facility], [address] [place], [place] [address]    Report Status PENDING  Incomplete  MRSA PCR Screening     Status: None   Collection Time: 01/03/18 10:56 AM  Result Value Ref Range Status   MRSA by PCR NEGATIVE NEGATIVE Final    Comment:        The GeneXpert MRSA Assay (FDA approved for NASAL specimens only), is one component of a comprehensive MRSA colonization surveillance program. It  is not intended to diagnose MRSA infection nor to guide or monitor treatment for MRSA infections. Performed at [facility], [address] [place], [place] [address]   CULTURE, BLOOD (ROUTINE X 2) w Reflex to ID Panel     Status: None (Preliminary result)   Collection Time: 01/03/18  1:25 PM  Result Value Ref Range Status   Specimen Description BLOOD LINE  Final   Special Requests   Final    BOTTLES DRAWN AEROBIC AND ANAEROBIC Blood Culture adequate volume   Culture   Final    NO GROWTH 2 DAYS Performed at [facility], [address] [place], [place] [address]    Report Status PENDING  Incomplete  Culture, Urine     Status: None   Collection Time: 01/03/18  4:49 PM  Result Value Ref Range Status   Specimen Description   Final    URINE, RANDOM Performed at [facility], [address], [place], [place] [address]    Special Requests   Final    NONE Performed at [facility], [address] [place], [place] [address]    Culture   Final    NO GROWTH Performed at [facility], [place] [address] [place], [place] [address]    Report Status 01/04/2018 FINAL  Final  Culture, expectorated sputum-assessment     Status: None   Collection Time: 01/03/18  5:53 PM  Result Value Ref Range Status   Specimen Description TRACHEAL ASPIRATE  Final   Special Requests NONE  Final   Sputum evaluation   Final    THIS SPECIMEN IS ACCEPTABLE FOR SPUTUM CULTURE Performed at [facility], [address], [place], [place] [address]    Report Status 01/03/2018 FINAL  Final  Culture, respiratory (NON-Expectorated)     Status: None (Preliminary result)   Collection Time: 01/03/18  5:53 PM  Result Value Ref Range Status   Specimen Description   Final    TRACHEAL ASPIRATE Performed at [facility], [address], [place], [place] [address]    Special Requests   Final    NONE Reflexed from [id number] Performed at [place],  [place], [place] [address]    Gram Stain   Final    FEW WBC PRESENT, PREDOMINANTLY PMN RARE SQUAMOUS EPITHELIAL CELLS PRESENT FEW GRAM POSITIVE COCCI IN CLUSTERS FEW GRAM NEGATIVE COCCOBACILLI    Culture   Final    TOO YOUNG TO READ Performed at [facility], [place] [address], [place], [place] [address]    Report Status PENDING  Incomplete    Coagulation Studies: Recent Labs    01/03/18 0516 01/03/18 0959 01/04/18 0415 01/05/18 0530  LABPROT 16.1* 22.1* 26.1* 25.4*  INR 1.30 1.95 2.42 2.33    Urinalysis: Recent Labs    01/03/18 1649  COLORURINE AMBER*  LABSPEC 1.044*  PHURINE 5.0  GLUCOSEU 50*  HGBUR MODERATE*  BILIRUBINUR NEGATIVE  KETONESUR NEGATIVE  PROTEINUR 100*  NITRITE NEGATIVE  LEUKOCYTESUR NEGATIVE      Imaging: Portable Chest Xray  Result Date: 01/04/2018 CLINICAL DATA:  Respiratory failure.  Follow-up exam. EXAM: PORTABLE CHEST 1 VIEW COMPARISON:  01/03/2018 and older studies. FINDINGS: Endotracheal tube, nasogastric tube and right internal jugular central venous line are stable. Vascular congestion, mild interstitial thickening and hazy lung base opacity, right greater than left, is similar to the prior exam allowing for differences in patient positioning. Lung base opacities consistent with combination of pleural effusions and atelectasis. No new lung abnormalities.  No pneumothorax. IMPRESSION: 1. No significant change from the most recent prior exam. 2. Persistent right greater than left pleural effusions with associated atelectasis. Vascular congestion without overt pulmonary edema. 3. Stable well-positioned support apparatus. Electronically Signed   By: Lajean Manes M.D.   On: 01/04/2018 08:01     Medications:   . sodium chloride    . cisatracurium (NIMBEX) infusion 0.5 mcg/kg/min (01/05/18 0909)  . famotidine (PEPCID) IV Stopped (01/04/18 1615)  . feeding supplement (VITAL HIGH PROTEIN)    . fentaNYL infusion  INTRAVENOUS 350 mcg/hr (01/05/18 0805)  . heparin 1,800 Units/hr (01/05/18 0955)  . midazolam (VERSED) infusion 5 mg/hr (01/05/18 0911)  . norepinephrine (LEVOPHED) Adult infusion 12 mcg/min (01/05/18 0956)  . piperacillin-tazobactam (ZOSYN)  IV Stopped (01/05/18 0927)  . pureflow 2,500 mL/hr at 01/05/18 0953  . vasopressin (PITRESSIN) infusion - *FOR SHOCK* 0.03 Units/min (01/05/18 0857)   . B-complex with vitamin C  1 tablet Per Tube QHS  . chlorhexidine gluconate (MEDLINE KIT)  15 mL Mouth Rinse BID  . feeding supplement (PRO-STAT SUGAR FREE 64)  60 mL Per Tube BID  . insulin aspart  0-15 Units Subcutaneous Q4H  . ipratropium-albuterol  3 mL Nebulization Q6H  . mouth rinse  15 mL Mouth Rinse 10 times per day  . multivitamin-lutein  1 capsule Per Tube Daily  . polyvinyl alcohol  1 drop Both Eyes TID  . sodium chloride flush  3 mL Intravenous Q12H  . thiamine injection  100 mg Intravenous Daily   sodium chloride, acetaminophen **OR** acetaminophen, heparin, heparin, iopamidol, [DISCONTINUED] ondansetron **OR** ondansetron (ZOFRAN) IV, sodium chloride flush  Assessment/ Plan:  52 y.o.white male with diabetes mellitus type 2, hypertension, chronic systolic heart failure ejection fraction 45%, who was admitted to [facility] on 01/01/2018 for evaluation of abdominal discomfort and bloating.   Patient had decompensation on January 03, 2018 with severe hypotension requiring multiple pressors, acute respiratory failure, and acute renal failure.  1.  Acute renal failure secondary to severe hypotension, and also exposed to contrast with CTA. 2.  Hyperkalemia. 3.  Metabolic acidosis with elevated lactic acid level, pt was on metformin and  TCAs as outpt.  4.  Acute respiratory failure. 5.  Elevated liver enzymes.  Plan:  Continue CRRT.   LOS: 2 Duff Pozzi 6/24/201910:21 AM

## 2018-01-05 NOTE — Progress Notes (Signed)
PULMONARY / CRITICAL CARE MEDICINE   Name: Ryan Webb MRN: [id number] DOB: May 20, 1966    ADMISSION DATE:  12/15/2017  HISTORY OF PRESENT ILLNESS:   This is a52 y.o.malewith a known history of habitus mellitus type II, hypertension presented to the emergency room with abdominal discomfort and bloating.This started couple of days ago.Patient also has difficulty taking a deep breath because of the abdominal bloating.Last bowel movement was couple of days ago.Patient was found to be tachycardic when he presented to the emergency room.He has been evaluated for tachycardia by Dr. Lady Gary from cardiology and has been prescribed initially metoprolol patient could not tolerate the metoprolol and he was started on verapamil.Patient continues to be tachycardic even in the emergency room with a heart rate of 130 bpm.He was worked up with CT abdomen which showed no obstruction or any acute pathology.No complaints of any chest pain per se.Hospitalist service was consulted for further care.   REVIEW OF SYSTEMS:   On vent support; unattainable  SUBJECTIVE:  Comfortable on vent  VITAL SIGNS: BP 102/62 (BP Location: Right Arm)   Pulse (!) 118   Temp 98.2 F (36.8 C) (Oral)   Resp (!) 22   Ht 5\' 9"  (1.753 m)   Wt (!) 311 lb 8.2 oz (141.3 kg)   SpO2 100%   BMI 46.00 kg/m   HEMODYNAMICS:  on Vasopressin and Levophed for BP support  VENTILATOR SETTINGS: Vent Mode: PRVC FiO2 (%):  [40 %-50 %] 40 % Set Rate:  [22 bmp] 22 bmp Vt Set:  [500 mL] 500 mL PEEP:  [5 cmH20] 5 cmH20 Plateau Pressure:  [26 cmH20] 26 cmH20  INTAKE / OUTPUT: I/O last 3 completed shifts: In: 7343.6 [I.V.:6110.9; NG/GT:120; IV Piggyback:1112.7] Out: 2591 [Urine:1275; Emesis/NG output:375; Other:941]  PHYSICAL EXAMINATION: GENERAL:Obese, on mechanical ventilation EYES: Pupils equal, round, minimally reactive to light. No scleral icterus.  Decreased sclera oedema HEENT: Head atraumatic, normocephalic. ET  tube in place. NECK: Supple, no jugular venous distention. No thyroid enlargement, no tenderness.  LUNGS: Bilateral rhonchi CARDIOVASCULAR: S1, S2 tachycardia, IR IR. No murmurs, rubs, or gallops.  ABDOMEN: Soft, obese, inaudible BS. No organomegaly or mass.  EXTREMITIES: right calf > left, doppler pulses NEUROLOGIC: sedated, Nurse reported purposeful movements last evening PSYCHIATRIC: on vent SKIN: warm and dry   LABS:  BMET Recent Labs  Lab 01/05/18 0105 01/05/18 0530 01/05/18 0759  NA 134* 134* 134*  K 3.9 3.6 3.0*  CL 100* 100* 102  CO2 26 26 25   BUN 23* 23* 22*  CREATININE 1.45* 1.34* 1.06  GLUCOSE 160* 155* 123*    Electrolytes Recent Labs  Lab 01/05/18 0105 01/05/18 0530 01/05/18 0759  CALCIUM 7.0* 6.9* 6.7*  MG 1.8 1.8 1.7  PHOS 3.5 3.0 2.5    CBC Recent Labs  Lab 01/03/18 0959 01/04/18 0415 01/05/18 0530  WBC 14.1* 21.8* 12.6*  HGB 11.0* 10.0* 9.5*  HCT 33.4* 30.3* 28.4*  PLT 213 96* 84*    Coag's Recent Labs  Lab 01/03/18 0516 01/03/18 0959 01/04/18 0415 01/05/18 0530  APTT 34 42*  --   --   INR 1.30 1.95 2.42 2.33    Sepsis Markers Recent Labs  Lab 01/03/18 0516  01/04/18 0415  01/04/18 2046 01/05/18 0105 01/05/18 0500 01/05/18 0530  LATICACIDVEN 4.2*   < >  --    < > 1.6 1.5 1.6  --   PROCALCITON 0.10  --  4.75  --   --   --   --  4.37   < > =  values in this interval not displayed.    ABG Recent Labs  Lab 01/04/18 0101 01/04/18 0850 01/04/18 1301  PHART 7.34* 7.20* 7.24*  PCO2ART 51* 67* 49*  PO2ART 186* 77* 131*    Liver Enzymes Recent Labs  Lab 01/03/18 0516 01/03/18 0959  01/05/18 0105 01/05/18 0530 01/05/18 0759  AST 240* 498*  --   --  1,984*  --   ALT 217* 404*  --   --  2,352*  --   ALKPHOS 62 54  --   --  57  --   BILITOT 2.5* 4.0*  --   --  2.2*  --   ALBUMIN 3.6 2.9*   < > 2.5* 2.4*  2.5* 2.3*   < > = values in this interval not displayed.    Cardiac Enzymes Recent Labs  Lab  01/03/18 0516 01/03/18 0959 01/03/18 1518  TROPONINI 0.04* 0.04* 0.06*    Glucose Recent Labs  Lab 01/03/18 2148 01/04/18 0716 01/04/18 1112 01/04/18 1628 01/04/18 2130 01/05/18 0719  GLUCAP 179* 147* 106* 127* 121* 110*    Imaging No results found.   STUDIES:  6/22 ECHO  CULTURES: MRSA PCR- Neg  ANTIBIOTICS: 6/22Zosyn; Vancomycin  SIGNIFICANT EVENTS: 6/21 admission to hospital 6/22 transfer to ICU,    6./  LINES/TUBES: 6/22  Foley 6/22 R-IJ TLC, R- Femoral hemodialysis cath 6/22 L- Femoral Arterial line 6/22 Endotracheal tube   DISCUSSION: This 52 year old gentleman admitted with severe sepsis cultures negative, scans negative found to have acute renal failure with metformin toxicity and dye nephropathy.    ASSESSMENT / PLAN: 1. Severe sepsis with profound shock. CT scans have not identified a source of infection, all cultures remain negative Plan: will Continue Zosyn & Vancomycin renally dosed.  2. Severe Lactic acidosis R/O Metformin toxicity Lactic acidosis has resolved. Plan: Continue to trend daily  3. Acute Renal Failure with severe acidosis- multifactorial Dye Nephropathy, Metformin insult, sepsis Plan: CRRT, electrolytes replacement per Nephrology team  4. Acute hypoxic Respiratory failure on ventilatory support Plan: lung protective ventilation strategy, not ready for wean  5. Acute Ischemic Hepatitis.  Bilirubin has trended downward Plan: follow up Hepatitis  results  6. Atrial fibrillation/ flutter- rate stable Plan: heparin drip  7. DIC. Will consider switching Heparin to Argatroban if Plts continues to decrease particularly if CRRT continues Plan: monitor Platets levels.  Disp: Dietitian consult  FAMILY  - Updates: Wife updated  Thank you for allowing me the privileges to care for this patient.  I have dedicated a total of 45 minutes in critical care time minus all appropriate exclusions.   Jackson LatinoKarol Dailee Manalang,  MD Pulmonary and Critical Care Medicine [facility] Pager: [phone]  01/05/2018, 8:58 AM

## 2018-01-05 NOTE — Progress Notes (Addendum)
Called lab regarding renal function panel and magnesium that were drawn just before 1600 and sent down to lab, but had not resulted yet. Spoke with Susan who said that she didnDarl Pikes't see it as being marked received, but she would check and call me back. Darl PikesSusan called back and stated that she had gone through all the tubes that were there and they didn't have a light green top for this patient and that it would have to be redrawn. Tube sent down to lab with save tube label on it as unable to print bar coded label since it was a redraw. [facility] that there was only a save tube label on the tube.

## 2018-01-05 NOTE — Consult Note (Signed)
Midge Minium, MD [facility]  [address] [place], [place] [address] Phone: [phone] Fax : [phone]  Consultation  Referring Provider:     Dr. Juliene Pina Primary Care Physician:  Anola Gurney, [place] Primary Gastroenterologist: Gentry Fitz         Reason for Consultation:     Abnormal liver enzyme  Date of Admission:  12/29/2017 Date of Consultation:  01/05/2018         HPI:   Ryan Webb is a 52 y.o. male who was admitted with a rapid heart rate and a history of an ejection fraction of 45%.  The patient on admission was found to have her enzymes are elevated with an AST of 240 and ALT of 217 with a total bilirubin of 2.5.  The same day a few hours later the patient's liver enzymes went up to an AST of 498 with an ALT of 404 and the bilirubin increased to 4.0.  The liver enzymes were not checked again until today when the AST was 1984 with the ALT of 2352 and the bilirubin was found to be 2.2.  The patient has had increased lactic acid and sepsis with hypotension and had been intubated during this admission.  The patient has a history of diabetes type 2 and hypertension.  He also had a report of abdominal discomfort with bloating and was supposed to be seen at our office but had called to cancel the appointment due to a rapid heart rate.  In the emergency department the patient was found to have a heart rate of 130.  The patient has been followed by cardiology this admission.  The patient's lactic acid levels have improved.  The patient is also on hemodialysis for acute renal failure.  We are now being asked to see the patient for abnormal liver enzymes.  Past Medical History:  Diagnosis Date  . Diabetes mellitus without complication (HCC)   . Hypertension     Past Surgical History:  Procedure Laterality Date  . CHOLECYSTECTOMY  2000    Prior to Admission medications   Medication Sig Start Date End Date Taking? Authorizing Provider  aspirin EC 81 MG tablet Take 81 mg by mouth  daily.   Yes [provider]  atorvastatin (LIPITOR) 80 MG tablet Take 1 tablet (80 mg total) by mouth daily. 09/30/17  Yes Anola Gurney, PA  carvedilol (COREG) 3.125 MG tablet Take 3.125 mg by mouth 2 (two) times daily with a meal.  11/19/16  Yes [provider]  clomiPRAMINE (ANAFRANIL) 50 MG capsule TAKE 2 CAPSULES BY MOUTH AT BEDTIME 12/09/17  Yes Chauvin, Molly Maduro, PA  furosemide (LASIX) 20 MG tablet Take 20 mg by mouth daily.  11/19/16  Yes [provider]  lansoprazole (PREVACID) 30 MG capsule Take 1 capsule (30 mg total) by mouth daily at 12 noon. 12/19/17  Yes Anola Gurney, PA  lisinopril (PRINIVIL,ZESTRIL) 2.5 MG tablet Take 2.5 mg by mouth daily.  11/19/16  Yes [provider]  metFORMIN (GLUCOPHAGE) 1000 MG tablet Take 1 tablet (1,000 mg total) by mouth 2 (two) times daily with a meal. 12/19/17  Yes Anola Gurney, PA  metoprolol tartrate (LOPRESSOR) 25 MG tablet Take 25 mg by mouth 2 (two) times daily. 12/23/17  Yes [provider]  sucralfate (CARAFATE) 1 g tablet Take 1 tablet (1 g total) by mouth 4 (four) times daily -  with meals and at bedtime. 12/19/17  Yes Anola Gurney, PA  verapamil (CALAN) 40 MG tablet Take  40 mg by mouth 2 (two) times daily. 12/25/17  Yes [provider]  glucose blood test strip Check sugar twice daily 01/23/17   Anola Gurneyhauvin, Robert, PA    Family History  Problem Relation Age of Onset  . COPD Father   . Diabetes Father   . Diabetes Mother      Social History   Tobacco Use  . Smoking status: Never Smoker  . Smokeless tobacco: Never Used  Substance Use Topics  . Alcohol use: No  . Drug use: No    Allergies as of 12/07/17  . (No Known Allergies)    Review of Systems:    All systems reviewed and negative except where noted in HPI.   Physical Exam:  Vital signs in last 24 hours: Temp:  [97.8 F (36.6 C)-98.3 F (36.8 C)] 98.1 F (36.7 C) (06/24 1200) Pulse Rate:  [115-146] 123 (06/24  1300) Resp:  [22-25] 25 (06/24 1300) BP: (59-107)/(44-74) 93/54 (06/24 1300) SpO2:  [78 %-100 %] 100 % (06/24 1327) FiO2 (%):  [0.4 %-50 %] 40 % (06/24 1327) Weight:  [311 lb 8.2 oz (141.3 kg)] 311 lb 8.2 oz (141.3 kg) (06/24 0117) Last BM Date: 01/01/18 General:   Intubated and paralyzed Head:  Normocephalic and atraumatic. Eyes:   No icterus.    Ears: Unable to assess Neck:  Supple; no masses or thyroidomegaly Lungs: Respirations even and unlabored. Lungs clear to auscultation bilaterally.   No wheezes, crackles, or rhonchi.  Heart: Tachycardia with irregular rhythm;  Without murmur, clicks, rubs or gallops Abdomen: Positive distention with tympany and absence of bowel sounds Rectal:  Not performed. Msk:  Symmetrical without gross deformities.    Extremities: Positive edema on the upper and lower extremities Neurologic: Intubated and paralyzed Skin:  Intact without significant lesions or rashes. Cervical Nodes:  No significant cervical adenopathy. Psych: Intubated and paralyzed  LAB RESULTS: Recent Labs    01/03/18 0959 01/04/18 0415 01/05/18 0530  WBC 14.1* 21.8* 12.6*  HGB 11.0* 10.0* 9.5*  HCT 33.4* 30.3* 28.4*  PLT 213 96* 84*   BMET Recent Labs    01/05/18 0530 01/05/18 0759 01/05/18 1156  NA 134* 134* 132*  K 3.6 3.0* 3.7  CL 100* 102 100*  CO2 26 25 24   GLUCOSE 155* 123* 241*  BUN 23* 22* 23*  CREATININE 1.34* 1.06 1.28*  CALCIUM 6.9* 6.7* 7.0*   LFT Recent Labs    01/05/18 0530  01/05/18 1156  PROT 4.6*  --   --   ALBUMIN 2.4*  2.5*   < > 2.5*  AST 1,984*  --   --   ALT 2,352*  --   --   ALKPHOS 57  --   --   BILITOT 2.2*  --   --   BILIDIR 1.1*  --   --   IBILI 1.1*  --   --    < > = values in this interval not displayed.   PT/INR Recent Labs    01/04/18 0415 01/05/18 0530  LABPROT 26.1* 25.4*  INR 2.42 2.33    STUDIES: Portable Chest Xray  Result Date: 01/04/2018 CLINICAL DATA:  Respiratory failure.  Follow-up exam. EXAM:  PORTABLE CHEST 1 VIEW COMPARISON:  01/03/2018 and older studies. FINDINGS: Endotracheal tube, nasogastric tube and right internal jugular central venous line are stable. Vascular congestion, mild interstitial thickening and hazy lung base opacity, right greater than left, is similar to the prior exam allowing for differences in patient positioning. Lung base opacities  consistent with combination of pleural effusions and atelectasis. No new lung abnormalities.  No pneumothorax. IMPRESSION: 1. No significant change from the most recent prior exam. 2. Persistent right greater than left pleural effusions with associated atelectasis. Vascular congestion without overt pulmonary edema. 3. Stable well-positioned support apparatus. Electronically Signed   By: Amie Portland M.D.   On: 01/04/2018 08:01      Impression / Plan:   Assessment: Active Problems:   Tachyarrhythmia   Respiratory failure (HCC)   Shock (HCC)   Acute respiratory failure (HCC)   Ryan Webb is a 52 y.o. y/o male with an irregular heartbeat with a history of abdominal distention and discomfort who has decompensated during this hospitalization with the need for pressors and intubation.  The patient's likely cause for his abnormal liver enzymes are shock liver.  The patient has an acute hepatitis panel pending.  Plan: - Conservative management for shock liver with pressors and fluids as needed. - The best plan for reversing the shock liver is by removing the cause such as the patient's hypotension and sepsis. - Acute hepatitis panel pending. - Follow daily liver enzymes.   Thank you for involving me in the care of this patient.      LOS: 2 days   Midge Minium, MD  01/05/2018, 2:07 PM    Note: This dictation was prepared with Dragon dictation along with smaller phrase technology. Any transcriptional errors that result from this process are unintentional.

## 2018-01-06 DIAGNOSIS — A419 Sepsis, unspecified organism: Principal | ICD-10-CM

## 2018-01-06 LAB — HEPATIC FUNCTION PANEL
ALK PHOS: 58 U/L (ref 38–126)
ALT: 1886 U/L — AB (ref 0–44)
AST: 842 U/L — ABNORMAL HIGH (ref 15–41)
Albumin: 2.4 g/dL — ABNORMAL LOW (ref 3.5–5.0)
BILIRUBIN INDIRECT: 1 mg/dL — AB (ref 0.3–0.9)
BILIRUBIN TOTAL: 2 mg/dL — AB (ref 0.3–1.2)
Bilirubin, Direct: 1 mg/dL — ABNORMAL HIGH (ref 0.0–0.2)
Total Protein: 4.8 g/dL — ABNORMAL LOW (ref 6.5–8.1)

## 2018-01-06 LAB — CULTURE, RESPIRATORY W GRAM STAIN

## 2018-01-06 LAB — RENAL FUNCTION PANEL
ALBUMIN: 2.4 g/dL — AB (ref 3.5–5.0)
ALBUMIN: 2.3 g/dL — AB (ref 3.5–5.0)
ANION GAP: 4 — AB (ref 5–15)
ANION GAP: 4 — AB (ref 5–15)
ANION GAP: 8 (ref 5–15)
Albumin: 2.3 g/dL — ABNORMAL LOW (ref 3.5–5.0)
Albumin: 2.5 g/dL — ABNORMAL LOW (ref 3.5–5.0)
Albumin: 3 g/dL — ABNORMAL LOW (ref 3.5–5.0)
Albumin: 2.7 g/dL — ABNORMAL LOW (ref 3.5–5.0)
Anion gap: 7 (ref 5–15)
Anion gap: 5 (ref 5–15)
Anion gap: 8 (ref 5–15)
BUN: 23 mg/dL — AB (ref 6–20)
BUN: 26 mg/dL — AB (ref 6–20)
BUN: 28 mg/dL — AB (ref 6–20)
BUN: 30 mg/dL — ABNORMAL HIGH (ref 6–20)
BUN: 30 mg/dL — ABNORMAL HIGH (ref 6–20)
BUN: 33 mg/dL — AB (ref 6–20)
CALCIUM: 7.2 mg/dL — AB (ref 8.9–10.3)
CALCIUM: 7.5 mg/dL — AB (ref 8.9–10.3)
CALCIUM: 7.7 mg/dL — AB (ref 8.9–10.3)
CHLORIDE: 104 mmol/L (ref 98–111)
CHLORIDE: 103 mmol/L (ref 98–111)
CO2: 24 mmol/L (ref 22–32)
CO2: 24 mmol/L (ref 22–32)
CO2: 24 mmol/L (ref 22–32)
CO2: 26 mmol/L (ref 22–32)
CO2: 26 mmol/L (ref 22–32)
CO2: 26 mmol/L (ref 22–32)
CREATININE: 1.24 mg/dL (ref 0.61–1.24)
CREATININE: 1.24 mg/dL (ref 0.61–1.24)
Calcium: 7.5 mg/dL — ABNORMAL LOW (ref 8.9–10.3)
Calcium: 7.5 mg/dL — ABNORMAL LOW (ref 8.9–10.3)
Calcium: 7.6 mg/dL — ABNORMAL LOW (ref 8.9–10.3)
Chloride: 103 mmol/L (ref 98–111)
Chloride: 105 mmol/L (ref 98–111)
Chloride: 104 mmol/L (ref 98–111)
Chloride: 103 mmol/L (ref 98–111)
Creatinine, Ser: 1.22 mg/dL (ref 0.61–1.24)
Creatinine, Ser: 1.13 mg/dL (ref 0.61–1.24)
Creatinine, Ser: 1.15 mg/dL (ref 0.61–1.24)
Creatinine, Ser: 1.17 mg/dL (ref 0.61–1.24)
GFR calc Af Amer: >60 mL/min (ref >60)
GFR calc Af Amer: >60 mL/min (ref >60)
GFR calc non Af Amer: >60 mL/min (ref >60)
GFR calc non Af Amer: >60 mL/min (ref >60)
GFR calc non Af Amer: >60 mL/min (ref >60)
GLUCOSE: 132 mg/dL — AB (ref 70–99)
GLUCOSE: 144 mg/dL — AB (ref 70–99)
Glucose, Bld: 130 mg/dL — ABNORMAL HIGH (ref 70–99)
Glucose, Bld: 165 mg/dL — ABNORMAL HIGH (ref 70–99)
Glucose, Bld: 148 mg/dL — ABNORMAL HIGH (ref 70–99)
Glucose, Bld: 142 mg/dL — ABNORMAL HIGH (ref 70–99)
PHOSPHORUS: 2.3 mg/dL — AB (ref 2.5–4.6)
PHOSPHORUS: 2.3 mg/dL — AB (ref 2.5–4.6)
POTASSIUM: 3.9 mmol/L (ref 3.5–5.1)
POTASSIUM: 3.8 mmol/L (ref 3.5–5.1)
POTASSIUM: 3.7 mmol/L (ref 3.5–5.1)
Phosphorus: 2.5 mg/dL (ref 2.5–4.6)
Phosphorus: 2.4 mg/dL — ABNORMAL LOW (ref 2.5–4.6)
Phosphorus: 2.4 mg/dL — ABNORMAL LOW (ref 2.5–4.6)
Phosphorus: 2.6 mg/dL (ref 2.5–4.6)
Potassium: 3.7 mmol/L (ref 3.5–5.1)
Potassium: 3.7 mmol/L (ref 3.5–5.1)
Potassium: 3.8 mmol/L (ref 3.5–5.1)
SODIUM: 134 mmol/L — AB (ref 135–145)
SODIUM: 134 mmol/L — AB (ref 135–145)
SODIUM: 135 mmol/L (ref 135–145)
Sodium: 136 mmol/L (ref 135–145)
Sodium: 134 mmol/L — ABNORMAL LOW (ref 135–145)
Sodium: 135 mmol/L (ref 135–145)

## 2018-01-06 LAB — CULTURE, RESPIRATORY: CULTURE: NORMAL

## 2018-01-06 LAB — BLOOD GAS, ARTERIAL
ACID-BASE DEFICIT: 4.9 mmol/L — AB (ref 0.0–2.0)
Acid-Base Excess: 0.3 mmol/L (ref 0.0–2.0)
Acid-base deficit: 0.4 mmol/L (ref 0.0–2.0)
BICARBONATE: 26 mmol/L (ref 20.0–28.0)
BICARBONATE: 26.6 mmol/L (ref 20.0–28.0)
Bicarbonate: 22 mmol/L (ref 20.0–28.0)
FIO2: 0.8
FIO2: 40
FIO2: 0.4
LHR: 22 {breaths}/min
LHR: 25 {breaths}/min
MECHANICAL RATE: 25
MECHVT: 500 mL
MECHVT: 500 mL
O2 SAT: 99.3 %
O2 SAT: 98.2 %
O2 Saturation: 96.2 %
PCO2 ART: 48 mmHg (ref 32.0–48.0)
PEEP/CPAP: 5 cmH2O
PEEP/CPAP: 5 cmH2O
PEEP: 5 cmH2O
PH ART: 7.3 — AB (ref 7.350–7.450)
PO2 ART: 169 mmHg — AB (ref 83.0–108.0)
Patient temperature: 37
Patient temperature: 37
Patient temperature: 37
VT: 500 mL
pCO2 arterial: 46 mmHg (ref 32.0–48.0)
pCO2 arterial: 54 mmHg — ABNORMAL HIGH (ref 32.0–48.0)
pH, Arterial: 7.27 — ABNORMAL LOW (ref 7.350–7.450)
pH, Arterial: 7.36 (ref 7.350–7.450)
pO2, Arterial: 113 mmHg — ABNORMAL HIGH (ref 83.0–108.0)
pO2, Arterial: 92 mmHg (ref 83.0–108.0)

## 2018-01-06 LAB — HEPATITIS PANEL, ACUTE
HCV Ab: <0.1 s/co ratio (ref 0.0–0.9)
HEP B C IGM: NEGATIVE
HEP B S AG: NEGATIVE
Hep A IgM: NEGATIVE

## 2018-01-06 LAB — GLUCOSE, CAPILLARY
GLUCOSE-CAPILLARY: 116 mg/dL — AB (ref 70–99)
GLUCOSE-CAPILLARY: 130 mg/dL — AB (ref 70–99)
GLUCOSE-CAPILLARY: 130 mg/dL — AB (ref 70–99)
GLUCOSE-CAPILLARY: 139 mg/dL — AB (ref 70–99)
GLUCOSE-CAPILLARY: 145 mg/dL — AB (ref 70–99)
Glucose-Capillary: 40 mg/dL — CL (ref 65–99)
Glucose-Capillary: 119 mg/dL — ABNORMAL HIGH (ref 70–99)
Glucose-Capillary: 135 mg/dL — ABNORMAL HIGH (ref 70–99)

## 2018-01-06 LAB — LACTIC ACID, PLASMA
LACTIC ACID, VENOUS: 1.4 mmol/L (ref 0.5–1.9)
LACTIC ACID, VENOUS: 1.4 mmol/L (ref 0.5–1.9)
Lactic Acid, Venous: 1.5 mmol/L (ref 0.5–1.9)
Lactic Acid, Venous: 1.6 mmol/L (ref 0.5–1.9)

## 2018-01-06 LAB — TRIGLYCERIDES: Triglycerides: 108 mg/dL (ref <150)

## 2018-01-06 LAB — PROCALCITONIN: PROCALCITONIN: 3.93 ng/mL

## 2018-01-06 LAB — VOLATILES,BLD-ACETONE,ETHANOL,ISOPROP,METHANOL
ACETONE, BLOOD: NEGATIVE % (ref 0.000–0.010)
ETHANOL, BLOOD: NEGATIVE % (ref 0.000–0.010)
Isopropanol, blood: NEGATIVE % (ref 0.000–0.010)
METHANOL, BLOOD: NEGATIVE % (ref 0.000–0.010)

## 2018-01-06 LAB — HEPARIN LEVEL (UNFRACTIONATED): Heparin Unfractionated: 0.52 IU/mL (ref 0.30–0.70)

## 2018-01-06 LAB — CALCIUM, IONIZED
CALCIUM, IONIZED, SERUM: 4 mg/dL — AB (ref 4.5–5.6)
CALCIUM, IONIZED, SERUM: 4.1 mg/dL — AB (ref 4.5–5.6)
CALCIUM, IONIZED, SERUM: 4.3 mg/dL — AB (ref 4.5–5.6)
Calcium, Ionized, Serum: 3.2 mg/dL — ABNORMAL LOW (ref 4.5–5.6)
Calcium, Ionized, Serum: 3.8 mg/dL — ABNORMAL LOW (ref 4.5–5.6)

## 2018-01-06 LAB — MAGNESIUM
MAGNESIUM: 1.9 mg/dL (ref 1.7–2.4)
Magnesium: 1.8 mg/dL (ref 1.7–2.4)
Magnesium: 1.9 mg/dL (ref 1.7–2.4)
Magnesium: 1.9 mg/dL (ref 1.7–2.4)

## 2018-01-06 MED ORDER — FAMOTIDINE 20 MG PO TABS
20.0000 mg | ORAL_TABLET | Freq: Two times a day (BID) | ORAL | Status: DC
  Administered 2018-01-07 – 2018-01-17 (×21): 20 mg
  Filled 2018-01-06 (×21): qty 1

## 2018-01-06 MED ORDER — ALBUMIN HUMAN 25 % IV SOLN
25.0000 g | Freq: Three times a day (TID) | INTRAVENOUS | Status: DC
  Administered 2018-01-06 – 2018-01-08 (×6): 25 g via INTRAVENOUS
  Filled 2018-01-06 (×9): qty 100

## 2018-01-06 NOTE — Progress Notes (Signed)
PHARMACIST - PHYSICIAN COMMUNICATION  CONCERNING: IV to Oral Route Change Policy  RECOMMENDATION: This patient is receiving famotidine by the intravenous route.  Based on criteria approved by the Pharmacy and Therapeutics Committee, the intravenous medication(s) is/are being converted to the equivalent oral dose form(s).   DESCRIPTION: These criteria include:  The patient is eating (either orally or via tube) and/or has been taking other orally administered medications for a least 24 hours  The patient has no evidence of active gastrointestinal bleeding or impaired GI absorption (gastrectomy, short bowel, patient on TNA or NPO).  If you have questions about this conversion, please contact the Pharmacy Department  []   [phone]( [phone] )  [facility] [x]   [phone]( [phone] )  [facility] []   [phone]( [phone] )  [facility] []   [phone]( [phone] )  [facility] []   [phone]( [phone] )  [facility]   Simpson,Michael L, Casa AmistadRPH 01/06/2018 10:32 PM

## 2018-01-06 NOTE — Progress Notes (Signed)
[facility]  ROUNDING NOTE   Subjective:   Wife and Mother at bedside.   CRRT 4K bath. 55m/hr ultrafiltration.  UF 10461m Weaned off vasopressin  Low dose norepinephrine infusion.    Objective:  Vital signs in last 24 hours:  Temp:  [97.8 F (36.6 C)-98.8 F (37.1 C)] 97.9 F (36.6 C) (06/25 0800) Pulse Rate:  [120-146] 120 (06/25 0800) Resp:  [19-25] 19 (06/25 0800) BP: (80-103)/(54-76) 80/61 (06/25 0800) SpO2:  [78 %-100 %] 92 % (06/25 0800) FiO2 (%):  [40 %] 40 % (06/25 0924) Weight:  [142.8 kg (314 lb 13.1 oz)] 142.8 kg (314 lb 13.1 oz) (06/25 0100)  Weight change: 1.5 kg (3 lb 4.9 oz) Filed Weights   01/04/18 0500 01/05/18 0117 01/06/18 0100  Weight: (!) 140.6 kg (309 lb 15.5 oz) (!) 141.3 kg (311 lb 8.2 oz) (!) 142.8 kg (314 lb 13.1 oz)    Intake/Output: I/O last 3 completed shifts: In: 7026.1 [I.V.:5934.8; NG/GT:789; IV Piggyback:302.3] Out: 2476 [Urine:905; Other:1571]   Intake/Output this shift:  Total I/O In: 341.9 [I.V.:125.2; NG/GT:120; IV Piggyback:96.7] Out: 30 [Urine:30]  Physical Exam: General: Critically ill  Head: ETT, OGT  Eyes: Scleral + edema  Neck: trachea midline  Lungs:  PRVC 40%  Heart: tachycardia  Abdomen:  Soft, nontender, bowel sounds    Extremities: ++edema  Neurologic: Intubated and sedated  Skin: Mottled appearance  Access: Right temporary femoral dialysis catheter    Basic Metabolic Panel: Recent Labs  Lab 01/05/18 1550 01/05/18 1719 01/05/18 2018 01/06/18 0037 01/06/18 0419 01/06/18 0758  NA 134* 133* 135 134* 136 134*  K 3.8 3.7 4.1 3.7 3.9 3.8  CL 103 102 103 103 105 104  CO2 23 23 26 24 26 26   GLUCOSE 92 102* 103* 132* 130* 144*  BUN 22* 22* 21* 23* 26* 28*  CREATININE 1.16 1.24 1.23 1.24 1.22 1.13  CALCIUM 7.3* 7.3* 7.5* 7.2* 7.5* 7.5*  MG 1.9  --  2.0 1.8 1.9 1.9  PHOS 2.6 2.4* 2.6 2.5 2.3* 2.4*    Liver Function Tests: Recent Labs  Lab 01/03/18 0516 01/03/18 0959  01/05/18 0530   01/05/18 2018 01/06/18 0037 01/06/18 0419 01/06/18 0423 01/06/18 0758  AST 240* 498*  --  1,984*  --   --   --   --  842*  --   ALT 217* 404*  --  2,352*  --   --   --   --  1,886*  --   ALKPHOS 62 54  --  57  --   --   --   --  58  --   BILITOT 2.5* 4.0*  --  2.2*  --   --   --   --  2.0*  --   PROT 6.7 5.5*  --  4.6*  --   --   --   --  4.8*  --   ALBUMIN 3.6 2.9*   < > 2.4*  2.5*   < > 2.6* 2.3* 2.4* 2.4* 2.5*   < > = values in this interval not displayed.   Recent Labs  Lab 01/03/18 1044  LIPASE 52*  AMYLASE 45   Recent Labs  Lab 01/03/18 1518  AMMONIA 64*    CBC: Recent Labs  Lab 01/01/2018 1152 01/03/18 0516 01/03/18 0959 01/04/18 0415 01/05/18 0530  WBC 8.9 13.2* 14.1* 21.8* 12.6*  NEUTROABS  --   --   --   --  10.9*  HGB 11.7* 12.2*  11.0* 10.0* 9.5*  HCT 34.9* 37.0* 33.4* 30.3* 28.4*  MCV 98.6 101.4* 101.2* 98.6 97.8  PLT 304 365 213 96* 84*    Cardiac Enzymes: Recent Labs  Lab 12/27/2017 1152 01/03/18 0516 01/03/18 0959 01/03/18 1518  CKTOTAL  --  180  --   --   TROPONINI 0.04* 0.04* 0.04* 0.06*    BNP: Invalid input(s): POCBNP  CBG: Recent Labs  Lab 01/05/18 1526 01/05/18 2023 01/06/18 0041 01/06/18 0433 01/06/18 0755  GLUCAP 92 96 130* 116* 130*    Microbiology: Results for orders placed or performed during the hospital encounter of 12/24/2017  Culture, blood (routine x 2)     Status: None (Preliminary result)   Collection Time: 01/03/18 10:42 AM  Result Value Ref Range Status   Specimen Description BLOOD LT [facility]  Final   Special Requests   Final    BOTTLES DRAWN AEROBIC AND ANAEROBIC Blood Culture adequate volume   Culture   Final    NO GROWTH 3 DAYS Performed at [facility], [address] [place], [place] [address]    Report Status PENDING  Incomplete  MRSA PCR Screening     Status: None   Collection Time: 01/03/18 10:56 AM  Result Value Ref Range Status   MRSA by PCR NEGATIVE NEGATIVE Final    Comment:        The  GeneXpert MRSA Assay (FDA approved for NASAL specimens only), is one component of a comprehensive MRSA colonization surveillance program. It is not intended to diagnose MRSA infection nor to guide or monitor treatment for MRSA infections. Performed at [facility], [address] [place], [place] [address]   CULTURE, BLOOD (ROUTINE X 2) w Reflex to ID Panel     Status: None (Preliminary result)   Collection Time: 01/03/18  1:25 PM  Result Value Ref Range Status   Specimen Description BLOOD LINE  Final   Special Requests   Final    BOTTLES DRAWN AEROBIC AND ANAEROBIC Blood Culture adequate volume   Culture   Final    NO GROWTH 3 DAYS Performed at [facility], [address], [place], [place] [address]    Report Status PENDING  Incomplete  Culture, Urine     Status: None   Collection Time: 01/03/18  4:49 PM  Result Value Ref Range Status   Specimen Description   Final    URINE, RANDOM Performed at [facility], [address] [place], [place] [address]    Special Requests   Final    NONE Performed at [facility], [address] [place], [place] [address]    Culture   Final    NO GROWTH Performed at [facility], [place] [address], [place], [place] [address]    Report Status 01/04/2018 FINAL  Final  Culture, expectorated sputum-assessment     Status: None   Collection Time: 01/03/18  5:53 PM  Result Value Ref Range Status   Specimen Description TRACHEAL ASPIRATE  Final   Special Requests NONE  Final   Sputum evaluation   Final    THIS SPECIMEN IS ACCEPTABLE FOR SPUTUM CULTURE Performed at [facility], [address] [place],  [address]    Report Status 01/03/2018 FINAL  Final  Culture, respiratory (NON-Expectorated)     Status: None (Preliminary result)   Collection Time: 01/03/18  5:53 PM  Result Value Ref Range Status   Specimen Description   Final    TRACHEAL ASPIRATE Performed  at [facility], [address] [place], [place] [address]    Special Requests   Final    NONE Reflexed from [id number] Performed at [facility], [address] [place], [place] [address]    Gram Stain   Final    FEW WBC PRESENT, PREDOMINANTLY PMN RARE SQUAMOUS EPITHELIAL CELLS PRESENT FEW GRAM POSITIVE COCCI IN CLUSTERS FEW GRAM NEGATIVE COCCOBACILLI    Culture   Final    CULTURE REINCUBATED FOR BETTER GROWTH Performed at [facility], [place] [address], [place], [place] [address]    Report Status PENDING  Incomplete    Coagulation Studies: Recent Labs    01/04/18 0415 01/05/18 0530  LABPROT 26.1* 25.4*  INR 2.42 2.33    Urinalysis: Recent Labs    01/03/18 1649  COLORURINE AMBER*  LABSPEC 1.044*  PHURINE 5.0  GLUCOSEU 50*  HGBUR MODERATE*  BILIRUBINUR NEGATIVE  KETONESUR NEGATIVE  PROTEINUR 100*  NITRITE NEGATIVE  LEUKOCYTESUR NEGATIVE      Imaging: US Venous Img Lower Bilateral  Result Date: 01/05/2018 CLINICAL DATA:  Bilateral lower extremity swelling EXAM: BILATERAL LOWER EXTREMITY VENOUS DOPPLER ULTRASOUND TECHNIQUE: Gray-scale sonography with graded compression, as well as color Doppler and duplex ultrasound were performed to evaluate the lower extremity deep venous systems from the level of the common femoral vein and including the common femoral, femoral, profunda femoral, popliteal and calf veins including the posterior tibial, peroneal and gastrocnemius veins when visible. The superficial great saphenous vein was also interrogated. Spectral Doppler was utilized to evaluate flow at rest and with distal augmentation maneuvers in the common femoral, femoral and popliteal veins. COMPARISON:  None. FINDINGS: RIGHT LOWER EXTREMITY Common Femoral Vein: Unable to be evaluated due to overlying dressing from recent catheter placement Saphenofemoral Junction: Unable to be evaluated due to overlying dressing from recent catheter placement Profunda Femoral Vein: Unable  to be evaluated due to overlying dressing from recent catheter placement Femoral Vein: No evidence of thrombus. Normal compressibility, respiratory phasicity and response to augmentation. Popliteal Vein: No evidence of thrombus. Normal compressibility, respiratory phasicity and response to augmentation. Calf Veins: No evidence of thrombus. Normal compressibility and flow on color Doppler imaging. Superficial Great Saphenous Vein: No evidence of thrombus. Normal compressibility. Venous Reflux:  None. Other Findings:  None. LEFT LOWER EXTREMITY Common Femoral Vein: No evidence of thrombus. Normal compressibility, respiratory phasicity and response to augmentation. Saphenofemoral Junction: No evidence of thrombus. Normal compressibility and flow on color Doppler imaging. Profunda Femoral Vein: No evidence of thrombus. Normal compressibility and flow on color Doppler imaging. Femoral Vein: No evidence of thrombus. Normal compressibility, respiratory phasicity and response to augmentation. Popliteal Vein: No evidence of thrombus. Normal compressibility, respiratory phasicity and response to augmentation. Calf Veins: No evidence of thrombus. Normal compressibility and flow on color Doppler imaging. Superficial Great Saphenous Vein: No evidence of thrombus. Normal compressibility. Venous Reflux:  None. Other Findings:  None. IMPRESSION: No evidence of deep venous thrombosis. The right femoral vein has a central venous line within and could not be well evaluated. Electronically Signed   By: Inez Catalina M.D.   On: 01/05/2018 15:22     Medications:   . sodium chloride    . cisatracurium (NIMBEX) infusion 0.6 mcg/kg/min (01/06/18 0507)  . famotidine (PEPCID) IV Stopped (01/05/18 1433)  . feeding supplement (VITAL HIGH PROTEIN) 60 mL/hr at 01/06/18 0530  . fentaNYL infusion INTRAVENOUS 350 mcg/hr (01/06/18 0512)  . heparin 1,800 Units/hr (01/06/18 0053)  . midazolam (VERSED) infusion  6 mg/hr (01/06/18 0531)  .  norepinephrine (LEVOPHED) Adult infusion 2 mcg/min (01/06/18 0907)  . piperacillin-tazobactam (ZOSYN)  IV 3.375 g (01/06/18 1950)  . pureflow 3 each (01/06/18 0545)  . vasopressin (PITRESSIN) infusion - *FOR SHOCK* Stopped (01/05/18 2225)   . B-complex with vitamin C  1 tablet Per Tube QHS  . chlorhexidine gluconate (MEDLINE KIT)  15 mL Mouth Rinse BID  . feeding supplement (PRO-STAT SUGAR FREE 64)  60 mL Per Tube BID  . insulin aspart  0-15 Units Subcutaneous Q4H  . ipratropium-albuterol  3 mL Nebulization Q6H  . mouth rinse  15 mL Mouth Rinse 10 times per day  . multivitamin-lutein  1 capsule Per Tube Daily  . polyvinyl alcohol  1 drop Both Eyes TID  . sodium chloride flush  3 mL Intravenous Q12H  . thiamine injection  100 mg Intravenous Daily   sodium chloride, acetaminophen **OR** acetaminophen, heparin, heparin, iopamidol, [DISCONTINUED] ondansetron **OR** ondansetron (ZOFRAN) IV, sodium chloride flush  Assessment/ Plan:  [name] is a 52 y.o. white male with diabetes mellitus type 2, hypertension, chronic systolic heart failure ejection fraction 45%, who was admitted to [facility] on 12/20/2017 for evaluation of abdominal discomfort and bloating.   Patient had decompensation on January 03, 2018 with severe hypotension requiring multiple pressors, acute respiratory failure, shock liver and acute renal failure.  1.  Acute renal failure secondary to severe hypotension, and also exposed to contrast with CTA. Baseline careatinine 1.14, 12/12/16 2.  Hyperkalemia. 3.  Metabolic acidosis with elevated lactic acid level, pt was on metformin and TCAs as outpt.  4.  Acute respiratory failure. 5.  Elevated liver enzymes. 6. Hyponatremia 7. Hypoalbuminemia.   Plan:  Continue CRRT 4K bath. UF remain at 25m/hr.  Oliguric urine output - continue to monitor.  Start IV albumin.    LOS: 3 Antonya Leeder 6/25/201910:01 AM

## 2018-01-06 NOTE — Progress Notes (Signed)
CRRT filter changed due to expiration of filter life at approximately 2300, 01/05/18. Patient's blood rinsed back without incidence. Both lumens of Vascath heparin locked for filter change. CRRT restarted at 0019, 01/06/18.

## 2018-01-06 NOTE — Progress Notes (Signed)
Lucilla Lame, MD [facility]   [address] [place], [place] [address] Phone: [phone] Fax : [phone]   Subjective: The patient is nonverbal and intubated.  The patient's liver enzymes have dramatically decreased overnight consistent with the patient's diagnosis of shock liver.   Objective: Vital signs in last 24 hours: Vitals:   01/06/18 0400 01/06/18 0500 01/06/18 0600 01/06/18 0700  BP: 95/72 93/64 92/66  (!) 87/60  Pulse: (!) 123 (!) 123 (!) 122 (!) 122  Resp: (!) 25 (!) 25 (!) 25 (!) 25  Temp: 97.9 F (36.6 C)     TempSrc: Oral     SpO2: 100% 99% 99% 98%  Weight:      Height:       Weight change: 3 lb 4.9 oz (1.5 kg)  Intake/Output Summary (Last 24 hours) at 01/06/2018 6812 Last data filed at 01/06/2018 0700 Gross per 24 hour  Intake 2624.15 ml  Output 1450 ml  Net 1174.15 ml     Exam: Heart:: Tachycardia Lungs: normal and clear to auscultation and percussion Abdomen: Decreased bowel sounds tympanic without rebound or guarding   Lab Results: @LABTEST2 @ Micro Results: Recent Results (from the past 240 hour(s))  Culture, blood (routine x 2)     Status: None (Preliminary result)   Collection Time: 01/03/18 10:42 AM  Result Value Ref Range Status   Specimen Description BLOOD LT [facility]  Final   Special Requests   Final    BOTTLES DRAWN AEROBIC AND ANAEROBIC Blood Culture adequate volume   Culture   Final    NO GROWTH 3 DAYS Performed at [facility], [address] [place], [place] [address]    Report Status PENDING  Incomplete  MRSA PCR Screening     Status: None   Collection Time: 01/03/18 10:56 AM  Result Value Ref Range Status   MRSA by PCR NEGATIVE NEGATIVE Final    Comment:        The GeneXpert MRSA Assay (FDA approved for NASAL specimens only), is one component of a comprehensive MRSA colonization surveillance program. It is not intended to diagnose MRSA infection nor to guide or monitor treatment for MRSA  infections. Performed at [facility], [address] [place], [place] [address]   CULTURE, BLOOD (ROUTINE X 2) w Reflex to ID Panel     Status: None (Preliminary result)   Collection Time: 01/03/18  1:25 PM  Result Value Ref Range Status   Specimen Description BLOOD LINE  Final   Special Requests   Final    BOTTLES DRAWN AEROBIC AND ANAEROBIC Blood Culture adequate volume   Culture   Final    NO GROWTH 3 DAYS Performed at [facility], [address], [place], [place] [address]    Report Status PENDING  Incomplete  Culture, Urine     Status: None   Collection Time: 01/03/18  4:49 PM  Result Value Ref Range Status   Specimen Description   Final    URINE, RANDOM Performed at [facility], [address] [place], [place] [address]    Special Requests   Final    NONE Performed at [facility], [address] [place], [place] [address]    Culture   Final    NO GROWTH Performed at [facility], [place] [address], [place], [place] [address]    Report Status 01/04/2018 FINAL  Final  Culture, expectorated sputum-assessment     Status: None   Collection Time: 01/03/18  5:53  PM  Result Value Ref Range Status   Specimen Description TRACHEAL ASPIRATE  Final   Special Requests NONE  Final   Sputum evaluation   Final    THIS SPECIMEN IS ACCEPTABLE FOR SPUTUM CULTURE Performed at [facility], [address] [place], [place] [address]    Report Status 01/03/2018 FINAL  Final  Culture, respiratory (NON-Expectorated)     Status: None (Preliminary result)   Collection Time: 01/03/18  5:53 PM  Result Value Ref Range Status   Specimen Description   Final    TRACHEAL ASPIRATE Performed at [facility], [address] [place], [place] [address]    Special Requests   Final    NONE Reflexed from [id number] Performed at [facility], [address] [place], [place] [address]    Gram Stain   Final    FEW WBC PRESENT,  PREDOMINANTLY PMN RARE SQUAMOUS EPITHELIAL CELLS PRESENT FEW GRAM POSITIVE COCCI IN CLUSTERS FEW GRAM NEGATIVE COCCOBACILLI    Culture   Final    CULTURE REINCUBATED FOR BETTER GROWTH Performed at [facility], [place] [address] [place],  [address]    Report Status PENDING  Incomplete   Studies/Results: US Venous Img Lower Bilateral  Result Date: 01/05/2018 CLINICAL DATA:  Bilateral lower extremity swelling EXAM: BILATERAL LOWER EXTREMITY VENOUS DOPPLER ULTRASOUND TECHNIQUE: Gray-scale sonography with graded compression, as well as color Doppler and duplex ultrasound were performed to evaluate the lower extremity deep venous systems from the level of the common femoral vein and including the common femoral, femoral, profunda femoral, popliteal and calf veins including the posterior tibial, peroneal and gastrocnemius veins when visible. The superficial great saphenous vein was also interrogated. Spectral Doppler was utilized to evaluate flow at rest and with distal augmentation maneuvers in the common femoral, femoral and popliteal veins. COMPARISON:  None. FINDINGS: RIGHT LOWER EXTREMITY Common Femoral Vein: Unable to be evaluated due to overlying dressing from recent catheter placement Saphenofemoral Junction: Unable to be evaluated due to overlying dressing from recent catheter placement Profunda Femoral Vein: Unable to be evaluated due to overlying dressing from recent catheter placement Femoral Vein: No evidence of thrombus. Normal compressibility, respiratory phasicity and response to augmentation. Popliteal Vein: No evidence of thrombus. Normal compressibility, respiratory phasicity and response to augmentation. Calf Veins: No evidence of thrombus. Normal compressibility and flow on color Doppler imaging. Superficial Great Saphenous Vein: No evidence of thrombus. Normal compressibility. Venous Reflux:  None. Other Findings:  None. LEFT LOWER EXTREMITY Common Femoral Vein: No evidence  of thrombus. Normal compressibility, respiratory phasicity and response to augmentation. Saphenofemoral Junction: No evidence of thrombus. Normal compressibility and flow on color Doppler imaging. Profunda Femoral Vein: No evidence of thrombus. Normal compressibility and flow on color Doppler imaging. Femoral Vein: No evidence of thrombus. Normal compressibility, respiratory phasicity and response to augmentation. Popliteal Vein: No evidence of thrombus. Normal compressibility, respiratory phasicity and response to augmentation. Calf Veins: No evidence of thrombus. Normal compressibility and flow on color Doppler imaging. Superficial Great Saphenous Vein: No evidence of thrombus. Normal compressibility. Venous Reflux:  None. Other Findings:  None. IMPRESSION: No evidence of deep venous thrombosis. The right femoral vein has a central venous line within and could not be well evaluated. Electronically Signed   By: Inez Catalina M.D.   On: 01/05/2018 15:22   Medications: I have reviewed the patient's current medications. Scheduled Meds: . B-complex with vitamin C  1 tablet Per Tube QHS  . chlorhexidine gluconate (MEDLINE KIT)  15 mL Mouth Rinse BID  . feeding supplement (PRO-STAT SUGAR FREE 64)  60 mL Per Tube BID  . insulin aspart  0-15 Units Subcutaneous Q4H  . ipratropium-albuterol  3 mL Nebulization Q6H  . mouth rinse  15 mL Mouth Rinse 10 times per day  . multivitamin-lutein  1 capsule Per Tube Daily  . polyvinyl alcohol  1 drop Both Eyes TID  . sodium chloride flush  3 mL Intravenous Q12H  . thiamine injection  100 mg Intravenous Daily   Continuous Infusions: . sodium chloride    . cisatracurium (NIMBEX) infusion 0.6 mcg/kg/min (01/06/18 0507)  . famotidine (PEPCID) IV Stopped (01/05/18 1433)  . feeding supplement (VITAL HIGH PROTEIN) 60 mL/hr at 01/06/18 0530  . fentaNYL infusion INTRAVENOUS 350 mcg/hr (01/06/18 0512)  . heparin 1,800 Units/hr (01/06/18 0053)  . midazolam (VERSED) infusion 6  mg/hr (01/06/18 0531)  . norepinephrine (LEVOPHED) Adult infusion Stopped (01/05/18 1940)  . piperacillin-tazobactam (ZOSYN)  IV 3.375 g (01/06/18 7711)  . pureflow 3 each (01/06/18 0545)  . vasopressin (PITRESSIN) infusion - *FOR SHOCK* Stopped (01/05/18 2225)   PRN Meds:.sodium chloride, acetaminophen **OR** acetaminophen, heparin, heparin, iopamidol, [DISCONTINUED] ondansetron **OR** ondansetron (ZOFRAN) IV, sodium chloride flush   Assessment: Active Problems:   Tachyarrhythmia   Respiratory failure (HCC)   Shock (HCC)   Acute respiratory failure (HCC)   Transaminitis    Plan: This patient has shock liver with multiorgan compromise due to sepsis.  The patient's liver enzymes are improving.  Nothing further to do from a GI point of view.  I will sign off.  Please call if any further GI concerns or questions.  We would like to thank you for the opportunity to participate in the care of ODARIUS DINES.     LOS: 3 days   Lucilla Lame 01/06/2018, 7:42 AM

## 2018-01-06 NOTE — Progress Notes (Signed)
PULMONARY / CRITICAL CARE MEDICINE   Name: Ryan Webb MRN: [id number] DOB: 02/02/1966    ADMISSION DATE:  08-18-2017  HISTORY OF PRESENT ILLNESS:   This is a52 y.o.malewith a known history of habitus mellitus type II, hypertension presented to the emergency room with abdominal discomfort and bloating.This started couple of days ago.Patient also has difficulty taking a deep breath because of the abdominal bloating.Last bowel movement was couple of days ago.Patient was found to be tachycardic when he presented to the emergency room.He has been evaluated for tachycardia by Dr. Lady GaryFath from cardiology and has been prescribed initially metoprolol patient could not tolerate the metoprolol and he was started on verapamil.Patient continues to be tachycardic even in the emergency room with a heart rate of 130 bpm.He was worked up with CT abdomen which showed no obstruction or any acute pathology.No complaints of any chest pain per se.Hospitalist service was consulted for further care.   REVIEW OF SYSTEMS:   On vent support; unattainable  SUBJECTIVE:  Comfortable on vent  VITAL SIGNS: BP 93/64   Pulse (!) 123   Temp 97.9 F (36.6 C) (Oral)   Resp (!) 25   Ht 5\' 9"  (1.753 m)   Wt (!) 314 lb 13.1 oz (142.8 kg)   SpO2 99%   BMI 46.49 kg/m   HEMODYNAMICS:  on Vasopressin and Levophed for BP support  VENTILATOR SETTINGS: Vent Mode: PRVC FiO2 (%):  [0.4 %-50 %] 40 % Set Rate:  [22 bmp-25 bmp] 25 bmp Vt Set:  [500 mL] 500 mL PEEP:  [5 cmH20] 5 cmH20 Delta P (Amplitude):  [23] 23 Plateau Pressure:  [25 cmH20] 25 cmH20  INTAKE / OUTPUT: I/O last 3 completed shifts: In: 5443.9 [I.V.:4961.1; NG/GT:250.3; IV Piggyback:232.5] Out: 2782 [Urine:965; Emesis/NG output:300; Other:1517]  PHYSICAL EXAMINATION: GENERAL:Obese, on mechanical ventilation EYES: Pupils equal, round, minimally reactive to light. No scleral icterus.  Decreased sclera oedema HEENT: Head atraumatic,  normocephalic. ET tube in place. NECK: Supple, no jugular venous distention. No thyroid enlargement, no tenderness.  LUNGS: Bilateral rhonchi CARDIOVASCULAR: S1, S2 tachycardia, IR IR. No murmurs, rubs, or gallops.  ABDOMEN: Soft, obese, inaudible BS. No organomegaly or mass.  EXTREMITIES: right calf > left, doppler pulses NEUROLOGIC: sedated, Nurse reported purposeful movements last evening PSYCHIATRIC: on vent SKIN: warm and dry   LABS:  BMET Recent Labs  Lab 01/05/18 1719 01/05/18 2018 01/06/18 0037  NA 133* 135 134*  K 3.7 4.1 3.7  CL 102 103 103  CO2 23 26 24   BUN 22* 21* 23*  CREATININE 1.24 1.23 1.24  GLUCOSE 102* 103* 132*    Electrolytes Recent Labs  Lab 01/05/18 1550 01/05/18 1719 01/05/18 2018 01/06/18 0037  CALCIUM 7.3* 7.3* 7.5* 7.2*  MG 1.9  --  2.0 1.8  PHOS 2.6 2.4* 2.6 2.5    CBC Recent Labs  Lab 01/03/18 0959 01/04/18 0415 01/05/18 0530  WBC 14.1* 21.8* 12.6*  HGB 11.0* 10.0* 9.5*  HCT 33.4* 30.3* 28.4*  PLT 213 96* 84*    Coag's Recent Labs  Lab 01/03/18 0516 01/03/18 0959 01/04/18 0415 01/05/18 0530  APTT 34 42*  --   --   INR 1.30 1.95 2.42 2.33    Sepsis Markers Recent Labs  Lab 01/03/18 0516  01/04/18 0415  01/05/18 0530  01/05/18 1658 01/05/18 2007 01/06/18 0037  LATICACIDVEN 4.2*   < >  --    < >  --    < > 1.6 1.9 1.4  PROCALCITON 0.10  --  4.75  --  4.37  --   --   --   --    < > = values in this interval not displayed.    ABG Recent Labs  Lab 01/05/18 0927 01/05/18 1224 01/05/18 2059  PHART 7.25* 7.32* 7.37  PCO2ART 72* 57* 48  PO2ART 77* 99 112*    Liver Enzymes Recent Labs  Lab 01/03/18 0516 01/03/18 0959  01/05/18 0530  01/05/18 1719 01/05/18 2018 01/06/18 0037  AST 240* 498*  --  1,984*  --   --   --   --   ALT 217* 404*  --  2,352*  --   --   --   --   ALKPHOS 62 54  --  57  --   --   --   --   BILITOT 2.5* 4.0*  --  2.2*  --   --   --   --   ALBUMIN 3.6 2.9*   < > 2.4*  2.5*   < >  2.6* 2.6* 2.3*   < > = values in this interval not displayed.    Cardiac Enzymes Recent Labs  Lab 01/03/18 0516 01/03/18 0959 01/03/18 1518  TROPONINI 0.04* 0.04* 0.06*    Glucose Recent Labs  Lab 01/05/18 1147 01/05/18 1305 01/05/18 1526 01/05/18 2023 01/06/18 0041 01/06/18 0433  GLUCAP 86 138* 92 96 130* 116*    Imaging US Venous Img Lower Bilateral  Result Date: 01/05/2018 CLINICAL DATA:  Bilateral lower extremity swelling EXAM: BILATERAL LOWER EXTREMITY VENOUS DOPPLER ULTRASOUND TECHNIQUE: Gray-scale sonography with graded compression, as well as color Doppler and duplex ultrasound were performed to evaluate the lower extremity deep venous systems from the level of the common femoral vein and including the common femoral, femoral, profunda femoral, popliteal and calf veins including the posterior tibial, peroneal and gastrocnemius veins when visible. The superficial great saphenous vein was also interrogated. Spectral Doppler was utilized to evaluate flow at rest and with distal augmentation maneuvers in the common femoral, femoral and popliteal veins. COMPARISON:  None. FINDINGS: RIGHT LOWER EXTREMITY Common Femoral Vein: Unable to be evaluated due to overlying dressing from recent catheter placement Saphenofemoral Junction: Unable to be evaluated due to overlying dressing from recent catheter placement Profunda Femoral Vein: Unable to be evaluated due to overlying dressing from recent catheter placement Femoral Vein: No evidence of thrombus. Normal compressibility, respiratory phasicity and response to augmentation. Popliteal Vein: No evidence of thrombus. Normal compressibility, respiratory phasicity and response to augmentation. Calf Veins: No evidence of thrombus. Normal compressibility and flow on color Doppler imaging. Superficial Great Saphenous Vein: No evidence of thrombus. Normal compressibility. Venous Reflux:  None. Other Findings:  None. LEFT LOWER EXTREMITY Common  Femoral Vein: No evidence of thrombus. Normal compressibility, respiratory phasicity and response to augmentation. Saphenofemoral Junction: No evidence of thrombus. Normal compressibility and flow on color Doppler imaging. Profunda Femoral Vein: No evidence of thrombus. Normal compressibility and flow on color Doppler imaging. Femoral Vein: No evidence of thrombus. Normal compressibility, respiratory phasicity and response to augmentation. Popliteal Vein: No evidence of thrombus. Normal compressibility, respiratory phasicity and response to augmentation. Calf Veins: No evidence of thrombus. Normal compressibility and flow on color Doppler imaging. Superficial Great Saphenous Vein: No evidence of thrombus. Normal compressibility. Venous Reflux:  None. Other Findings:  None. IMPRESSION: No evidence of deep venous thrombosis. The right femoral vein has a central venous line within and could not be well evaluated. Electronically Signed   By: Eulah Pont.D.  On: 01/05/2018 15:22     STUDIES:  6/22 ECHO  CULTURES: MRSA PCR- Neg  ANTIBIOTICS: 6/22Zosyn; Vancomycin 6/24 Vancomycin D/C  SIGNIFICANT EVENTS: 6/21 admission to hospital 6/22 transfer to ICU,    LINES/TUBES: 6/22  Foley 6/22 R-IJ TLC, R- Femoral hemodialysis cath 6/22 L- Femoral Arterial line 6/22 Endotracheal tube   DISCUSSION: This 53 year old gentleman admitted with severe sepsis cultures negative, scans negative found to have acute renal failure with metformin toxicity and dye nephropathy.    ASSESSMENT / PLAN: 1. Severe sepsis with profound shock. CT scans have not identified a source of infection, all cultures remain negative Plan: will Continue Zosyn.  2. Severe Lactic acidosis R/O Metformin toxicity Lactic acidosis has resolved. Plan: Continue to trend daily until off CRRT  3. Acute Renal Failure with severe acidosis- multifactorial Dye Nephropathy, Metformin insult, sepsis Plan: CRRT, electrolytes replacement  per Nephrology team  4. Acute hypoxic Respiratory failure on ventilatory support Plan: lung protective ventilation strategy, not ready for wean  5. Acute Ischemic Hepatitis.  Bilirubin has trended downward Plan: follow up Hepatitis  results  6. Atrial fibrillation/ flutter- rate stable Plan: heparin drip  7. DIC. Will consider switching Heparin to Argatroban if Plts continues to decrease particularly if CRRT continues Plan: monitor Platets levels.  Disp: Dietitian consult  FAMILY  - Updates: Wife updated  Thank you for allowing me the privileges to care for this patient.  I have dedicated a total of 40 minutes in critical care time minus all appropriate exclusions.   Jackson Latino, MD Pulmonary and Critical Care Medicine [facility] Pager: [phone]  01/06/2018, 5:41 AM

## 2018-01-06 NOTE — Progress Notes (Signed)
[facility] [place] [facility]   PATIENT NAME: Ryan Webb    MR#:  [id number]  DATE OF BIRTH:  02/13/1966  SUBJECTIVE:   Intubated and sedated on vent  Receiving CRRT  REVIEW OF SYSTEMS:    unAble to obtain  tolerating Diet: Tube feeds        DRUG ALLERGIES:  No Known Allergies  VITALS:  Blood pressure 91/70, pulse (!) 124, temperature 97.9 F (36.6 C), temperature source Oral, resp. rate (!) 25, height 5\' 9"  (1.753 m), weight (!) 142.8 kg (314 lb 13.1 oz), SpO2 98 %.  PHYSICAL EXAMINATION:  Constitutional: Appears obese sedated on vent HENT: Normocephalic. . Intubated Eyes: , no scleral icterus.  Neck: Normal ROM. Neck supple. No JVD. No tracheal deviation. CVS: RRR, S1/S2 +, no murmurs, no gallops, no carotid bruit.  Pulmonary: Effort and breath sounds normal, no stridor, rhonchi, wheezes, rales.  Abdominal: Soft. BS +,  no distension, tenderness, rebound or guarding.  Musculoskeletal: sedated Neuro:sedated     . Skin: Skin is warm and dry. No rash noted. Psychiatric: sedated    LABORATORY PANEL:   CBC Recent Labs  Lab 01/05/18 0530  WBC 12.6*  HGB 9.5*  HCT 28.4*  PLT 84*   ------------------------------------------------------------------------------------------------------------------  Chemistries  Recent Labs  Lab 01/06/18 0423 01/06/18 0758  NA  --  134*  K  --  3.8  CL  --  104  CO2  --  26  GLUCOSE  --  144*  BUN  --  28*  CREATININE  --  1.13  CALCIUM  --  7.5*  MG  --  1.9  AST 842*  --   ALT 1,886*  --   ALKPHOS 58  --   BILITOT 2.0*  --    ------------------------------------------------------------------------------------------------------------------  Cardiac Enzymes Recent Labs  Lab 01/03/18 0516 01/03/18 0959 01/03/18 1518  TROPONINI 0.04* 0.04* 0.06*   ------------------------------------------------------------------------------------------------------------------  RADIOLOGY:  [place] Venous  Img Lower Bilateral  Result Date: 01/05/2018 CLINICAL DATA:  Bilateral lower extremity swelling EXAM: BILATERAL LOWER EXTREMITY VENOUS DOPPLER ULTRASOUND TECHNIQUE: Gray-scale sonography with graded compression, as well as color Doppler and duplex ultrasound were performed to evaluate the lower extremity deep venous systems from the level of the common femoral vein and including the common femoral, femoral, profunda femoral, popliteal and calf veins including the posterior tibial, peroneal and gastrocnemius veins when visible. The superficial great saphenous vein was also interrogated. Spectral Doppler was utilized to evaluate flow at rest and with distal augmentation maneuvers in the common femoral, femoral and popliteal veins. COMPARISON:  None. FINDINGS: RIGHT LOWER EXTREMITY Common Femoral Vein: Unable to be evaluated due to overlying dressing from recent catheter placement Saphenofemoral Junction: Unable to be evaluated due to overlying dressing from recent catheter placement Profunda Femoral Vein: Unable to be evaluated due to overlying dressing from recent catheter placement Femoral Vein: No evidence of thrombus. Normal compressibility, respiratory phasicity and response to augmentation. Popliteal Vein: No evidence of thrombus. Normal compressibility, respiratory phasicity and response to augmentation. Calf Veins: No evidence of thrombus. Normal compressibility and flow on color Doppler imaging. Superficial Great Saphenous Vein: No evidence of thrombus. Normal compressibility. Venous Reflux:  None. Other Findings:  None. LEFT LOWER EXTREMITY Common Femoral Vein: No evidence of thrombus. Normal compressibility, respiratory phasicity and response to augmentation. Saphenofemoral Junction: No evidence of thrombus. Normal compressibility and flow on color Doppler imaging. Profunda Femoral Vein: No evidence of thrombus. Normal compressibility and flow on color Doppler imaging. Femoral  Vein: No evidence of  thrombus. Normal compressibility, respiratory phasicity and response to augmentation. Popliteal Vein: No evidence of thrombus. Normal compressibility, respiratory phasicity and response to augmentation. Calf Veins: No evidence of thrombus. Normal compressibility and flow on color Doppler imaging. Superficial Great Saphenous Vein: No evidence of thrombus. Normal compressibility. Venous Reflux:  None. Other Findings:  None. IMPRESSION: No evidence of deep venous thrombosis. The right femoral vein has a central venous line within and could not be well evaluated. Electronically Signed   By: Alcide Clever M.D.   On: 01/05/2018 15:22     ASSESSMENT AND PLAN:   52 year old male with obesity, diabetes, hypertension came in with tachyarrhythmia and was noted to be in septic shock  1.  Septic shock after presentation multiorgan failure. Continue to wean pressors.  Continue vent management as per ICU Continue broad-spectrum antibiotics   2.  Ectopic atrial tachycardia versus atrial flutter-remains tachycardic Only on heparin drip at this time. CT Angio of the chest and pelvis negative for any PE or aortic dissection. Appreciate cardiology consult Echocardiogram with LV ejection fraction of 45% which is stable from prior.  3.  Acute renal failure due toATN secondary to septic shock and contrast exposure due to CT scans. Remains on CRRT at this time. The albumin Management per nephrology  4.  Thrombocytopenia: This is from sepsis and acute illness.   Needs CBC for tomorrow.  5.  Acute respiratory failure: Due to sepsis and multiorgan failure Remains on ventilator and management per ICU team   6.  Elevated LFTs due to shock liver with multiorgan failure due to sepsis.  LFTs are improving GI consultation appreciated      Management plans discussed with nephrology. CODE STATUS: FULL  TOTAL TIME TAKING CARE OF THIS PATIENT: 21 minutes.     POSSIBLE D/C ??, DEPENDING ON CLINICAL  CONDITION.   Trebor Galdamez M.D on 01/06/2018 at 12:02 PM  Between 7am to 6pm - Pager - [phone] After 6pm go to [email or web address] - password [email or web address]  [facility]  Office  [phone]  CC: Primary care physician; Anola Gurney, PA  Note: This dictation was prepared with Dragon dictation along with smaller phrase technology. Any transcriptional errors that result from this process are unintentional.

## 2018-01-06 NOTE — Progress Notes (Signed)
ANTICOAGULATION CONSULT NOTE - Follow Up Consult  Pharmacy Consult for Heparin Drip Indication: atrial fibrillation  No Known Allergies  Patient Measurements: Height: 5\' 9"  (175.3 cm) Weight: (!) 314 lb 13.1 oz (142.8 kg) IBW/kg (Calculated) : 70.7 Heparin Dosing Weight: 98.9 kg  Vital Signs: Temp: 97.9 F (36.6 C) (06/25 0400) Temp Source: Oral (06/25 0400) BP: 87/60 (06/25 0700) Pulse Rate: 122 (06/25 0700)  Labs: Recent Labs    01/03/18 0959  01/03/18 1518  01/04/18 0415  01/04/18 1628  01/05/18 0530  01/05/18 2018 01/06/18 0037 01/06/18 0419 01/06/18 0423  HGB 11.0*  --   --   --  10.0*  --   --   --  9.5*  --   --   --   --   --   HCT 33.4*  --   --   --  30.3*  --   --   --  28.4*  --   --   --   --   --   PLT 213  --   --   --  96*  --   --   --  84*  --   --   --   --   --   APTT 42*  --   --   --   --   --   --   --   --   --   --   --   --   --   LABPROT 22.1*  --   --   --  26.1*  --   --   --  25.4*  --   --   --   --   --   INR 1.95  --   --   --  2.42  --   --   --  2.33  --   --   --   --   --   HEPARINUNFRC  --   --  <0.10*   < > 0.32  --  0.52  --  0.52  --   --   --   --  0.52  CREATININE 1.95*   < >  --    < > 1.80*   < > 1.67*  1.72*   < > 1.34*   < > 1.23 1.24 1.22  --   TROPONINI 0.04*  --  0.06*  --   --   --   --   --   --   --   --   --   --   --    < > = values in this interval not displayed.    Estimated Creatinine Clearance: 100.8 mL/min (by C-G formula based on SCr of 1.22 mg/dL).   Assessment: Patient is 52yo male admitted with tachycardia. He was initiated on Heparin drip this morning for possible PE. Second consult entered for Heparin for afib.  Heparin was running at 1600 units/hr, order was discontinued at ~13:00 and then 2nd consult entered.  6/23 Heparin currently running at 1800 units/hr. Noted that patient now with thrombocytopenia (possibly due to sepsis, could be DIC) MD aware and watching closely.  Goal of Therapy:   Heparin level 0.3-0.7 units/ml Monitor platelets by anticoagulation protocol: Yes   Plan:  06/24 @ 0500 HL 0.52 therapeutic. Will continue rate at 1800 units/hr and will recheck HL w/ am labs.  06/25 @ 0423 = 0.52 .  Will continue this pt on current rate and recheck HL on  6/26 with AM labs.   Aryonna Gunnerson D Clinical Pharmacist 01/06/2018

## 2018-01-07 ENCOUNTER — Inpatient Hospital Stay: Payer: BLUE CROSS/BLUE SHIELD

## 2018-01-07 ENCOUNTER — Other Ambulatory Visit: Payer: Self-pay | Admitting: Family Medicine

## 2018-01-07 DIAGNOSIS — R000 Tachycardia, unspecified: Secondary | ICD-10-CM

## 2018-01-07 LAB — MAGNESIUM
MAGNESIUM: 1.9 mg/dL (ref 1.7–2.4)
MAGNESIUM: 1.9 mg/dL (ref 1.7–2.4)
MAGNESIUM: 2 mg/dL (ref 1.7–2.4)
MAGNESIUM: 2 mg/dL (ref 1.7–2.4)
Magnesium: 2.1 mg/dL (ref 1.7–2.4)
Magnesium: 2.1 mg/dL (ref 1.7–2.4)

## 2018-01-07 LAB — GLUCOSE, CAPILLARY
GLUCOSE-CAPILLARY: 119 mg/dL — AB (ref 70–99)
GLUCOSE-CAPILLARY: 137 mg/dL — AB (ref 70–99)
Glucose-Capillary: 126 mg/dL — ABNORMAL HIGH (ref 70–99)
Glucose-Capillary: 116 mg/dL — ABNORMAL HIGH (ref 70–99)
Glucose-Capillary: 112 mg/dL — ABNORMAL HIGH (ref 70–99)

## 2018-01-07 LAB — CBC WITH DIFFERENTIAL/PLATELET
BASOS ABS: 0 10*3/uL (ref 0–0.1)
BLASTS: 0 %
Band Neutrophils: 2 %
Basophils Relative: 0 %
EOS ABS: 0.1 10*3/uL (ref 0–0.7)
Eosinophils Relative: 1 %
HCT: 29 % — ABNORMAL LOW (ref 40.0–52.0)
Hemoglobin: 9.4 g/dL — ABNORMAL LOW (ref 13.0–18.0)
LYMPHS ABS: 0.9 10*3/uL — AB (ref 1.0–3.6)
Lymphocytes Relative: 9 %
MCH: 33.1 pg (ref 26.0–34.0)
MCHC: 32.4 g/dL (ref 32.0–36.0)
MCV: 102 fL — ABNORMAL HIGH (ref 80.0–100.0)
METAMYELOCYTES PCT: 4 %
MONO ABS: 0.7 10*3/uL (ref 0.2–1.0)
MONOS PCT: 7 %
MYELOCYTES: 2 %
NEUTROS ABS: 8.2 10*3/uL — AB (ref 1.4–6.5)
Neutrophils Relative %: 75 %
OTHER: 0 %
PLATELETS: 41 10*3/uL — AB (ref 150–440)
Promyelocytes Relative: 0 %
RBC: 2.85 MIL/uL — ABNORMAL LOW (ref 4.40–5.90)
RDW: 17.3 % — AB (ref 11.5–14.5)
WBC: 9.9 10*3/uL (ref 3.8–10.6)
nRBC: 1 /100 WBC — ABNORMAL HIGH

## 2018-01-07 LAB — RENAL FUNCTION PANEL
ALBUMIN: 3.2 g/dL — AB (ref 3.5–5.0)
ALBUMIN: 3.8 g/dL (ref 3.5–5.0)
ALBUMIN: 3.3 g/dL — AB (ref 3.5–5.0)
ANION GAP: 6 (ref 5–15)
Albumin: 3 g/dL — ABNORMAL LOW (ref 3.5–5.0)
Albumin: 3.1 g/dL — ABNORMAL LOW (ref 3.5–5.0)
Albumin: 3.3 g/dL — ABNORMAL LOW (ref 3.5–5.0)
Anion gap: 6 (ref 5–15)
Anion gap: 6 (ref 5–15)
Anion gap: 7 (ref 5–15)
Anion gap: 7 (ref 5–15)
Anion gap: 7 (ref 5–15)
BUN: 32 mg/dL — ABNORMAL HIGH (ref 6–20)
BUN: 33 mg/dL — AB (ref 6–20)
BUN: 34 mg/dL — ABNORMAL HIGH (ref 6–20)
BUN: 35 mg/dL — AB (ref 6–20)
BUN: 35 mg/dL — AB (ref 6–20)
BUN: 37 mg/dL — ABNORMAL HIGH (ref 6–20)
CALCIUM: 7.8 mg/dL — AB (ref 8.9–10.3)
CALCIUM: 7.9 mg/dL — AB (ref 8.9–10.3)
CHLORIDE: 104 mmol/L (ref 98–111)
CHLORIDE: 104 mmol/L (ref 98–111)
CO2: 22 mmol/L (ref 22–32)
CO2: 24 mmol/L (ref 22–32)
CO2: 25 mmol/L (ref 22–32)
CO2: 25 mmol/L (ref 22–32)
CO2: 25 mmol/L (ref 22–32)
CO2: 25 mmol/L (ref 22–32)
CREATININE: 1.13 mg/dL (ref 0.61–1.24)
CREATININE: 1.16 mg/dL (ref 0.61–1.24)
Calcium: 7.8 mg/dL — ABNORMAL LOW (ref 8.9–10.3)
Calcium: 8 mg/dL — ABNORMAL LOW (ref 8.9–10.3)
Calcium: 8.1 mg/dL — ABNORMAL LOW (ref 8.9–10.3)
Calcium: 8.2 mg/dL — ABNORMAL LOW (ref 8.9–10.3)
Chloride: 103 mmol/L (ref 98–111)
Chloride: 104 mmol/L (ref 98–111)
Chloride: 104 mmol/L (ref 98–111)
Chloride: 106 mmol/L (ref 98–111)
Creatinine, Ser: 1.12 mg/dL (ref 0.61–1.24)
Creatinine, Ser: 1.26 mg/dL — ABNORMAL HIGH (ref 0.61–1.24)
Creatinine, Ser: 1.25 mg/dL — ABNORMAL HIGH (ref 0.61–1.24)
Creatinine, Ser: 1.2 mg/dL (ref 0.61–1.24)
GFR calc Af Amer: >60 mL/min (ref >60)
GFR calc Af Amer: >60 mL/min (ref >60)
GFR calc Af Amer: >60 mL/min (ref >60)
GFR calc non Af Amer: >60 mL/min (ref >60)
GFR calc non Af Amer: >60 mL/min (ref >60)
GFR calc non Af Amer: >60 mL/min (ref >60)
GLUCOSE: 136 mg/dL — AB (ref 70–99)
GLUCOSE: 149 mg/dL — AB (ref 70–99)
Glucose, Bld: 131 mg/dL — ABNORMAL HIGH (ref 70–99)
Glucose, Bld: 134 mg/dL — ABNORMAL HIGH (ref 70–99)
Glucose, Bld: 138 mg/dL — ABNORMAL HIGH (ref 70–99)
Glucose, Bld: 149 mg/dL — ABNORMAL HIGH (ref 70–99)
PHOSPHORUS: 2.7 mg/dL (ref 2.5–4.6)
PHOSPHORUS: 2.9 mg/dL (ref 2.5–4.6)
PHOSPHORUS: 3.2 mg/dL (ref 2.5–4.6)
POTASSIUM: 4.3 mmol/L (ref 3.5–5.1)
Phosphorus: 2.7 mg/dL (ref 2.5–4.6)
Phosphorus: 2.9 mg/dL (ref 2.5–4.6)
Phosphorus: 3 mg/dL (ref 2.5–4.6)
Potassium: 3.9 mmol/L (ref 3.5–5.1)
Potassium: 4 mmol/L (ref 3.5–5.1)
Potassium: 4.1 mmol/L (ref 3.5–5.1)
Potassium: 4.2 mmol/L (ref 3.5–5.1)
Potassium: 4.2 mmol/L (ref 3.5–5.1)
SODIUM: 134 mmol/L — AB (ref 135–145)
SODIUM: 135 mmol/L (ref 135–145)
SODIUM: 135 mmol/L (ref 135–145)
Sodium: 135 mmol/L (ref 135–145)
Sodium: 135 mmol/L (ref 135–145)
Sodium: 136 mmol/L (ref 135–145)

## 2018-01-07 LAB — LACTIC ACID, PLASMA
LACTIC ACID, VENOUS: 1.4 mmol/L (ref 0.5–1.9)
LACTIC ACID, VENOUS: 1.6 mmol/L (ref 0.5–1.9)
LACTIC ACID, VENOUS: 1.7 mmol/L (ref 0.5–1.9)

## 2018-01-07 LAB — APTT
APTT: 67 s — AB (ref 24–36)
aPTT: 89 seconds — ABNORMAL HIGH (ref 24–36)

## 2018-01-07 LAB — BLOOD GAS, ARTERIAL
ACID-BASE DEFICIT: 0.1 mmol/L (ref 0.0–2.0)
Acid-base deficit: 0.6 mmol/L (ref 0.0–2.0)
Bicarbonate: 26.7 mmol/L (ref 20.0–28.0)
Bicarbonate: 25.4 mmol/L (ref 20.0–28.0)
FIO2: 0.4
FIO2: 0.4
MECHVT: 500 mL
Mechanical Rate: 25
O2 Saturation: 98.6 %
O2 Saturation: 99 %
PATIENT TEMPERATURE: 37
PATIENT TEMPERATURE: 37
PCO2 ART: 53 mmHg — AB (ref 32.0–48.0)
PEEP: 5 cmH2O
PH ART: 7.31 — AB (ref 7.350–7.450)
PO2 ART: 141 mmHg — AB (ref 83.0–108.0)
RATE: 25 resp/min
pCO2 arterial: 47 mmHg (ref 32.0–48.0)
pH, Arterial: 7.34 — ABNORMAL LOW (ref 7.350–7.450)
pO2, Arterial: 128 mmHg — ABNORMAL HIGH (ref 83.0–108.0)

## 2018-01-07 LAB — PTT FACTOR INHIBITOR (MIXING STUDY)
aPTT 1:1 NP Incub. Mix Ctl: 36.9 s — ABNORMAL HIGH (ref 22.9–30.2)
aPTT 1:1 NP Mix, 60 Min,Incub.: 35.9 s — ABNORMAL HIGH (ref 22.9–30.2)
aPTT 1:1 Normal Plasma: 29.8 s (ref 22.9–30.2)
aPTT: 36.5 s — ABNORMAL HIGH (ref 22.9–30.2)

## 2018-01-07 LAB — BASIC METABOLIC PANEL
ANION GAP: 7 (ref 5–15)
BUN: 31 mg/dL — ABNORMAL HIGH (ref 6–20)
CALCIUM: 7.9 mg/dL — AB (ref 8.9–10.3)
CO2: 24 mmol/L (ref 22–32)
Chloride: 103 mmol/L (ref 98–111)
Creatinine, Ser: 1.22 mg/dL (ref 0.61–1.24)
Glucose, Bld: 135 mg/dL — ABNORMAL HIGH (ref 70–99)
Potassium: 4 mmol/L (ref 3.5–5.1)
SODIUM: 134 mmol/L — AB (ref 135–145)

## 2018-01-07 LAB — CALCIUM, IONIZED
CALCIUM, IONIZED, SERUM: 4.3 mg/dL — AB (ref 4.5–5.6)
Calcium, Ionized, Serum: 4.1 mg/dL — ABNORMAL LOW (ref 4.5–5.6)
Calcium, Ionized, Serum: 4.2 mg/dL — ABNORMAL LOW (ref 4.5–5.6)
Calcium, Ionized, Serum: 4.3 mg/dL — ABNORMAL LOW (ref 4.5–5.6)

## 2018-01-07 LAB — PROCALCITONIN: Procalcitonin: 3.21 ng/mL

## 2018-01-07 LAB — HEPARIN LEVEL (UNFRACTIONATED): HEPARIN UNFRACTIONATED: 0.4 [IU]/mL (ref 0.30–0.70)

## 2018-01-07 MED ORDER — ARGATROBAN 50 MG/50ML IV SOLN
0.5000 ug/kg/min | INTRAVENOUS | Status: DC
Start: 2018-01-07 — End: 2018-01-09
  Administered 2018-01-07 – 2018-01-09 (×4): 0.5 ug/kg/min via INTRAVENOUS
  Filled 2018-01-07 (×5): qty 50

## 2018-01-07 MED ORDER — SODIUM CHLORIDE 0.9% FLUSH
3.0000 mL | Freq: Two times a day (BID) | INTRAVENOUS | Status: DC
  Administered 2018-01-07 – 2018-01-21 (×26): 3 mL via INTRAVENOUS

## 2018-01-07 MED ORDER — STERILE WATER FOR INJECTION IJ SOLN
INTRAMUSCULAR | Status: AC
  Administered 2018-01-08: 01:00:00
  Filled 2018-01-07: qty 10

## 2018-01-07 MED ORDER — SODIUM CHLORIDE 0.9% FLUSH: 3.0000 mL | INTRAVENOUS | Status: DC | PRN

## 2018-01-07 MED ORDER — ANTICOAGULANT SODIUM CITRATE 4% (200MG/5ML) IV SOLN
5.0000 mL | Status: DC | PRN
  Filled 2018-01-07: qty 5

## 2018-01-07 MED ORDER — STERILE WATER FOR INJECTION IJ SOLN
INTRAMUSCULAR | Status: AC
  Administered 2018-01-07: 10 mL
  Filled 2018-01-07: qty 10

## 2018-01-07 MED ORDER — SODIUM CHLORIDE 0.9 % IV SOLN
250.0000 mL | INTRAVENOUS | Status: DC | PRN
  Administered 2018-01-09: 250 mL via INTRAVENOUS

## 2018-01-07 MED ORDER — VITAMIN B-1 100 MG PO TABS
100.0000 mg | ORAL_TABLET | Freq: Every day | ORAL | Status: DC
  Administered 2018-01-09 – 2018-01-17 (×9): 100 mg
  Filled 2018-01-07 (×9): qty 1

## 2018-01-07 MED ORDER — VECURONIUM BROMIDE 10 MG IV SOLR
10.0000 mg | INTRAVENOUS | Status: DC | PRN
  Administered 2018-01-07 – 2018-01-20 (×23): 10 mg via INTRAVENOUS
  Filled 2018-01-07 (×27): qty 10

## 2018-01-07 NOTE — Progress Notes (Signed)
[facility]  ROUNDING NOTE   Subjective:   CRRT UOP 280 UF 1110  Weaned off vasopressin and norepinephrine.    Wife at bedside.   Objective:  Vital signs in last 24 hours:  Temp:  [98.3 F (36.8 C)-99.1 F (37.3 C)] 98.3 F (36.8 C) (06/25 2357) Pulse Rate:  [121-125] 121 (06/26 0600) Resp:  [0-25] 25 (06/26 0600) BP: (83-102)/(56-75) 101/75 (06/26 0600) SpO2:  [97 %-99 %] 97 % (06/26 0600) FiO2 (%):  [40 %] 40 % (06/26 0347) Weight:  [143 kg (315 lb 4.1 oz)] 143 kg (315 lb 4.1 oz) (06/26 0145)  Weight change: 0.2 kg (7.1 oz) Filed Weights   01/05/18 0117 01/06/18 0100 01/07/18 0145  Weight: (!) 141.3 kg (311 lb 8.2 oz) (!) 142.8 kg (314 lb 13.1 oz) (!) 143 kg (315 lb 4.1 oz)    Intake/Output: I/O last 3 completed shifts: In: 4793.6 [I.V.:2458.5; NG/GT:1918.7; IV Piggyback:416.5] Out: 2325 [Urine:700; Other:1625]   Intake/Output this shift:  No intake/output data recorded.  Physical Exam: General: Critically ill  Head: ETT, OGT  Eyes: Scleral + edema  Neck: trachea midline  Lungs:  PRVC 40%  Heart: tachycardia  Abdomen:  Soft, nontender, bowel sounds    Extremities: ++edema  Neurologic: Intubated and sedated  Skin: Mottled appearance  Access: Right temporary femoral dialysis catheter    Basic Metabolic Panel: Recent Labs  Lab 01/06/18 0419 01/06/18 0758 01/06/18 1156 01/06/18 1546 01/06/18 1921 01/06/18 2342 01/07/18 0334 01/07/18 0401  NA 136 134* 134* 135 135 135 134* 135  K 3.9 3.8 3.7 3.7 3.8 4.1 4.0 4.0  CL 105 104 104 103 103 103 103 104  CO2 26 26 26 24 24 25 24 25   GLUCOSE 130* 144* 165* 148* 142* 138* 135* 136*  BUN 26* 28* 30* 30* 33* 32* 31* 33*  CREATININE 1.22 1.13 1.15 1.17 1.24 1.16 1.22 1.12  CALCIUM 7.5* 7.5* 7.5* 7.6* 7.7* 7.8* 7.9* 7.8*  MG 1.9 1.9  --  1.9  --  1.9 1.9  --   PHOS 2.3* 2.4* 2.3* 2.4* 2.6 2.7  --  2.7    Liver Function Tests: Recent Labs  Lab 01/03/18 0516 01/03/18 0959  01/05/18 0530   01/06/18 0423  01/06/18 1156 01/06/18 1546 01/06/18 1921 01/06/18 2342 01/07/18 0401  AST 240* 498*  --  1,984*  --  842*  --   --   --   --   --   --   ALT 217* 404*  --  2,352*  --  1,886*  --   --   --   --   --   --   ALKPHOS 62 54  --  57  --  58  --   --   --   --   --   --   BILITOT 2.5* 4.0*  --  2.2*  --  2.0*  --   --   --   --   --   --   PROT 6.7 5.5*  --  4.6*  --  4.8*  --   --   --   --   --   --   ALBUMIN 3.6 2.9*   < > 2.4*  2.5*   < > 2.4*   < > 2.3* 3.0* 2.7* 3.1* 3.0*   < > = values in this interval not displayed.   Recent Labs  Lab 01/03/18 1044  LIPASE 52*  AMYLASE 45   Recent Labs  Lab 01/03/18 1518  AMMONIA 64*    CBC: Recent Labs  Lab 01/03/18 0516 01/03/18 0959 01/04/18 0415 01/05/18 0530 01/07/18 0334  WBC 13.2* 14.1* 21.8* 12.6* 9.9  NEUTROABS  --   --   --  10.9* 8.2*  HGB 12.2* 11.0* 10.0* 9.5* 9.4*  HCT 37.0* 33.4* 30.3* 28.4* 29.0*  MCV 101.4* 101.2* 98.6 97.8 102.0*  PLT 365 213 96* 84* 41*    Cardiac Enzymes: Recent Labs  Lab 12/13/2017 1152 01/03/18 0516 01/03/18 0959 01/03/18 1518  CKTOTAL  --  180  --   --   TROPONINI 0.04* 0.04* 0.04* 0.06*    BNP: Invalid input(s): POCBNP  CBG: Recent Labs  Lab 01/06/18 1153 01/06/18 1548 01/06/18 1917 01/06/18 2341 01/07/18 0324  GLUCAP 145* 119* 135* 139* 126*    Microbiology: Results for orders placed or performed during the hospital encounter of 01/10/2018  Culture, blood (routine x 2)     Status: None (Preliminary result)   Collection Time: 01/03/18 10:42 AM  Result Value Ref Range Status   Specimen Description BLOOD LT [facility]  Final   Special Requests   Final    BOTTLES DRAWN AEROBIC AND ANAEROBIC Blood Culture adequate volume   Culture   Final    NO GROWTH 4 DAYS Performed at [facility], [address], [place], [place] [address]    Report Status PENDING  Incomplete  MRSA PCR Screening     Status: None   Collection Time: 01/03/18 10:56 AM  Result  Value Ref Range Status   MRSA by PCR NEGATIVE NEGATIVE Final    Comment:        The GeneXpert MRSA Assay (FDA approved for NASAL specimens only), is one component of a comprehensive MRSA colonization surveillance program. It is not intended to diagnose MRSA infection nor to guide or monitor treatment for MRSA infections. Performed at [facility], [address] [place], [place] [address]   CULTURE, BLOOD (ROUTINE X 2) w Reflex to ID Panel     Status: None (Preliminary result)   Collection Time: 01/03/18  1:25 PM  Result Value Ref Range Status   Specimen Description BLOOD LINE  Final   Special Requests   Final    BOTTLES DRAWN AEROBIC AND ANAEROBIC Blood Culture adequate volume   Culture   Final    NO GROWTH 4 DAYS Performed at [facility], [address], [place], [place] [address]    Report Status PENDING  Incomplete  Culture, Urine     Status: None   Collection Time: 01/03/18  4:49 PM  Result Value Ref Range Status   Specimen Description   Final    URINE, RANDOM Performed at [facility], [address], [place], [place] [address]    Special Requests   Final    NONE Performed at [facility], [address] [place], [place] [address]    Culture   Final    NO GROWTH Performed at [facility], [place] [address], [place], [place] [address]    Report Status 01/04/2018 FINAL  Final  Culture, expectorated sputum-assessment     Status: None   Collection Time: 01/03/18  5:53 PM  Result Value Ref Range Status   Specimen Description TRACHEAL ASPIRATE  Final   Special Requests NONE  Final   Sputum evaluation   Final    THIS SPECIMEN IS ACCEPTABLE FOR SPUTUM CULTURE Performed at [facility], [address], [place], [place] [address]  Report Status 01/03/2018 FINAL  Final  Culture, respiratory (NON-Expectorated)     Status: None   Collection Time: 01/03/18  5:53 PM  Result Value Ref Range Status   Specimen  Description   Final    TRACHEAL ASPIRATE Performed at [facility], [address] [place], [place] [address]    Special Requests   Final    NONE Reflexed from [id number] Performed at [facility], [address] [place], [place] [address]    Gram Stain   Final    FEW WBC PRESENT, PREDOMINANTLY PMN RARE SQUAMOUS EPITHELIAL CELLS PRESENT FEW GRAM POSITIVE COCCI IN CLUSTERS FEW GRAM NEGATIVE COCCOBACILLI    Culture   Final    Consistent with normal respiratory flora. Performed at [facility], [place] [address], [place],  [address]    Report Status 01/06/2018 FINAL  Final    Coagulation Studies: Recent Labs    01/05/18 0530  LABPROT 25.4*  INR 2.33    Urinalysis: No results for input(s): COLORURINE, LABSPEC, PHURINE, GLUCOSEU, HGBUR, BILIRUBINUR, KETONESUR, PROTEINUR, UROBILINOGEN, NITRITE, LEUKOCYTESUR in the last 72 hours.  Invalid input(s): APPERANCEUR    Imaging: US Venous Img Lower Bilateral  Result Date: 01/05/2018 CLINICAL DATA:  Bilateral lower extremity swelling EXAM: BILATERAL LOWER EXTREMITY VENOUS DOPPLER ULTRASOUND TECHNIQUE: Gray-scale sonography with graded compression, as well as color Doppler and duplex ultrasound were performed to evaluate the lower extremity deep venous systems from the level of the common femoral vein and including the common femoral, femoral, profunda femoral, popliteal and calf veins including the posterior tibial, peroneal and gastrocnemius veins when visible. The superficial great saphenous vein was also interrogated. Spectral Doppler was utilized to evaluate flow at rest and with distal augmentation maneuvers in the common femoral, femoral and popliteal veins. COMPARISON:  None. FINDINGS: RIGHT LOWER EXTREMITY Common Femoral Vein: Unable to be evaluated due to overlying dressing from recent catheter placement Saphenofemoral Junction: Unable to be evaluated due to overlying dressing from recent catheter placement  Profunda Femoral Vein: Unable to be evaluated due to overlying dressing from recent catheter placement Femoral Vein: No evidence of thrombus. Normal compressibility, respiratory phasicity and response to augmentation. Popliteal Vein: No evidence of thrombus. Normal compressibility, respiratory phasicity and response to augmentation. Calf Veins: No evidence of thrombus. Normal compressibility and flow on color Doppler imaging. Superficial Great Saphenous Vein: No evidence of thrombus. Normal compressibility. Venous Reflux:  None. Other Findings:  None. LEFT LOWER EXTREMITY Common Femoral Vein: No evidence of thrombus. Normal compressibility, respiratory phasicity and response to augmentation. Saphenofemoral Junction: No evidence of thrombus. Normal compressibility and flow on color Doppler imaging. Profunda Femoral Vein: No evidence of thrombus. Normal compressibility and flow on color Doppler imaging. Femoral Vein: No evidence of thrombus. Normal compressibility, respiratory phasicity and response to augmentation. Popliteal Vein: No evidence of thrombus. Normal compressibility, respiratory phasicity and response to augmentation. Calf Veins: No evidence of thrombus. Normal compressibility and flow on color Doppler imaging. Superficial Great Saphenous Vein: No evidence of thrombus. Normal compressibility. Venous Reflux:  None. Other Findings:  None. IMPRESSION: No evidence of deep venous thrombosis. The right femoral vein has a central venous line within and could not be well evaluated. Electronically Signed   By: Inez Catalina M.D.   On: 01/05/2018 15:22     Medications:   . sodium chloride    . albumin human 25 g (01/07/18 0502)  . cisatracurium (NIMBEX) infusion 0.5 mcg/kg/min (01/07/18 6789)  . feeding supplement (VITAL HIGH PROTEIN) 60  mL/hr at 01/06/18 2000  . fentaNYL infusion INTRAVENOUS 350 mcg/hr (01/07/18 0308)  . heparin 1,800 Units/hr (01/07/18 0603)  . midazolam (VERSED) infusion 6 mg/hr  (01/07/18 0604)  . norepinephrine (LEVOPHED) Adult infusion Stopped (01/07/18 0309)  . piperacillin-tazobactam (ZOSYN)  IV 3.375 g (01/07/18 0501)  . pureflow 3 each (01/07/18 9233)  . vasopressin (PITRESSIN) infusion - *FOR SHOCK* Stopped (01/05/18 2225)   . B-complex with vitamin C  1 tablet Per Tube QHS  . chlorhexidine gluconate (MEDLINE KIT)  15 mL Mouth Rinse BID  . famotidine  20 mg Per Tube BID  . feeding supplement (PRO-STAT SUGAR FREE 64)  60 mL Per Tube BID  . insulin aspart  0-15 Units Subcutaneous Q4H  . ipratropium-albuterol  3 mL Nebulization Q6H  . mouth rinse  15 mL Mouth Rinse 10 times per day  . multivitamin-lutein  1 capsule Per Tube Daily  . polyvinyl alcohol  1 drop Both Eyes TID  . sodium chloride flush  3 mL Intravenous Q12H  . thiamine injection  100 mg Intravenous Daily   sodium chloride, acetaminophen **OR** acetaminophen, heparin, heparin, iopamidol, [DISCONTINUED] ondansetron **OR** ondansetron (ZOFRAN) IV, sodium chloride flush  Assessment/ Plan:  [name] is a 52 y.o. white male with diabetes mellitus type 2, hypertension, chronic systolic heart failure ejection fraction 45%, who was admitted to [facility] on 01/04/2018 for evaluation of abdominal discomfort and bloating.   Patient had decompensation on January 03, 2018 with severe hypotension requiring multiple pressors, acute respiratory failure, shock liver and acute renal failure.  1.  Acute renal failure secondary to severe hypotension, and also exposed to contrast with CTA. Baseline careatinine 1.14, 12/12/16 2.  Hyperkalemia. 3.  Metabolic acidosis with elevated lactic acid level, pt was on metformin and TCAs as outpt.  4.  Acute respiratory failure. 5.  Elevated liver enzymes. 6. Hyponatremia 7. Hypoalbuminemia.   Plan:  Continue CRRT. Increase ultrafiltration to help with volume status.  Oliguric urine output - continue to monitor.  Continue IV albumin.    LOS: 4 Ryan Webb 6/26/20198:04 AM

## 2018-01-07 NOTE — Progress Notes (Signed)
ANTICOAGULATION CONSULT NOTE - Follow Up Consult  Pharmacy Consult for Heparin Drip Indication: atrial fibrillation  No Known Allergies  Patient Measurements: Height: 5\' 9"  (175.3 cm) Weight: (!) 315 lb 4.1 oz (143 kg) IBW/kg (Calculated) : 70.7 Heparin Dosing Weight: 98.9 kg  Vital Signs: Temp: 98.3 F (36.8 C) (06/25 2357) Temp Source: Oral (06/25 2357) BP: 95/72 (06/26 0400) Pulse Rate: 122 (06/26 0400)  Labs: Recent Labs    01/05/18 0530  01/06/18 0423  01/06/18 1546 01/06/18 1921 01/06/18 2342 01/07/18 0334  HGB 9.5*  --   --   --   --   --   --   --   HCT 28.4*  --   --   --   --   --   --   --   PLT 84*  --   --   --   --   --   --   --   LABPROT 25.4*  --   --   --   --   --   --   --   INR 2.33  --   --   --   --   --   --   --   HEPARINUNFRC 0.52  --  0.52  --   --   --   --  0.40  CREATININE 1.34*   < >  --    < > 1.17 1.24 1.16  --    < > = values in this interval not displayed.    Estimated Creatinine Clearance: 106.1 mL/min (by C-G formula based on SCr of 1.16 mg/dL).   Assessment: Patient is 52yo male admitted with tachycardia. He was initiated on Heparin drip this morning for possible PE. Second consult entered for Heparin for afib.  Heparin was running at 1600 units/hr, order was discontinued at ~13:00 and then 2nd consult entered.  6/23 Heparin currently running at 1800 units/hr. Noted that patient now with thrombocytopenia (possibly due to sepsis, could be DIC) MD aware and watching closely.  Goal of Therapy:  Heparin level 0.3-0.7 units/ml Monitor platelets by anticoagulation protocol: Yes   Plan:  06/24 @ 0500 HL 0.52 therapeutic. Will continue rate at 1800 units/hr and will recheck HL w/ am labs.  06/25 @ 0423 = 0.52 .  Will continue this pt on current rate and recheck HL on 6/26 with AM labs.   06/26 @ 0330 = 0.4 .    Will continue this pt on current rate and recheck HL on 6/27 with AM labs.   Nataniel Gasper D Clinical  Pharmacist 01/07/2018

## 2018-01-07 NOTE — Progress Notes (Addendum)
ANTICOAGULATION CONSULT NOTE   Pharmacy Consult for Argatroban Dosing  Indication: atrial fibrillation  Allergies  Allergen Reactions  . Heparin     Possible HIT 6/26    Patient Measurements: Height: 5\' 9"  (175.3 cm) Weight: (!) 315 lb 4.1 oz (143 kg) IBW/kg (Calculated) : 70.7 Heparin Dosing Weight: 98.9 kg  Vital Signs: Temp: 97.7 F (36.5 C) (06/26 1600) Temp Source: Oral (06/26 1600) BP: 96/64 (06/26 1600) Pulse Rate: 122 (06/26 1600)  Labs: Recent Labs    01/05/18 0530  01/06/18 0423  01/07/18 0334 01/07/18 0401 01/07/18 0811 01/07/18 1141 01/07/18 1329 01/07/18 1600  HGB 9.5*  --   --   --  9.4*  --   --   --   --   --   HCT 28.4*  --   --   --  29.0*  --   --   --   --   --   PLT 84*  --   --   --  41*  --   --   --   --   --   APTT  --   --   --   --   --   --   --   --  89* 67*  LABPROT 25.4*  --   --   --   --   --   --   --   --   --   INR 2.33  --   --   --   --   --   --   --   --   --   HEPARINUNFRC 0.52  --  0.52  --  0.40  --   --   --   --   --   CREATININE 1.34*   < >  --    < > 1.22 1.12 1.26* 1.25*  --   --    < > = values in this interval not displayed.    Estimated Creatinine Clearance: 98.5 mL/min (A) (by C-G formula based on SCr of 1.25 mg/dL (H)).   Assessment: Pharmacy consulted for argatroban dosing for 52 yo male ICU patient currently requiring mechanical ventilation and CRRT. Patient previously on Heparin drip for atrial fibrillation.   Goal of Therapy:  APTT 50-90 Monitor platelets by anticoagulation protocol: Yes   Plan:  Patient being worked up for possible HIT. Platelets are currently 41 and HIT score is 5. Heparin Serotonin Assay ordered.   Argatroban initiated at 0.885mcg/kg/min using adjusted body weight. Second aPTT is within target range. Will obtain follow up aPTT with am labs.   Pharmacy will continue to monitor and adjust per consult.   Lanyah Spengler L 01/07/2018

## 2018-01-07 NOTE — Progress Notes (Signed)
[employer] -  at [facility]   PATIENT NAME: Ryan Webb    MR#:  [id number]  DATE OF BIRTH:  13-Feb-1966  SUBJECTIVE:   Patient remains intubated and sedated on vent.  Continue CRRT  REVIEW OF SYSTEMS:    unAble to obtain  tolerating Diet: Tube feeds        DRUG ALLERGIES:   Allergies  Allergen Reactions  . Heparin     Possible HIT 6/26    VITALS:  Blood pressure 102/64, pulse (!) 120, temperature 97.8 F (36.6 C), temperature source Oral, resp. rate (!) 25, height 5\' 9"  (1.753 m), weight (!) 143 kg (315 lb 4.1 oz), SpO2 95 %.  PHYSICAL EXAMINATION:  Constitutional: Appears obese sedated on vent HENT: Normocephalic. . Intubated Eyes: , no scleral icterus.  Neck: Normal ROM. Neck supple. No JVD. No tracheal deviation. CVS: RRR, S1/S2 +, no murmurs, no gallops, no carotid bruit.  Pulmonary: Effort and breath sounds normal, no stridor, rhonchi, wheezes, rales.  Abdominal: Soft. BS +,  no distension, tenderness, rebound or guarding.  Musculoskeletal: sedated Neuro:sedated     . Skin: Skin is warm and dry. No rash noted. Psychiatric: sedated    LABORATORY PANEL:   CBC Recent Labs  Lab 01/07/18 0334  WBC 9.9  HGB 9.4*  HCT 29.0*  PLT 41*   ------------------------------------------------------------------------------------------------------------------  Chemistries  Recent Labs  Lab 01/06/18 0423  01/07/18 0811  NA  --    < > 135  K  --    < > 4.2  CL  --    < > 104  CO2  --    < > 25  GLUCOSE  --    < > 134*  BUN  --    < > 34*  CREATININE  --    < > 1.26*  CALCIUM  --    < > 7.9*  MG  --    < > 2.1  AST 842*  --   --   ALT 1,886*  --   --   ALKPHOS 58  --   --   BILITOT 2.0*  --   --    < > = values in this interval not displayed.   ------------------------------------------------------------------------------------------------------------------  Cardiac Enzymes Recent Labs  Lab 01/03/18 0516 01/03/18 0959  01/03/18 1518  TROPONINI 0.04* 0.04* 0.06*   ------------------------------------------------------------------------------------------------------------------  RADIOLOGY:  US Venous Img Lower Bilateral  Result Date: 01/05/2018 CLINICAL DATA:  Bilateral lower extremity swelling EXAM: BILATERAL LOWER EXTREMITY VENOUS DOPPLER ULTRASOUND TECHNIQUE: Gray-scale sonography with graded compression, as well as color Doppler and duplex ultrasound were performed to evaluate the lower extremity deep venous systems from the level of the common femoral vein and including the common femoral, femoral, profunda femoral, popliteal and calf veins including the posterior tibial, peroneal and gastrocnemius veins when visible. The superficial great saphenous vein was also interrogated. Spectral Doppler was utilized to evaluate flow at rest and with distal augmentation maneuvers in the common femoral, femoral and popliteal veins. COMPARISON:  None. FINDINGS: RIGHT LOWER EXTREMITY Common Femoral Vein: Unable to be evaluated due to overlying dressing from recent catheter placement Saphenofemoral Junction: Unable to be evaluated due to overlying dressing from recent catheter placement Profunda Femoral Vein: Unable to be evaluated due to overlying dressing from recent catheter placement Femoral Vein: No evidence of thrombus. Normal compressibility, respiratory phasicity and response to augmentation. Popliteal Vein: No evidence of thrombus. Normal compressibility, respiratory phasicity and response to augmentation. Calf Veins: No evidence  of thrombus. Normal compressibility and flow on color Doppler imaging. Superficial Great Saphenous Vein: No evidence of thrombus. Normal compressibility. Venous Reflux:  None. Other Findings:  None. LEFT LOWER EXTREMITY Common Femoral Vein: No evidence of thrombus. Normal compressibility, respiratory phasicity and response to augmentation. Saphenofemoral Junction: No evidence of thrombus. Normal  compressibility and flow on color Doppler imaging. Profunda Femoral Vein: No evidence of thrombus. Normal compressibility and flow on color Doppler imaging. Femoral Vein: No evidence of thrombus. Normal compressibility, respiratory phasicity and response to augmentation. Popliteal Vein: No evidence of thrombus. Normal compressibility, respiratory phasicity and response to augmentation. Calf Veins: No evidence of thrombus. Normal compressibility and flow on color Doppler imaging. Superficial Great Saphenous Vein: No evidence of thrombus. Normal compressibility. Venous Reflux:  None. Other Findings:  None. IMPRESSION: No evidence of deep venous thrombosis. The right femoral vein has a central venous line within and could not be well evaluated. Electronically Signed   By: Alcide CleverMark  Lukens M.D.   On: 01/05/2018 15:22     ASSESSMENT AND PLAN:   52 year old male with obesity, diabetes, hypertension came in with tachyarrhythmia and was noted to be in septic shock  1.  Septic shock with multiorgan failure. Continue to wean pressors.  Continue vent management as per ICU Continue broad-spectrum antibiotics   2.  Ectopic atrial tachycardia versus atrial flutter-remains tachycardic  CT Angio of the chest and pelvis negative for any PE or aortic dissection. Appreciate cardiology consult Echocardiogram with LV ejection fraction of 45% which is stable from prior.  3.  Acute renal failure due toATN secondary to septic shock and contrast exposure due to CT scans. Remains on CRRT at this time. Management per nephrology  4.  Thrombocytopenia: This is from sepsis and acute illness.   Needs CBC for tomorrow.  5.  Acute respiratory failure: Due to sepsis and multiorgan failure Remains on ventilator and management per ICU team   6.  Elevated LFTs due to shock liver with multiorgan failure due to sepsis.  LFTs are improving GI consultation appreciated      Management plans discussed with  nephrology. CODE STATUS: FULL  TOTAL TIME TAKING CARE OF THIS PATIENT: 19 minutes.     POSSIBLE D/C ??, DEPENDING ON CLINICAL CONDITION.   Colen Eltzroth M.D on 01/07/2018 at 10:17 AM  Between 7am to 6pm - Pager - [phone] After 6pm go to [email or web address] - password [email or web address]  [facility]  Office  [phone]  CC: Primary care physician; Anola Gurneyhauvin, Robert, PA  Note: This dictation was prepared with Dragon dictation along with smaller phrase technology. Any transcriptional errors that result from this process are unintentional.

## 2018-01-07 NOTE — Progress Notes (Signed)
Attempted to wean patient off of nimbex gtt today. Unsuccessful d/t patient desaturating and increasing etco2. Per Dr. Belia HemanKasa start patient on PRN vec, stop nimbex gtt, and increase versed gtt as needed for patient comfort.  Orders completed. Family updated on plan of care.  Trudee KusterBrandi R [place]

## 2018-01-07 NOTE — Progress Notes (Signed)
CRITICAL CARE NOTE  CC  follow up respiratory failure  SUBJECTIVE Patient remains critically ill Prognosis is guarded On full vent support On CRRT remains on paralytic     SIGNIFICANT EVENTS  STUDIES:  6/22 ECHO  CULTURES: MRSA PCR- Neg  ANTIBIOTICS: 6/22Zosyn; Vancomycin 6/24 Vancomycin D/C  SIGNIFICANT EVENTS: 6/21 admission to hospital 6/22 transfer to ICU,    LINES/TUBES: 6/22  Foley 6/22 R-IJ TLC, R- Femoral hemodialysis cath 6/22 L- Femoral Arterial line 6/22 Endotracheal tube     BP 101/75   Pulse (!) 121   Temp 98.3 F (36.8 C) (Oral)   Resp (!) 25   Ht 5' 9"  (1.753 m)   Wt (!) 315 lb 4.1 oz (143 kg)   SpO2 97%   BMI 46.56 kg/m    REVIEW OF SYSTEMS  PATIENT IS UNABLE TO PROVIDE COMPLETE REVIEW OF SYSTEM S DUE TO SEVERE CRITICAL ILLNESS AND ENCEPHALOPATHY   PHYSICAL EXAMINATION:  GENERAL:critically ill appearing, +resp distress HEAD: Normocephalic, atraumatic.  EYES: Pupils equal, round, reactive to light.  No scleral icterus.  MOUTH: Moist mucosal membrane. NECK: Supple. No thyromegaly. No nodules. No JVD.  PULMONARY: +rhonchi, +wheezing CARDIOVASCULAR: S1 and S2. Regular rate and rhythm. No murmurs, rubs, or gallops.  GASTROINTESTINAL: Soft, nontender, -distended. No masses. Positive bowel sounds. No hepatosplenomegaly.  MUSCULOSKELETAL: No swelling, clubbing, or edema.  NEUROLOGIC: obtunded, GCS<8 SKIN:intact,warm,dry  ASSESSMENT AND PLAN SYNOPSIS 52 yo morbidly obese white male with severe resp failure from severe acidosis from presumed metformin toxicity complicated by severe encephalopathy and severe renal failure requiring  CRRT  Severe Hypoxic and Hypercapnic Respiratory Failure -continue Full MV support -continue Bronchodilator Therapy -Wean Fio2 and PEEP as tolerated -will perform SAT/SBt when respiratory parameters are met   Renal Failure-most likely due to ATN -follow chem 7 -follow UO -continue Foley  Catheter-assess need On CRRT follow up nephrology recs  NEUROLOGY - intubated and sedated - minimal sedation to achieve a RASS goal: -1   Septic shock -use vasopressors to keep MAP>65 as needed -follow ABG and LA -follow up cultures   ID -follow up cultures   DVT/GI PRX ordered TRANSFUSIONS AS NEEDED MONITOR FSBS ASSESS the need for LABS as needed   Critical Care Time devoted to patient care services described in this note is 45 minutes.   Overall, patient is critically ill, prognosis is guarded.  Patient with Multiorgan failure and at high risk for cardiac arrest and death.    Corrin Parker, M.D.  [facility]  Medical Director [place] Director [facility]

## 2018-01-08 ENCOUNTER — Inpatient Hospital Stay: Payer: BLUE CROSS/BLUE SHIELD

## 2018-01-08 LAB — BLOOD GAS, ARTERIAL
Acid-base deficit: 0.2 mmol/L (ref 0.0–2.0)
Bicarbonate: 25.9 mmol/L (ref 20.0–28.0)
FIO2: 40
MECHVT: 500 mL
O2 Saturation: 98.9 %
PEEP: 5 cmH2O
Patient temperature: 37
RATE: 25 {breaths}/min
pCO2 arterial: 48 mmHg (ref 32.0–48.0)
pH, Arterial: 7.34 — ABNORMAL LOW (ref 7.350–7.450)
pO2, Arterial: 136 mmHg — ABNORMAL HIGH (ref 83.0–108.0)

## 2018-01-08 LAB — RENAL FUNCTION PANEL
ALBUMIN: 3.6 g/dL (ref 3.5–5.0)
ALBUMIN: 3.8 g/dL (ref 3.5–5.0)
ALBUMIN: 3.9 g/dL (ref 3.5–5.0)
ANION GAP: 8 (ref 5–15)
ANION GAP: 9 (ref 5–15)
Albumin: 3.6 g/dL (ref 3.5–5.0)
Albumin: 4.2 g/dL (ref 3.5–5.0)
Albumin: 3.7 g/dL (ref 3.5–5.0)
Anion gap: 10 (ref 5–15)
Anion gap: 8 (ref 5–15)
Anion gap: 8 (ref 5–15)
Anion gap: 9 (ref 5–15)
BUN: 36 mg/dL — ABNORMAL HIGH (ref 6–20)
BUN: 36 mg/dL — ABNORMAL HIGH (ref 6–20)
BUN: 38 mg/dL — ABNORMAL HIGH (ref 6–20)
BUN: 39 mg/dL — ABNORMAL HIGH (ref 6–20)
BUN: 39 mg/dL — ABNORMAL HIGH (ref 6–20)
BUN: 40 mg/dL — AB (ref 6–20)
CALCIUM: 8.2 mg/dL — AB (ref 8.9–10.3)
CHLORIDE: 102 mmol/L (ref 98–111)
CHLORIDE: 102 mmol/L (ref 98–111)
CHLORIDE: 103 mmol/L (ref 98–111)
CHLORIDE: 103 mmol/L (ref 98–111)
CO2: 21 mmol/L — AB (ref 22–32)
CO2: 22 mmol/L (ref 22–32)
CO2: 23 mmol/L (ref 22–32)
CO2: 24 mmol/L (ref 22–32)
CO2: 24 mmol/L (ref 22–32)
CO2: 25 mmol/L (ref 22–32)
CREATININE: 1.2 mg/dL (ref 0.61–1.24)
CREATININE: 1.16 mg/dL (ref 0.61–1.24)
Calcium: 8.1 mg/dL — ABNORMAL LOW (ref 8.9–10.3)
Calcium: 8.2 mg/dL — ABNORMAL LOW (ref 8.9–10.3)
Calcium: 8.5 mg/dL — ABNORMAL LOW (ref 8.9–10.3)
Calcium: 8.4 mg/dL — ABNORMAL LOW (ref 8.9–10.3)
Calcium: 8.5 mg/dL — ABNORMAL LOW (ref 8.9–10.3)
Chloride: 103 mmol/L (ref 98–111)
Chloride: 103 mmol/L (ref 98–111)
Creatinine, Ser: 1.22 mg/dL (ref 0.61–1.24)
Creatinine, Ser: 1.23 mg/dL (ref 0.61–1.24)
Creatinine, Ser: 1.29 mg/dL — ABNORMAL HIGH (ref 0.61–1.24)
Creatinine, Ser: 1.37 mg/dL — ABNORMAL HIGH (ref 0.61–1.24)
GFR calc Af Amer: >60 mL/min (ref >60)
GFR calc Af Amer: >60 mL/min (ref >60)
GFR calc non Af Amer: >60 mL/min (ref >60)
GFR calc non Af Amer: >60 mL/min (ref >60)
GFR, EST NON AFRICAN AMERICAN: 58 mL/min — AB (ref >60)
GLUCOSE: 165 mg/dL — AB (ref 70–99)
GLUCOSE: 143 mg/dL — AB (ref 70–99)
GLUCOSE: 163 mg/dL — AB (ref 70–99)
Glucose, Bld: 123 mg/dL — ABNORMAL HIGH (ref 70–99)
Glucose, Bld: 148 mg/dL — ABNORMAL HIGH (ref 70–99)
Glucose, Bld: 151 mg/dL — ABNORMAL HIGH (ref 70–99)
PHOSPHORUS: 2.9 mg/dL (ref 2.5–4.6)
PHOSPHORUS: 2.9 mg/dL (ref 2.5–4.6)
PHOSPHORUS: 3.4 mg/dL (ref 2.5–4.6)
PHOSPHORUS: 3.9 mg/dL (ref 2.5–4.6)
POTASSIUM: 4.2 mmol/L (ref 3.5–5.1)
POTASSIUM: 4.6 mmol/L (ref 3.5–5.1)
POTASSIUM: 4.4 mmol/L (ref 3.5–5.1)
Phosphorus: 3.1 mg/dL (ref 2.5–4.6)
Phosphorus: 2.9 mg/dL (ref 2.5–4.6)
Potassium: 4.1 mmol/L (ref 3.5–5.1)
Potassium: 4.2 mmol/L (ref 3.5–5.1)
Potassium: 4.4 mmol/L (ref 3.5–5.1)
SODIUM: 135 mmol/L (ref 135–145)
Sodium: 134 mmol/L — ABNORMAL LOW (ref 135–145)
Sodium: 134 mmol/L — ABNORMAL LOW (ref 135–145)
Sodium: 134 mmol/L — ABNORMAL LOW (ref 135–145)
Sodium: 135 mmol/L (ref 135–145)
Sodium: 135 mmol/L (ref 135–145)

## 2018-01-08 LAB — GLUCOSE, CAPILLARY
GLUCOSE-CAPILLARY: 130 mg/dL — AB (ref 70–99)
GLUCOSE-CAPILLARY: 132 mg/dL — AB (ref 70–99)
GLUCOSE-CAPILLARY: 137 mg/dL — AB (ref 70–99)
GLUCOSE-CAPILLARY: 151 mg/dL — AB (ref 70–99)
Glucose-Capillary: 145 mg/dL — ABNORMAL HIGH (ref 70–99)
Glucose-Capillary: 124 mg/dL — ABNORMAL HIGH (ref 70–99)
Glucose-Capillary: 112 mg/dL — ABNORMAL HIGH (ref 70–99)
Glucose-Capillary: 140 mg/dL — ABNORMAL HIGH (ref 70–99)

## 2018-01-08 LAB — COMPREHENSIVE METABOLIC PANEL
ALK PHOS: 102 U/L (ref 38–126)
ALT: 833 U/L — AB (ref 0–44)
ANION GAP: 9 (ref 5–15)
AST: 134 U/L — ABNORMAL HIGH (ref 15–41)
Albumin: 4.1 g/dL (ref 3.5–5.0)
BUN: 36 mg/dL — ABNORMAL HIGH (ref 6–20)
CHLORIDE: 103 mmol/L (ref 98–111)
CO2: 24 mmol/L (ref 22–32)
Calcium: 8.6 mg/dL — ABNORMAL LOW (ref 8.9–10.3)
Creatinine, Ser: 1.22 mg/dL (ref 0.61–1.24)
Glucose, Bld: 122 mg/dL — ABNORMAL HIGH (ref 70–99)
Potassium: 4.7 mmol/L (ref 3.5–5.1)
Sodium: 136 mmol/L (ref 135–145)
Total Bilirubin: 1.8 mg/dL — ABNORMAL HIGH (ref 0.3–1.2)
Total Protein: 6.8 g/dL (ref 6.5–8.1)

## 2018-01-08 LAB — LACTIC ACID, PLASMA
LACTIC ACID, VENOUS: 1.8 mmol/L (ref 0.5–1.9)
Lactic Acid, Venous: 1.8 mmol/L (ref 0.5–1.9)
Lactic Acid, Venous: 1.4 mmol/L (ref 0.5–1.9)

## 2018-01-08 LAB — CULTURE, BLOOD (ROUTINE X 2)
CULTURE: NO GROWTH
Culture: NO GROWTH
Special Requests: ADEQUATE
Special Requests: ADEQUATE

## 2018-01-08 LAB — MAGNESIUM
MAGNESIUM: 2.1 mg/dL (ref 1.7–2.4)
Magnesium: 2.2 mg/dL (ref 1.7–2.4)
Magnesium: 2.1 mg/dL (ref 1.7–2.4)
Magnesium: 2 mg/dL (ref 1.7–2.4)
Magnesium: 2 mg/dL (ref 1.7–2.4)
Magnesium: 2 mg/dL (ref 1.7–2.4)

## 2018-01-08 LAB — CBC
HCT: 29.6 % — ABNORMAL LOW (ref 40.0–52.0)
Hemoglobin: 9.3 g/dL — ABNORMAL LOW (ref 13.0–18.0)
MCH: 32.6 pg (ref 26.0–34.0)
MCHC: 31.4 g/dL — ABNORMAL LOW (ref 32.0–36.0)
MCV: 103.7 fL — ABNORMAL HIGH (ref 80.0–100.0)
Platelets: 54 10*3/uL — ABNORMAL LOW (ref 150–440)
RBC: 2.86 MIL/uL — ABNORMAL LOW (ref 4.40–5.90)
RDW: 18.3 % — ABNORMAL HIGH (ref 11.5–14.5)
WBC: 11.7 10*3/uL — ABNORMAL HIGH (ref 3.8–10.6)

## 2018-01-08 LAB — HEPARIN INDUCED PLATELET AB (HIT ANTIBODY): HEPARIN INDUCED PLT AB: 0.176 {OD_unit} (ref 0.000–0.400)

## 2018-01-08 LAB — PROTIME-INR
INR: 3.88
Prothrombin Time: 37.8 s — ABNORMAL HIGH (ref 11.4–15.2)

## 2018-01-08 LAB — CALCIUM, IONIZED: Calcium, Ionized, Serum: 4.3 mg/dL — ABNORMAL LOW (ref 4.5–5.6)

## 2018-01-08 LAB — TRIGLYCERIDES: TRIGLYCERIDES: 168 mg/dL — AB (ref <150)

## 2018-01-08 LAB — APTT: aPTT: 83 s — ABNORMAL HIGH (ref 24–36)

## 2018-01-08 MED ORDER — PRO-STAT SUGAR FREE PO LIQD
60.0000 mL | Freq: Every day | ORAL | Status: DC
  Administered 2018-01-08 – 2018-01-18 (×45): 60 mL

## 2018-01-08 MED ORDER — VITAL HIGH PROTEIN PO LIQD
1000.0000 mL | ORAL | Status: DC
  Administered 2018-01-08 – 2018-01-14 (×9): 1000 mL

## 2018-01-08 MED ORDER — PROPOFOL 1000 MG/100ML IV EMUL
INTRAVENOUS | Status: AC
  Filled 2018-01-08: qty 100

## 2018-01-08 MED ORDER — PROPOFOL 1000 MG/100ML IV EMUL
5.0000 ug/kg/min | INTRAVENOUS | Status: DC
  Administered 2018-01-08 (×2): 10 ug/kg/min via INTRAVENOUS
  Administered 2018-01-09: 20 ug/kg/min via INTRAVENOUS
  Administered 2018-01-09 (×2): 10 ug/kg/min via INTRAVENOUS
  Administered 2018-01-10 (×2): 30 ug/kg/min via INTRAVENOUS
  Administered 2018-01-10: 10 ug/kg/min via INTRAVENOUS
  Administered 2018-01-10: 20 ug/kg/min via INTRAVENOUS
  Administered 2018-01-11: 15 ug/kg/min via INTRAVENOUS
  Filled 2018-01-08 (×10): qty 100

## 2018-01-08 MED ORDER — STERILE WATER FOR INJECTION IJ SOLN
INTRAMUSCULAR | Status: AC
  Administered 2018-01-08: 03:00:00
  Filled 2018-01-08: qty 10

## 2018-01-08 MED ORDER — ADULT MULTIVITAMIN LIQUID CH
15.0000 mL | Freq: Every day | ORAL | Status: DC
  Administered 2018-01-09 – 2018-01-13 (×5): 15 mL
  Filled 2018-01-08 (×5): qty 15

## 2018-01-08 MED ORDER — STERILE WATER FOR INJECTION IJ SOLN
INTRAMUSCULAR | Status: AC
  Filled 2018-01-08: qty 10

## 2018-01-08 NOTE — Progress Notes (Addendum)
[employer] [facility] [place] [facility]   PATIENT NAME: Ryan Webb    MR#:  [id number]  DATE OF BIRTH:  09-27-1965  SUBJECTIVE:   Patient is critically ill on CRRT remains intubated and sedated Paralytic was turned off yesterday. As per Dr. Belia Heman he will try to wean patient off of ventilator  REVIEW OF SYSTEMS:    unAble to obtain  tolerating Diet: Tube feeds        DRUG ALLERGIES:   Allergies  Allergen Reactions  . Heparin     Possible HIT 6/26    VITALS:  Blood pressure 103/77, pulse (!) 125, temperature 98.8 F (37.1 C), temperature source Oral, resp. rate (!) 25, height 5\' 9"  (1.753 m), weight (!) 145.6 kg (320 lb 15.8 oz), SpO2 96 %.  PHYSICAL EXAMINATION:  Constitutional: Appears obese sedated on vent HENT: Normocephalic. . Intubated Eyes: , no scleral icterus.  Neck: Normal ROM. Neck supple. No JVD. No tracheal deviation. CVS: RRR, S1/S2 +, no murmurs, no gallops, no carotid bruit.  Pulmonary: Effort and breath sounds normal, no stridor, rhonchi, wheezes, rales.  Abdominal: Soft. BS +,  no distension, tenderness, rebound or guarding.  Musculoskeletal: sedated Neuro:sedated     . Skin: Skin is warm and dry. No rash noted. Psychiatric: sedated    LABORATORY PANEL:   CBC Recent Labs  Lab 01/08/18 0358  WBC 11.7*  HGB 9.3*  HCT 29.6*  PLT 54*   ------------------------------------------------------------------------------------------------------------------  Chemistries  Recent Labs  Lab 01/06/18 0423  01/08/18 0822  NA  --    < > 134*  K  --    < > 4.2  CL  --    < > 102  CO2  --    < > 24  GLUCOSE  --    < > 151*  BUN  --    < > 39*  CREATININE  --    < > 1.22  CALCIUM  --    < > 8.2*  MG  --    < > 2.1  AST 842*  --   --   ALT 1,886*  --   --   ALKPHOS 58  --   --   BILITOT 2.0*  --   --    < > = values in this interval not displayed.    ------------------------------------------------------------------------------------------------------------------  Cardiac Enzymes Recent Labs  Lab 01/03/18 0516 01/03/18 0959 01/03/18 1518  TROPONINI 0.04* 0.04* 0.06*   ------------------------------------------------------------------------------------------------------------------  RADIOLOGY:  Dg Chest Port 1 View  Result Date: 01/08/2018 CLINICAL DATA:  Respiratory failure EXAM: PORTABLE CHEST 1 VIEW COMPARISON:  Yesterday FINDINGS: Endotracheal tube tip just below the clavicular heads. Right IJ line with tip at the SVC. An orogastric tube reaches the stomach. Cardiopericardial enlargement, stable. Haziness of the bilateral chest attributed to layering pleural fluid. No Kerley lines or air bronchogram. Remote left clavicle fracture. IMPRESSION: 1. Stable compared to yesterday. 2. Layering pleural effusions and atelectasis. 3. Unremarkable hardware positioning. Electronically Signed   By: Marnee Spring M.D.   On: 01/08/2018 08:50   Dg Chest Port 1 View  Result Date: 01/07/2018 CLINICAL DATA:  Acute respiratory failure EXAM: PORTABLE CHEST 1 VIEW COMPARISON:  01/04/2018 FINDINGS: Cardiac shadow is stable. Endotracheal tube, nasogastric catheter and right jugular central line are again seen and stable. Right-sided pleural effusion is again identified. Mild central vascular congestion is again noted. Mild bibasilar atelectasis is again noted. IMPRESSION: Stable bibasilar atelectasis and moderate right effusion. Electronically Signed  By: Alcide CleverMark  Lukens M.D.   On: 01/07/2018 10:52     ASSESSMENT AND PLAN:   52 year old male with obesity, diabetes, hypertension came in with tachyarrhythmia and was noted to be in septic shock  1.  Septic shock with multiorgan failure. Continue vent management as per ICU Continue Zosyn  2.  Ectopic atrial tachycardia versus atrial flutter-remains tachycardic  CT Angio of the chest and pelvis  negative for any PE or aortic dissection. Echocardiogram with LV ejection fraction of 45% which is stable from prior.  3.  Acute renal failure due to ATN from septic shock and contrast exposure due to CT scans. Remains on CRRT at this time. Management per nephrology  4.  Thrombocytopenia: This is from sepsis and acute illness.    5.  Acute respiratory failure: Due to sepsis and multiorgan failure Remains on ventilator and management per ICU team Plan trial of weaning  6.  Elevated LFTs due to shock liver with multiorgan failure due to sepsis.  LFTs are improving GI consultation appreciated      Management plans discussed with dr Belia Hemankasa CODE STATUS: FULL  TOTAL TIME TAKING CARE OF THIS PATIENT: 15 minutes.     POSSIBLE D/C ??, DEPENDING ON CLINICAL CONDITION.   [name] M.D on 01/08/2018 at 12:23 PM  Between 7am to 6pm - Pager - [phone] After 6pm go to [email or web address] - password [email or web address]  [facility] [place]  Office  [phone]  CC: Primary care physician; Anola Gurneyhauvin, Robert, PA  Note: This dictation was prepared with Dragon dictation along with smaller phrase technology. Any transcriptional errors that result from this process are unintentional.

## 2018-01-08 NOTE — Progress Notes (Signed)
Nutrition Follow-up  DOCUMENTATION CODES:   Morbid obesity  INTERVENTION:  Initiate new goal regimen of Vital High Protein at 20 mL/hr (480 mL goal daily volume) + Pro-Stat 60 mL 5 times daily via OGT. Provides 1480 kcal, 192 grams of protein, 403 mL H2O daily. With current propofol rate provides 1826 kcal daily.  Provide liquid MVI daily per tube as goal TF regimen does not meet 100% RDIs for vitamins/minerals.   Continue B-complex with C daily per tube and Ocuvite daily per tube to help replace micronutrient losses on CVVHD.  NUTRITION DIAGNOSIS:   Inadequate oral intake related to inability to eat as evidenced by NPO status.  Ongoing - addressing with TF regimen.  GOAL:   Provide needs based on ASPEN/SCCM guidelines  Met with TF regimen.  MONITOR:   Vent status, Labs, Weight trends, TF tolerance, I & O's  REASON FOR ASSESSMENT:   Ventilator, Consult Enteral/tube feeding initiation and management  ASSESSMENT:   52 year old male with PMHx of DM type 2, HTN who initially presented with a few days history of abdominal discomfort and bloating, was found to be tachycardic in ER, later developed worsening respiratory distress requiring mechanical intubation on 6/22, also found to have severe sepsis with profound shock, severe lactic acidosis, acute renal failure on CVVHD, acute hepatic failure.  Patient remains intubated and sedated. In PRVC mode with FiO2 40%. Off all pressors and off Nimbex. Starting propofol today. Patient remains on CVVHD. UF up to 200 mL/hr today.  Access: 16 Fr. OGT placed 6/22; terminates in stomach per chest x-ray on 6/22  TF: pt tolerating VHP at 60 mL/hr + Pro-Stat 60 mL BID  Patient is currently intubated on ventilator support Ve: 12 L/min Temp (24hrs), Avg:98.3 F (36.8 C), Min:97.7 F (36.5 C), Max:98.8 F (37.1 C)  Propofol: 13.1 ml/hr (346 kcal daily)  Medications reviewed and include: B-complex with C QHS per tube, famotidine, Novolog  0-15 units Q4hrs, Ocuvite daily per tube, thiamine 100 mg daily per tube, argatroban gtt, fentanyl gtt off now, Zosyn, propofol gtt.   Labs reviewed: CBG 112-145 past 24 hrs, BUN 36 (trending down), Creatinine 1.22. Potassium, Phosphorus, Magnesium WNL.  I/O: only 327 mL UOP yesterday (0.1 mL/kg/hr); 2395 mL removed with UF yesterday; 1 BM yesterday  Weight trend: 145.6 kg on 6/27; +22.2 kg from admission  Discussed with RN and on rounds.  Diet Order:   Diet Order           Diet NPO time specified  Diet effective now          EDUCATION NEEDS:   No education needs have been identified at this time  Skin:  Skin Assessment: Reviewed RN Assessment  Last BM:  01/07/2018 - medium type 7  Height:   Ht Readings from Last 1 Encounters:  12/28/2017 _0  (1.753 m)    Weight:   Wt Readings from Last 1 Encounters:  01/08/18 (!) 320 lb 15.8 oz (145.6 kg)    Ideal Body Weight:  72.7 kg  BMI:  Body mass index is 47.4 kg/m.  Estimated Nutritional Needs:   Kcal:  1357-1728 (11-14 kcal/kg)  Protein:  182 grams (2.5 grams/kg IBW)  Fluid:  1.8-2.2 L/day (25-30 mL/kg IBW)  Willey Blade, MS, RD, LDN Office: [phone] Pager: [phone] After Hours/Weekend Pager: [phone]

## 2018-01-08 NOTE — Progress Notes (Signed)
[facility]  ROUNDING NOTE   Subjective:   CRRT UOP 327 UF 2395  Wife at bedside.   Argotroban gtt  Objective:  Vital signs in last 24 hours:  Temp:  [97.7 F (36.5 C)-98.8 F (37.1 C)] 98.8 F (37.1 C) (06/27 0800) Pulse Rate:  [119-125] 123 (06/27 0800) Resp:  [23-25] 25 (06/27 0800) BP: (91-110)/(64-78) 98/73 (06/27 0800) SpO2:  [93 %-98 %] 95 % (06/27 0800) FiO2 (%):  [40 %] 40 % (06/27 0743) Weight:  [145.6 kg (320 lb 15.8 oz)] 145.6 kg (320 lb 15.8 oz) (06/27 0100)  Weight change: 2.6 kg (5 lb 11.7 oz) Filed Weights   01/06/18 0100 01/07/18 0145 01/08/18 0100  Weight: (!) 142.8 kg (314 lb 13.1 oz) (!) 143 kg (315 lb 4.1 oz) (!) 145.6 kg (320 lb 15.8 oz)    Intake/Output: I/O last 3 completed shifts: In: 4879.8 [I.V.:1968.2; NG/GT:2160; IV Piggyback:751.6] Out: 3501 [Urine:512; Emesis/NG output:15; IDPOE:4235; Stool:1]   Intake/Output this shift:  Total I/O In: 312 [I.V.:92; NG/GT:120; IV Piggyback:100] Out: 40 [Urine:40]  Physical Exam: General: Critically ill  Head: ETT, OGT  Eyes: Scleral + edema  Neck: trachea midline  Lungs:  PRVC 40%  Heart: tachycardia  Abdomen:  Soft, nontender, bowel sounds    Extremities: +edema  Neurologic: Intubated and sedated  Skin: Mottled appearance  Access: Right temporary femoral dialysis catheter    Basic Metabolic Panel: Recent Labs  Lab 01/07/18 1141 01/07/18 1600 01/07/18 1938 01/08/18 0001 01/08/18 0342 01/08/18 0822  NA 135 136 134* 134* 134* 134*  K 4.3 3.9 4.2 4.1 4.2 4.2  CL 104 104 106 103 103 102  CO2 24 25 22 22  21* 24  GLUCOSE 149* 131* 149* 148* 165* 151*  BUN 35* 35* 37* 39* 40* 39*  CREATININE 1.25* 1.13 1.20 1.37* 1.20 1.22  CALCIUM 8.0* 8.1* 8.2* 8.2* 8.1* 8.2*  MG 2.0 2.1 2.0 2.1 2.2  --   PHOS 3.2 2.9 3.0 3.4 3.1 2.9    Liver Function Tests: Recent Labs  Lab 01/03/18 0516 01/03/18 0959  01/05/18 0530  01/06/18 0423  01/07/18 1600 01/07/18 1938 01/08/18 0001  01/08/18 0342 01/08/18 0822  AST 240* 498*  --  1,984*  --  842*  --   --   --   --   --   --   ALT 217* 404*  --  2,352*  --  1,886*  --   --   --   --   --   --   ALKPHOS 62 54  --  57  --  58  --   --   --   --   --   --   BILITOT 2.5* 4.0*  --  2.2*  --  2.0*  --   --   --   --   --   --   PROT 6.7 5.5*  --  4.6*  --  4.8*  --   --   --   --   --   --   ALBUMIN 3.6 2.9*   < > 2.4*  2.5*   < > 2.4*   < > 3.8 3.3* 3.8 3.6 3.9   < > = values in this interval not displayed.   Recent Labs  Lab 01/03/18 1044  LIPASE 52*  AMYLASE 45   Recent Labs  Lab 01/03/18 1518  AMMONIA 64*    CBC: Recent Labs  Lab 01/03/18 0959 01/04/18 0415 01/05/18 0530 01/07/18 0334  01/08/18 0358  WBC 14.1* 21.8* 12.6* 9.9 11.7*  NEUTROABS  --   --  10.9* 8.2*  --   HGB 11.0* 10.0* 9.5* 9.4* 9.3*  HCT 33.4* 30.3* 28.4* 29.0* 29.6*  MCV 101.2* 98.6 97.8 102.0* 103.7*  PLT 213 96* 84* 41* 54*    Cardiac Enzymes: Recent Labs  Lab 01/03/2018 1152 01/03/18 0516 01/03/18 0959 01/03/18 1518  CKTOTAL  --  180  --   --   TROPONINI 0.04* 0.04* 0.04* 0.06*    BNP: Invalid input(s): POCBNP  CBG: Recent Labs  Lab 01/07/18 1553 01/07/18 1946 01/08/18 0026 01/08/18 0419 01/08/18 0759  GLUCAP 116* 112* 124* 145* 130*    Microbiology: Results for orders placed or performed during the hospital encounter of 01/05/2018  Culture, blood (routine x 2)     Status: None   Collection Time: 01/03/18 10:42 AM  Result Value Ref Range Status   Specimen Description BLOOD LT [facility]  Final   Special Requests   Final    BOTTLES DRAWN AEROBIC AND ANAEROBIC Blood Culture adequate volume   Culture   Final    NO GROWTH 5 DAYS Performed at [facility], [address] [place], [place] [address]    Report Status 01/08/2018 FINAL  Final  MRSA PCR Screening     Status: None   Collection Time: 01/03/18 10:56 AM  Result Value Ref Range Status   MRSA by PCR NEGATIVE NEGATIVE Final    Comment:        The  GeneXpert MRSA Assay (FDA approved for NASAL specimens only), is one component of a comprehensive MRSA colonization surveillance program. It is not intended to diagnose MRSA infection nor to guide or monitor treatment for MRSA infections. Performed at [facility], [address] [place], [place] [address]   CULTURE, BLOOD (ROUTINE X 2) w Reflex to ID Panel     Status: None   Collection Time: 01/03/18  1:25 PM  Result Value Ref Range Status   Specimen Description BLOOD LINE  Final   Special Requests   Final    BOTTLES DRAWN AEROBIC AND ANAEROBIC Blood Culture adequate volume   Culture   Final    NO GROWTH 5 DAYS Performed at [facility], [address], [place], [place] [address]    Report Status 01/08/2018 FINAL  Final  Culture, Urine     Status: None   Collection Time: 01/03/18  4:49 PM  Result Value Ref Range Status   Specimen Description   Final    URINE, RANDOM Performed at [facility], [address], [place], [place] [address]    Special Requests   Final    NONE Performed at [facility], [address] [place], [place] [address]    Culture   Final    NO GROWTH Performed at [facility], [place] [address] [place], [place] [address]    Report Status 01/04/2018 FINAL  Final  Culture, expectorated sputum-assessment     Status: None   Collection Time: 01/03/18  5:53 PM  Result Value Ref Range Status   Specimen Description TRACHEAL ASPIRATE  Final   Special Requests NONE  Final   Sputum evaluation   Final    THIS SPECIMEN IS ACCEPTABLE FOR SPUTUM CULTURE Performed at [facility], [address] [place], [place] [address]    Report Status 01/03/2018 FINAL  Final  Culture, respiratory (NON-Expectorated)     Status: None   Collection Time: 01/03/18  5:53 PM  Result Value Ref Range Status   Specimen Description   Final    TRACHEAL ASPIRATE Performed at [facility], [address]  [place], [place] [address]    Special Requests   Final    NONE Reflexed from [id number] Performed at [facility], [address] [place], [place] [address]    Gram Stain   Final    FEW WBC PRESENT, PREDOMINANTLY PMN RARE SQUAMOUS EPITHELIAL CELLS PRESENT FEW GRAM POSITIVE COCCI IN CLUSTERS FEW GRAM NEGATIVE COCCOBACILLI    Culture   Final    Consistent with normal respiratory flora. Performed at [facility], [place] [address] [place], [place] [address]    Report Status 01/06/2018 FINAL  Final    Coagulation Studies: No results for input(s): LABPROT, INR in the last 72 hours.  Urinalysis: No results for input(s): COLORURINE, LABSPEC, PHURINE, GLUCOSEU, HGBUR, BILIRUBINUR, KETONESUR, PROTEINUR, UROBILINOGEN, NITRITE, LEUKOCYTESUR in the last 72 hours.  Invalid input(s): APPERANCEUR    Imaging: Dg Chest Port 1 View  Result Date: 01/07/2018 CLINICAL DATA:  Acute respiratory failure EXAM: PORTABLE CHEST 1 VIEW COMPARISON:  01/04/2018 FINDINGS: Cardiac shadow is stable. Endotracheal tube, nasogastric catheter and right jugular central line are again seen and stable. Right-sided pleural effusion is again identified. Mild central vascular congestion is again noted. Mild bibasilar atelectasis is again noted. IMPRESSION: Stable bibasilar atelectasis and moderate right effusion. Electronically Signed   By: Inez Catalina M.D.   On: 01/07/2018 10:52     Medications:   . sodium chloride    . sodium chloride    . albumin human 60 mL/hr at 01/08/18 0600  . anticoagulant sodium citrate    . argatroban 0.5 mcg/kg/min (01/08/18 0600)  . cisatracurium (NIMBEX) infusion Stopped (01/07/18 1722)  . feeding supplement (VITAL HIGH PROTEIN) 60 mL/hr at 01/08/18 0600  . fentaNYL infusion INTRAVENOUS Stopped (01/08/18 0847)  . midazolam (VERSED) infusion 8 mg/hr (01/08/18 0600)  . piperacillin-tazobactam (ZOSYN)  IV 12.5 mL/hr at 01/08/18 0600  . pureflow 2,500 mL/hr at 01/08/18 0314   .  B-complex with vitamin C  1 tablet Per Tube QHS  . chlorhexidine gluconate (MEDLINE KIT)  15 mL Mouth Rinse BID  . famotidine  20 mg Per Tube BID  . feeding supplement (PRO-STAT SUGAR FREE 64)  60 mL Per Tube BID  . insulin aspart  0-15 Units Subcutaneous Q4H  . ipratropium-albuterol  3 mL Nebulization Q6H  . mouth rinse  15 mL Mouth Rinse 10 times per day  . multivitamin-lutein  1 capsule Per Tube Daily  . polyvinyl alcohol  1 drop Both Eyes TID  . sodium chloride flush  3 mL Intravenous Q12H  . sodium chloride flush  3 mL Intravenous Q12H  . sterile water (preservative free)      . thiamine  100 mg Per Tube Daily   sodium chloride, sodium chloride, acetaminophen **OR** acetaminophen, anticoagulant sodium citrate, iopamidol, [DISCONTINUED] ondansetron **OR** ondansetron (ZOFRAN) IV, sodium chloride flush, sodium chloride flush, vecuronium  Assessment/ Plan:  Ryan Webb is a 52 y.o. white male with diabetes mellitus type 2, hypertension, chronic systolic heart failure ejection fraction 45%, who was admitted to [facility] on 12/29/2017 for evaluation of abdominal discomfort and bloating.   Patient had decompensation on January 03, 2018 with severe hypotension requiring multiple pressors, acute respiratory failure, shock liver and acute renal failure.  1.  Acute renal failure secondary to severe hypotension, and also exposed to contrast with CTA. Baseline  careatinine 1.14, 12/12/16 2.  Hyperkalemia. 3.  Metabolic acidosis with elevated lactic acid level, pt was on metformin and TCAs as outpt.  4.  Acute respiratory failure. 5.  Elevated liver enzymes. 6. Hyponatremia 7. Hypoalbuminemia.  8. Thrombocytopenia - on HIT protocol  Plan:  Continue CRRT. Increase ultrafiltration to help with volume status.  Oliguric urine output - continue to monitor.  Discontinue IV albumin.    LOS: 5 Laquan Beier 6/27/20198:48 AM

## 2018-01-08 NOTE — Progress Notes (Signed)
ANTICOAGULATION CONSULT NOTE   Pharmacy Consult for Argatroban Dosing  Indication: atrial fibrillation  Allergies  Allergen Reactions  . Heparin     Possible HIT 6/26    Patient Measurements: Height: 5\' 9"  (175.3 cm) Weight: (!) 320 lb 15.8 oz (145.6 kg) IBW/kg (Calculated) : 70.7 Heparin Dosing Weight: 98.9 kg  Vital Signs: Temp: 98.6 F (37 C) (06/27 0400) Temp Source: Oral (06/27 0400) BP: 98/74 (06/27 0500) Pulse Rate: 124 (06/27 0500)  Labs: Recent Labs    01/05/18 0530  01/06/18 0423  01/07/18 0334  01/07/18 1329 01/07/18 1600 01/07/18 1938 01/08/18 0001 01/08/18 0342 01/08/18 0358  HGB 9.5*  --   --   --  9.4*  --   --   --   --   --   --  9.3*  HCT 28.4*  --   --   --  29.0*  --   --   --   --   --   --  29.6*  PLT 84*  --   --   --  41*  --   --   --   --   --   --  54*  APTT  --   --   --   --   --   --  89* 67*  --   --  83*  --   LABPROT 25.4*  --   --   --   --   --   --   --   --   --   --   --   INR 2.33  --   --   --   --   --   --   --   --   --   --   --   HEPARINUNFRC 0.52  --  0.52  --  0.40  --   --   --   --   --   --   --   CREATININE 1.34*   < >  --    < > 1.22   < >  --  1.13 1.20 1.37* 1.20  --    < > = values in this interval not displayed.    Estimated Creatinine Clearance: 103.7 mL/min (by C-G formula based on SCr of 1.2 mg/dL).   Assessment: Pharmacy consulted for argatroban dosing for 52 yo male ICU patient currently requiring mechanical ventilation and CRRT. Patient previously on Heparin drip for atrial fibrillation.   Goal of Therapy:  APTT 50-90 Monitor platelets by anticoagulation protocol: Yes   Plan:  Patient being worked up for possible HIT. Platelets are currently 41 and HIT score is 5. Heparin Serotonin Assay ordered.   Argatroban initiated at 0.815mcg/kg/min using adjusted body weight. Second aPTT is within target range. Will obtain follow up aPTT with am labs.   Pharmacy will continue to monitor and adjust per  consult.   6/27:  APTT @ 0342 = 83 Will continue this pt on current rate and recheck aPTT on 6/28 with AM labs.   Aaliyah Gavel D 01/08/2018

## 2018-01-08 NOTE — Progress Notes (Signed)
Attempted to bathe patient.  Hyperoxygenated prior to start.  Pt became acutely hypoxic with low of 58%.  Pt removed from vent and bagged until 92%.  Pt administered Vecuronium 10mg  IVP per order.  Pt tolerated the completion of the bath.  Current sat is 100% - vent set at 40%.  Notified Blakeney, NP.

## 2018-01-08 NOTE — Progress Notes (Signed)
0235 mouth care completed as ordered.  Pt's sats dropped into the 80s.  Pt peak pressuring the ventilator and biting ETT.  Vecuronium administered per order.

## 2018-01-08 NOTE — Progress Notes (Signed)
   01/08/18 1500  Clinical Encounter Type  Visited With Patient and family together  Visit Type Follow-up  Spiritual Encounters  Spiritual Needs Emotional;Prayer   Spouse reported getting news that wasn't as 'positive' as she had hoped for.  Spouse became tearful, sharing the difficulty of the waiting and the unknown.  Chaplain utilized active and reflective listening to assist spouse in processing feelings and sharing stories from her life with patient.  Spouse requested prayer which chaplain led.  Chaplain encouraged spouse to have staff page chaplain as needed.

## 2018-01-08 NOTE — Progress Notes (Signed)
2330 Pillows removed from bed. Pt O2 sat from 93% to 66%.  Pt biting on tube.  Saturation continued to decline - pt removed from vent to BVM.  Administered Vecuronium 10mg  IVP.  Pt recovered O2 Sats and placed back onto ventilator at previous settings.

## 2018-01-08 NOTE — Progress Notes (Signed)
CRITICAL CARE NOTE  CC  follow up severe respiratory failure  SUBJECTIVE Patient remains critically ill Prognosis is guarded Weaned off paralytics Attempt sedation vacation with wife at bedside     [place]: 6/22 ECHO-EF 45-50%  CULTURES: MRSA PCR- Neg  ANTIBIOTICS: 6/22Zosyn; Vancomycin 6/24 Vancomycin D/C  SIGNIFICANT EVENTS: 6/21 admission to hospital 6/22 transfer to ICU,    LINES/TUBES: 6/22 Foley 6/22 R-IJ TLC, R- Femoral hemodialysis cath 6/22 L- Femoral Arterial line 6/22 Endotracheal tube 6/26 failed wean off paralytics     BP 95/75   Pulse (!) 123   Temp 98.6 F (37 C) (Oral)   Resp (!) 25   Ht _0  (1.753 m)   Wt (!) 320 lb 15.8 oz (145.6 kg)   SpO2 93%   BMI 47.40 kg/m    REVIEW OF SYSTEMS  PATIENT IS UNABLE TO PROVIDE COMPLETE REVIEW OF SYSTEM S DUE TO SEVERE CRITICAL ILLNESS AND ENCEPHALOPATHY   PHYSICAL EXAMINATION:  GENERAL:critically ill appearing, +resp distress HEAD: Normocephalic, atraumatic.  EYES: Pupils equal, round, reactive to light.  No scleral icterus.  MOUTH: Moist mucosal membrane. NECK: Supple. No thyromegaly. No nodules. No JVD.  PULMONARY: +rhonchi, +wheezing CARDIOVASCULAR: S1 and S2. Regular rate and rhythm. No murmurs, rubs, or gallops.  GASTROINTESTINAL: Soft, nontender, -distended. No masses. Positive bowel sounds. No hepatosplenomegaly.  MUSCULOSKELETAL: No swelling, clubbing, or edema.  NEUROLOGIC: obtunded, GCS<8 SKIN:intact,warm,dry  ASSESSMENT AND PLAN 52 yo morbidly obese WM with severe resp failure and renal failure on CRRT from probable acute viral syndrome with severe acidosis and progressive resp and renal failure   Severe Hypoxic and Hypercapnic Respiratory Failure -continue Full MV support -continue Bronchodilator Therapy -Wean Fio2 and PEEP as tolerated -will perform SAT/SBt when respiratory parameters are met   Renal Failure-most likely  due to ATN -follow chem 7 -follow UO -continue Foley Catheter-assess need On CRRT follow up nephrology recs  NEUROLOGY -stop all sedatives  Septic shock -use vasopressors to keep MAP>65 if needed  CARDIAC ICU monitoring  ID -follow up cultures   DVT/GI PRX ordered TRANSFUSIONS AS NEEDED MONITOR FSBS ASSESS the need for LABS as needed   Critical Care Time devoted to patient care services described in this note is 32 minutes.   Overall, patient is critically ill, prognosis is guarded.  Patient with Multiorgan failure and at high risk for cardiac arrest and death.    Corrin Parker, M.D.  [facility]  Medical Director [place] Director [facility] Cardio-Pulmonary Department

## 2018-01-08 NOTE — Progress Notes (Signed)
CRRT filter expired at 2315.  Rinse back completed.   CRRT restarted at 0107.

## 2018-01-09 ENCOUNTER — Inpatient Hospital Stay: Payer: BLUE CROSS/BLUE SHIELD

## 2018-01-09 DIAGNOSIS — J9602 Acute respiratory failure with hypercapnia: Secondary | ICD-10-CM

## 2018-01-09 DIAGNOSIS — J9601 Acute respiratory failure with hypoxia: Secondary | ICD-10-CM

## 2018-01-09 LAB — RENAL FUNCTION PANEL
Albumin: 3.4 g/dL — ABNORMAL LOW (ref 3.5–5.0)
Albumin: 3.4 g/dL — ABNORMAL LOW (ref 3.5–5.0)
Albumin: 3.6 g/dL (ref 3.5–5.0)
Anion gap: 7 (ref 5–15)
Anion gap: 8 (ref 5–15)
Anion gap: 9 (ref 5–15)
BUN: 37 mg/dL — ABNORMAL HIGH (ref 6–20)
BUN: 38 mg/dL — ABNORMAL HIGH (ref 6–20)
BUN: 39 mg/dL — AB (ref 6–20)
CALCIUM: 8.5 mg/dL — AB (ref 8.9–10.3)
CHLORIDE: 104 mmol/L (ref 98–111)
CHLORIDE: 105 mmol/L (ref 98–111)
CO2: 21 mmol/L — ABNORMAL LOW (ref 22–32)
CO2: 22 mmol/L (ref 22–32)
CO2: 24 mmol/L (ref 22–32)
CREATININE: 1.18 mg/dL (ref 0.61–1.24)
CREATININE: 1.07 mg/dL (ref 0.61–1.24)
CREATININE: 1.18 mg/dL (ref 0.61–1.24)
Calcium: 8.1 mg/dL — ABNORMAL LOW (ref 8.9–10.3)
Calcium: 8.3 mg/dL — ABNORMAL LOW (ref 8.9–10.3)
Chloride: 103 mmol/L (ref 98–111)
GFR calc Af Amer: >60 mL/min (ref >60)
GFR calc Af Amer: >60 mL/min (ref >60)
GFR calc non Af Amer: >60 mL/min (ref >60)
GLUCOSE: 152 mg/dL — AB (ref 70–99)
Glucose, Bld: 141 mg/dL — ABNORMAL HIGH (ref 70–99)
Glucose, Bld: 169 mg/dL — ABNORMAL HIGH (ref 70–99)
PHOSPHORUS: 2.5 mg/dL (ref 2.5–4.6)
POTASSIUM: 4.2 mmol/L (ref 3.5–5.1)
POTASSIUM: 4.2 mmol/L (ref 3.5–5.1)
Phosphorus: 3.5 mg/dL (ref 2.5–4.6)
Phosphorus: 2.9 mg/dL (ref 2.5–4.6)
Potassium: 4.5 mmol/L (ref 3.5–5.1)
SODIUM: 134 mmol/L — AB (ref 135–145)
Sodium: 134 mmol/L — ABNORMAL LOW (ref 135–145)
Sodium: 135 mmol/L (ref 135–145)

## 2018-01-09 LAB — DIFFERENTIAL
BAND NEUTROPHILS: 9 %
Basophils Absolute: 0 10*3/uL (ref 0–0.1)
Basophils Relative: 0 %
Blasts: 0 %
EOS PCT: 2 %
Eosinophils Absolute: 0.3 10*3/uL (ref 0–0.7)
Lymphocytes Relative: 7 %
Lymphs Abs: 0.9 10*3/uL — ABNORMAL LOW (ref 1.0–3.6)
METAMYELOCYTES PCT: 11 %
MONOS PCT: 13 %
Monocytes Absolute: 1.7 10*3/uL — ABNORMAL HIGH (ref 0.2–1.0)
Myelocytes: 1 %
NRBC: 3 /100{WBCs} — AB
Neutro Abs: 10.5 10*3/uL — ABNORMAL HIGH (ref 1.4–6.5)
Neutrophils Relative %: 57 %
Other: 0 %
Promyelocytes Relative: 0 %

## 2018-01-09 LAB — MAGNESIUM
Magnesium: 2 mg/dL (ref 1.7–2.4)
Magnesium: 2.1 mg/dL (ref 1.7–2.4)
Magnesium: 2.2 mg/dL (ref 1.7–2.4)

## 2018-01-09 LAB — CBC
HCT: 31.1 % — ABNORMAL LOW (ref 40.0–52.0)
HEMATOCRIT: 31.9 % — AB (ref 40.0–52.0)
HEMOGLOBIN: 9.9 g/dL — AB (ref 13.0–18.0)
Hemoglobin: 10.1 g/dL — ABNORMAL LOW (ref 13.0–18.0)
MCH: 32.8 pg (ref 26.0–34.0)
MCH: 33 pg (ref 26.0–34.0)
MCHC: 31.9 g/dL — ABNORMAL LOW (ref 32.0–36.0)
MCHC: 31.5 g/dL — ABNORMAL LOW (ref 32.0–36.0)
MCV: 103.1 fL — ABNORMAL HIGH (ref 80.0–100.0)
MCV: 104.6 fL — AB (ref 80.0–100.0)
PLATELETS: 67 10*3/uL — AB (ref 150–440)
PLATELETS: 76 10*3/uL — AB (ref 150–440)
RBC: 3.02 MIL/uL — AB (ref 4.40–5.90)
RBC: 3.05 MIL/uL — ABNORMAL LOW (ref 4.40–5.90)
RDW: 17.9 % — ABNORMAL HIGH (ref 11.5–14.5)
RDW: 18.4 % — AB (ref 11.5–14.5)
WBC: 10.5 10*3/uL (ref 3.8–10.6)
WBC: 13.4 10*3/uL — AB (ref 3.8–10.6)

## 2018-01-09 LAB — GLUCOSE, CAPILLARY
GLUCOSE-CAPILLARY: 138 mg/dL — AB (ref 70–99)
GLUCOSE-CAPILLARY: 138 mg/dL — AB (ref 70–99)
Glucose-Capillary: 100 mg/dL — ABNORMAL HIGH (ref 70–99)
Glucose-Capillary: 129 mg/dL — ABNORMAL HIGH (ref 70–99)
Glucose-Capillary: 153 mg/dL — ABNORMAL HIGH (ref 70–99)

## 2018-01-09 LAB — BLOOD GAS, ARTERIAL
Acid-base deficit: 1 mmol/L (ref 0.0–2.0)
Bicarbonate: 24.8 mmol/L (ref 20.0–28.0)
FIO2: 0.4
MECHANICAL RATE: 25
MECHVT: 500 mL
O2 SAT: 99.1 %
PATIENT TEMPERATURE: 37
PCO2 ART: 45 mmHg (ref 32.0–48.0)
PEEP: 5 cmH2O
PH ART: 7.35 (ref 7.350–7.450)
PO2 ART: 141 mmHg — AB (ref 83.0–108.0)
RATE: 25 resp/min

## 2018-01-09 LAB — BENZODIAZEPINES,MS,WB/SP RFX
7-AMINOCLONAZEPAM: NEGATIVE ng/mL
ALPRAZOLAM: NEGATIVE ng/mL
Benzodiazepines Confirm: POSITIVE
CLONAZEPAM: NEGATIVE ng/mL
Chlordiazepoxide: NEGATIVE ng/mL
DESALKYLFLURAZEPAM: NEGATIVE ng/mL
Desmethylchlordiazepoxide: NEGATIVE ng/mL
Desmethyldiazepam: NEGATIVE ng/mL
Diazepam: 15 ng/mL
FLURAZEPAM: NEGATIVE ng/mL
Lorazepam: NEGATIVE ng/mL
MIDAZOLAM: 60 ng/mL
Oxazepam: NEGATIVE ng/mL
Temazepam: NEGATIVE ng/mL
Triazolam: NEGATIVE ng/mL

## 2018-01-09 LAB — BASIC METABOLIC PANEL
ANION GAP: 9 (ref 5–15)
BUN: 44 mg/dL — ABNORMAL HIGH (ref 6–20)
CALCIUM: 8.3 mg/dL — AB (ref 8.9–10.3)
CO2: 23 mmol/L (ref 22–32)
Chloride: 105 mmol/L (ref 98–111)
Creatinine, Ser: 1.44 mg/dL — ABNORMAL HIGH (ref 0.61–1.24)
GFR, EST NON AFRICAN AMERICAN: 55 mL/min — AB (ref >60)
Glucose, Bld: 141 mg/dL — ABNORMAL HIGH (ref 70–99)
Potassium: 4.2 mmol/L (ref 3.5–5.1)
Sodium: 137 mmol/L (ref 135–145)

## 2018-01-09 LAB — APTT: APTT: 88 s — AB (ref 24–36)

## 2018-01-09 LAB — PHOSPHORUS: PHOSPHORUS: 3.6 mg/dL (ref 2.5–4.6)

## 2018-01-09 LAB — CALCIUM, IONIZED: Calcium, Ionized, Serum: 4.5 mg/dL (ref 4.5–5.6)

## 2018-01-09 LAB — HEPARIN LEVEL (UNFRACTIONATED): Heparin Unfractionated: 0.3 IU/mL (ref 0.30–0.70)

## 2018-01-09 MED ORDER — STERILE WATER FOR INJECTION IJ SOLN
INTRAMUSCULAR | Status: AC
  Administered 2018-01-09: 10 mL
  Filled 2018-01-09: qty 10

## 2018-01-09 MED ORDER — CHLORHEXIDINE GLUCONATE CLOTH 2 % EX PADS
6.0000 | MEDICATED_PAD | Freq: Every day | CUTANEOUS | Status: DC
  Administered 2018-01-11 – 2018-01-20 (×7): 6 via TOPICAL

## 2018-01-09 MED ORDER — VECURONIUM BROMIDE 10 MG IV SOLR
10.0000 mg | Freq: Once | INTRAVENOUS | Status: AC
  Administered 2018-01-09: 10 mg via INTRAVENOUS

## 2018-01-09 MED ORDER — HEPARIN (PORCINE) IN NACL 100-0.45 UNIT/ML-% IJ SOLN
1150.0000 [IU]/h | INTRAMUSCULAR | Status: DC
  Administered 2018-01-09 – 2018-01-12 (×6): 1700 [IU]/h via INTRAVENOUS
  Administered 2018-01-13: 1600 [IU]/h via INTRAVENOUS
  Administered 2018-01-14: 1150 [IU]/h via INTRAVENOUS
  Administered 2018-01-15 – 2018-01-17 (×4): 1050 [IU]/h via INTRAVENOUS
  Filled 2018-01-09 (×12): qty 250

## 2018-01-09 MED ORDER — TUBERCULIN PPD 5 UNIT/0.1ML ID SOLN
5.0000 [IU] | Freq: Once | INTRADERMAL | Status: AC
  Administered 2018-01-09: 5 [IU] via INTRADERMAL
  Filled 2018-01-09: qty 0.1

## 2018-01-09 MED ORDER — VECURONIUM BROMIDE 10 MG IV SOLR
INTRAVENOUS | Status: AC
  Administered 2018-01-09: 10 mg via INTRAVENOUS
  Filled 2018-01-09: qty 10

## 2018-01-09 NOTE — Progress Notes (Signed)
ANTICOAGULATION CONSULT NOTE - Follow Up Consult  Pharmacy Consult for Heparin Drip Indication: atrial fibrillation  Allergies  Allergen Reactions  . No Known Allergies     Patient Measurements: Height: 5\' 9"  (175.3 cm) Weight: (!) 324 lb 11.8 oz (147.3 kg) IBW/kg (Calculated) : 70.7 Heparin Dosing Weight: 98.9 kg  Vital Signs: Temp: 98.9 F (37.2 C) (06/28 2000) Temp Source: Axillary (06/28 2000) BP: 107/72 (06/28 2100) Pulse Rate: 132 (06/28 2138)  Labs: Recent Labs    01/07/18 0334  01/07/18 1600  01/08/18 0342 01/08/18 0358  01/08/18 1223  01/08/18 2334 01/09/18 0342 01/09/18 0751 01/09/18 0945 01/09/18 1758  HGB 9.4*  --   --   --   --  9.3*  --   --   --   --  9.9*  --  10.1*  --   HCT 29.0*  --   --   --   --  29.6*  --   --   --   --  31.1*  --  31.9*  --   PLT 41*  --   --   --   --  54*  --   --   --   --  67*  --  76*  --   APTT  --    < > 67*  --  83*  --   --   --   --   --  88*  --   --   --   LABPROT  --   --   --   --   --   --   --  37.8*  --   --   --   --   --   --   INR  --   --   --   --   --   --   --  3.88  --   --   --   --   --   --   HEPARINUNFRC 0.40  --   --   --   --   --   --   --   --   --   --   --   --  0.30  CREATININE 1.22   < > 1.13   < > 1.20  --    < > 1.22  1.29*   < > 1.07 1.18 1.18  --   --    < > = values in this interval not displayed.    Estimated Creatinine Clearance: 106.1 mL/min (by C-G formula based on SCr of 1.18 mg/dL).   Assessment: Patient transitioned over to Heparin drip today. Currently infusing at 1700 units/hr.  6/28 1800 Heparin level resulted at 0.30  Goal of Therapy:  Heparin level 0.3-0.7 units/ml Monitor platelets by anticoagulation protocol: Yes   Plan:  Will continue with current rate and check a confirmatory level in 6 hours.  Clovia CuffLisa Kimra Kantor, PharmD, BCPS 01/09/2018 9:45 PM

## 2018-01-09 NOTE — Progress Notes (Signed)
[facility]   PATIENT NAME: Ryan Webb    MR#:  [id number]  DATE OF BIRTH:  January 28, 1966  SUBJECTIVE:   Patient remains critically ill.  Plan to discontinue CRRT and try intermittent hemodialysis.  REVIEW OF SYSTEMS:    unAble to obtain  tolerating Diet: Tube feeds        DRUG ALLERGIES:   Allergies  Allergen Reactions  . Heparin     Possible HIT 6/26    VITALS:  Blood pressure 99/82, pulse (!) 126, temperature 97.9 F (36.6 C), temperature source Oral, resp. rate (!) 25, height 5\' 9"  (1.753 m), weight (!) 145.6 kg (320 lb 15.8 oz), SpO2 97 %.  PHYSICAL EXAMINATION:  Constitutional: Appears obese sedated on vent HENT: Normocephalic.  Intubated Eyes: , no scleral icterus.  Neck: Normal ROM. Neck supple. No JVD. No tracheal deviation. CVS: Tachycardic S1/S2 +, no murmurs, no gallops, no carotid bruit.  Pulmonary: Effort and breath sounds normal, no stridor, rhonchi, wheezes, rales.  Abdominal: Soft. BS +,  no distension, tenderness, rebound or guarding.  Musculoskeletal: sedated Neuro:sedated     . Skin: Skin is warm and dry. No rash noted.  2+ lower extremity edema Psychiatric: sedated    LABORATORY PANEL:   CBC Recent Labs  Lab 01/09/18 0342  WBC 10.5  HGB 9.9*  HCT 31.1*  PLT 67*   ------------------------------------------------------------------------------------------------------------------  Chemistries  Recent Labs  Lab 01/08/18 1223  01/09/18 0342 01/09/18 0751  NA 136  135   < > 134* 135  K 4.7  4.6   < > 4.5 4.2  CL 103  102   < > 103 104  CO2 24  25   < > 22 24  GLUCOSE 122*  123*   < > 152* 141*  BUN 36*  38*   < > 39* 38*  CREATININE 1.22  1.29*   < > 1.18 1.18  CALCIUM 8.6*  8.5*   < > 8.5* 8.3*  MG 2.0   < > 2.1  --   AST 134*  --   --   --   ALT 833*  --   --   --   ALKPHOS 102  --   --   --   BILITOT 1.8*  --   --   --    < > = values in this interval not displayed.    ------------------------------------------------------------------------------------------------------------------  Cardiac Enzymes Recent Labs  Lab 01/03/18 0516 01/03/18 0959 01/03/18 1518  TROPONINI 0.04* 0.04* 0.06*   ------------------------------------------------------------------------------------------------------------------  RADIOLOGY:  Dg Chest Port 1 View  Result Date: 01/08/2018 CLINICAL DATA:  Respiratory failure EXAM: PORTABLE CHEST 1 VIEW COMPARISON:  Yesterday FINDINGS: Endotracheal tube tip just below the clavicular heads. Right IJ line with tip at the SVC. An orogastric tube reaches the stomach. Cardiopericardial enlargement, stable. Haziness of the bilateral chest attributed to layering pleural fluid. No Kerley lines or air bronchogram. Remote left clavicle fracture. IMPRESSION: 1. Stable compared to yesterday. 2. Layering pleural effusions and atelectasis. 3. Unremarkable hardware positioning. Electronically Signed   By: Marnee Spring M.D.   On: 01/08/2018 08:50     ASSESSMENT AND PLAN:   52 year old male with obesity, diabetes, hypertension came in with tachyarrhythmia and was noted to be in septic shock  1.  Septic shock with multiorgan failure. Continue vent management as per ICU Continue Zosyn  2.  Ectopic atrial tachycardia versus atrial flutter-remains tachycardic  CT Angio of the chest and pelvis  negative for any PE or aortic dissection. Echocardiogram with LV ejection fraction of 45% which is stable from prior. Continue Argatroban due to possible HIT and low platelets.   3.  Acute renal failure due to ATN from septic shock and contrast exposure due to CT scans. Remains on CRRT at this time. Management per nephrology  4.  Thrombocytopenia/HIT due to sepsis: Platelet count is increasing.   5.  Acute respiratory failure: Due to sepsis and multiorgan failure Remains on ventilator and management per ICU team   6.  Elevated LFTs due to shock  liver with multiorgan failure due to sepsis.  LFTs are improving GI consultation appreciated  7.  Nutrition: Continue tube feeds Overall prognosis guarded.  Patient is critically ill.   CODE STATUS: FULL  TOTAL TIME TAKING CARE OF THIS PATIENT: 15 minutes.     POSSIBLE D/C ??, DEPENDING ON CLINICAL CONDITION.   Shaunta Oncale M.D on 01/09/2018 at 10:33 AM  Between 7am to 6pm - Pager - [phone] After 6pm go to [email or web address] - password [email or web address]  [facility]  Office  [phone]  CC: Primary care physician; Anola Gurneyhauvin, Robert, PA  Note: This dictation was prepared with Dragon dictation along with smaller phrase technology. Any transcriptional errors that result from this process are unintentional.

## 2018-01-09 NOTE — Progress Notes (Signed)
[facility]  ROUNDING NOTE   Subjective:   CRRT - off vasopressors. UF 5193 - Net  -[id number]  Argotroban gtt  Objective:  Vital signs in last 24 hours:  Temp:  [97.6 F (36.4 C)-98.8 F (37.1 C)] 97.6 F (36.4 C) (06/28 0400) Pulse Rate:  [123-126] 124 (06/28 0700) Resp:  [17-25] 25 (06/28 0700) BP: (93-116)/(68-81) 95/71 (06/28 0700) SpO2:  [85 %-100 %] 99 % (06/28 0700) FiO2 (%):  [40 %] 40 % (06/28 0400) Weight:  [145.6 kg (320 lb 15.8 oz)] 145.6 kg (320 lb 15.8 oz) (06/28 0023)  Weight change: 0 kg (0 lb) Filed Weights   01/07/18 0145 01/08/18 0100 01/09/18 0023  Weight: (!) 143 kg (315 lb 4.1 oz) (!) 145.6 kg (320 lb 15.8 oz) (!) 145.6 kg (320 lb 15.8 oz)    Intake/Output: I/O last 3 completed shifts: In: 3200.3 [I.V.:1487.3; NG/GT:1187.3; IV Piggyback:525.6] Out: 3532 [Urine:450; DJMEQ:6834; Stool:3]   Intake/Output this shift:  No intake/output data recorded.  Physical Exam: General: Critically ill  Head: ETT, OGT  Eyes: Scleral + edema  Neck: trachea midline  Lungs:  PRVC 40%  Heart: tachycardia  Abdomen:  Soft, nontender, bowel sounds    Extremities: +edema  Neurologic: Intubated and sedated  Skin: Mottled appearance  Access: Right temporary femoral dialysis catheter    Basic Metabolic Panel: Recent Labs  Lab 01/08/18 1223 01/08/18 1550 01/08/18 1953 01/08/18 2334 01/09/18 0342  NA 136  135 135 135 134* 134*  K 4.7  4.6 4.4 4.4 4.2 4.5  CL 103  102 103 103 105 103  CO2 24  25 24 23  21* 22  GLUCOSE 122*  123* 143* 163* 169* 152*  BUN 36*  38* 36* 36* 37* 39*  CREATININE 1.22  1.29* 1.23 1.16 1.07 1.18  CALCIUM 8.6*  8.5* 8.4* 8.5* 8.1* 8.5*  MG 2.0 2.0 2.0 2.0 2.1  PHOS 3.9 2.9 2.9 2.5 3.5    Liver Function Tests: Recent Labs  Lab 01/03/18 0516 01/03/18 0959  01/05/18 0530  01/06/18 0423  01/08/18 1223 01/08/18 1550 01/08/18 1953 01/08/18 2334 01/09/18 0342  AST 240* 498*  --  1,984*  --  842*  --  134*  --    --   --   --   ALT 217* 404*  --  2,352*  --  1,886*  --  833*  --   --   --   --   ALKPHOS 62 54  --  57  --  58  --  102  --   --   --   --   BILITOT 2.5* 4.0*  --  2.2*  --  2.0*  --  1.8*  --   --   --   --   PROT 6.7 5.5*  --  4.6*  --  4.8*  --  6.8  --   --   --   --   ALBUMIN 3.6 2.9*   < > 2.4*  2.5*   < > 2.4*   < > 4.1  4.2 3.7 3.6 3.4* 3.6   < > = values in this interval not displayed.   Recent Labs  Lab 01/03/18 1044  LIPASE 52*  AMYLASE 45   Recent Labs  Lab 01/03/18 1518  AMMONIA 64*    CBC: Recent Labs  Lab 01/04/18 0415 01/05/18 0530 01/07/18 0334 01/08/18 0358 01/09/18 0342  WBC 21.8* 12.6* 9.9 11.7* 10.5  NEUTROABS  --  10.9* 8.2*  --   --  HGB 10.0* 9.5* 9.4* 9.3* 9.9*  HCT 30.3* 28.4* 29.0* 29.6* 31.1*  MCV 98.6 97.8 102.0* 103.7* 103.1*  PLT 96* 84* 41* 54* 67*    Cardiac Enzymes: Recent Labs  Lab 12/26/2017 1152 01/03/18 0516 01/03/18 0959 01/03/18 1518  CKTOTAL  --  180  --   --   TROPONINI 0.04* 0.04* 0.04* 0.06*    BNP: Invalid input(s): POCBNP  CBG: Recent Labs  Lab 01/08/18 1935 01/08/18 2000 01/08/18 2347 01/09/18 0352 01/09/18 0747  GLUCAP 132* 151* 137* 138* 100*    Microbiology: Results for orders placed or performed during the hospital encounter of 01/05/2018  Culture, blood (routine x 2)     Status: None   Collection Time: 01/03/18 10:42 AM  Result Value Ref Range Status   Specimen Description BLOOD LT [facility]  Final   Special Requests   Final    BOTTLES DRAWN AEROBIC AND ANAEROBIC Blood Culture adequate volume   Culture   Final    NO GROWTH 5 DAYS Performed at [facility], [address] [place], [place] [address]    Report Status 01/08/2018 FINAL  Final  MRSA PCR Screening     Status: None   Collection Time: 01/03/18 10:56 AM  Result Value Ref Range Status   MRSA by PCR NEGATIVE NEGATIVE Final    Comment:        The GeneXpert MRSA Assay (FDA approved for NASAL specimens only), is one component  of a comprehensive MRSA colonization surveillance program. It is not intended to diagnose MRSA infection nor to guide or monitor treatment for MRSA infections. Performed at [facility], [address] [place], [place] [address]   CULTURE, BLOOD (ROUTINE X 2) w Reflex to ID Panel     Status: None   Collection Time: 01/03/18  1:25 PM  Result Value Ref Range Status   Specimen Description BLOOD LINE  Final   Special Requests   Final    BOTTLES DRAWN AEROBIC AND ANAEROBIC Blood Culture adequate volume   Culture   Final    NO GROWTH 5 DAYS Performed at [facility], [address], [place], [place] [address]    Report Status 01/08/2018 FINAL  Final  Culture, Urine     Status: None   Collection Time: 01/03/18  4:49 PM  Result Value Ref Range Status   Specimen Description   Final    URINE, RANDOM Performed at [facility], [address], [place], [place] [address]    Special Requests   Final    NONE Performed at [facility], [address], [place], [place] [address]    Culture   Final    NO GROWTH Performed at [facility], [place] [address], [place], [place] [address]    Report Status 01/04/2018 FINAL  Final  Culture, expectorated sputum-assessment     Status: None   Collection Time: 01/03/18  5:53 PM  Result Value Ref Range Status   Specimen Description TRACHEAL ASPIRATE  Final   Special Requests NONE  Final   Sputum evaluation   Final    THIS SPECIMEN IS ACCEPTABLE FOR SPUTUM CULTURE Performed at [facility], [address], [place],  [address]    Report Status 01/03/2018 FINAL  Final  Culture, respiratory (NON-Expectorated)     Status: None   Collection Time: 01/03/18  5:53 PM  Result Value Ref Range Status   Specimen Description   Final    TRACHEAL ASPIRATE Performed at  [facility], [address] [place], [place] [address]    Special Requests   Final    NONE Reflexed from  [id number] Performed at [facility], [place], [place] [address]    Gram Stain   Final    FEW WBC PRESENT, PREDOMINANTLY PMN RARE SQUAMOUS EPITHELIAL CELLS PRESENT FEW GRAM POSITIVE COCCI IN CLUSTERS FEW GRAM NEGATIVE COCCOBACILLI    Culture   Final    Consistent with normal respiratory flora. Performed at [facility], [place] [address] [place], [place] [address]    Report Status 01/06/2018 FINAL  Final    Coagulation Studies: Recent Labs    01/08/18 1223  LABPROT 37.8*  INR 3.88    Urinalysis: No results for input(s): COLORURINE, LABSPEC, PHURINE, GLUCOSEU, HGBUR, BILIRUBINUR, KETONESUR, PROTEINUR, UROBILINOGEN, NITRITE, LEUKOCYTESUR in the last 72 hours.  Invalid input(s): APPERANCEUR    Imaging: Dg Chest Port 1 View  Result Date: 01/08/2018 CLINICAL DATA:  Respiratory failure EXAM: PORTABLE CHEST 1 VIEW COMPARISON:  Yesterday FINDINGS: Endotracheal tube tip just below the clavicular heads. Right IJ line with tip at the SVC. An orogastric tube reaches the stomach. Cardiopericardial enlargement, stable. Haziness of the bilateral chest attributed to layering pleural fluid. No Kerley lines or air bronchogram. Remote left clavicle fracture. IMPRESSION: 1. Stable compared to yesterday. 2. Layering pleural effusions and atelectasis. 3. Unremarkable hardware positioning. Electronically Signed   By: Monte Fantasia M.D.   On: 01/08/2018 08:50   Dg Chest Port 1 View  Result Date: 01/07/2018 CLINICAL DATA:  Acute respiratory failure EXAM: PORTABLE CHEST 1 VIEW COMPARISON:  01/04/2018 FINDINGS: Cardiac shadow is stable. Endotracheal tube, nasogastric catheter and right jugular central line are again seen and stable. Right-sided pleural effusion is again identified. Mild central vascular congestion is again noted. Mild bibasilar atelectasis is again noted. IMPRESSION: Stable bibasilar atelectasis and moderate right effusion. Electronically Signed   By: Inez Catalina M.D.   On: 01/07/2018 10:52     Medications:   . sodium chloride    . sodium chloride 5 mL/hr at 01/09/18 0600  . anticoagulant sodium citrate    . argatroban 0.5 mcg/kg/min (01/09/18 0700)  . fentaNYL infusion INTRAVENOUS 50 mcg/hr (01/09/18 0700)  . piperacillin-tazobactam (ZOSYN)  IV 12.5 mL/hr at 01/09/18 0600  . propofol (DIPRIVAN) infusion 10 mcg/kg/min (01/09/18 0700)   . B-complex with vitamin C  1 tablet Per Tube QHS  . chlorhexidine gluconate (MEDLINE KIT)  15 mL Mouth Rinse BID  . Chlorhexidine Gluconate Cloth  6 each Topical Q0600  . famotidine  20 mg Per Tube BID  . feeding supplement (PRO-STAT SUGAR FREE 64)  60 mL Per Tube 5 X Daily  . feeding supplement (VITAL HIGH PROTEIN)  1,000 mL Per Tube Q24H  . insulin aspart  0-15 Units Subcutaneous Q4H  . ipratropium-albuterol  3 mL Nebulization Q6H  . mouth rinse  15 mL Mouth Rinse 10 times per day  . multivitamin  15 mL Per Tube Daily  . multivitamin-lutein  1 capsule Per Tube Daily  . polyvinyl alcohol  1 drop Both Eyes TID  . sodium chloride flush  3 mL Intravenous Q12H  . sodium chloride flush  3 mL Intravenous Q12H  . thiamine  100 mg Per Tube Daily  . tuberculin  5 Units Intradermal Once   sodium chloride, sodium chloride, acetaminophen **OR** acetaminophen, anticoagulant sodium citrate, iopamidol, sodium chloride flush, sodium chloride flush, vecuronium  Assessment/ Plan:  [name]  is a 52 y.o. white male with diabetes mellitus type 2, hypertension, chronic systolic heart failure ejection fraction 45%, who was admitted to [facility] on 01/04/2018 for evaluation of abdominal discomfort and bloating.   Patient had decompensation on January 03, 2018 with severe hypotension requiring multiple pressors, acute respiratory failure, shock liver and acute renal failure.  1.  Acute renal failure secondary to severe hypotension, and also exposed to contrast with CTA. Baseline careatinine 1.14, 12/12/16 2.   Hyperkalemia. 3.  Metabolic acidosis with elevated lactic acid level, pt was on metformin and TCAs as outpt.  4.  Acute respiratory failure. 5.  Elevated liver enzymes. 6. Hyponatremia 7. Hypoalbuminemia.  8. Thrombocytopenia - on HIT protocol, argatroban gtt  Plan:  Discontinue CRRT. Plan on intermittent hemodialysis treatment today: 4 hours, 4 liters ultrafiltration Evaluate for dialysis need daily Oliguric urine output - continue to monitor.    LOS: 6 Rishab Stoudt 6/28/20197:53 AM

## 2018-01-09 NOTE — Progress Notes (Signed)
Performed sedation vacation- pt open eyes- and moved left toes on command- Vent changed to PS 15/5- patient tolerated for approximately 10 min.  Vitals were adequate- though patient was abdominal breathing heavily and his face turned purple.  Relayed this information to Dr. Lonn Georgiaonforti- patient re- sedated on previous drips.  Wife and son updated at bedside.

## 2018-01-09 NOTE — Progress Notes (Signed)
ANTICOAGULATION CONSULT NOTE   Pharmacy Consult for Argatroban Dosing  Indication: atrial fibrillation  Allergies  Allergen Reactions  . Heparin     Possible HIT 6/26    Patient Measurements: Height: 5\' 9"  (175.3 cm) Weight: (!) 320 lb 15.8 oz (145.6 kg) IBW/kg (Calculated) : 70.7 Heparin Dosing Weight: 98.9 kg  Vital Signs: Temp: 97.6 F (36.4 C) (06/28 0400) Temp Source: Oral (06/28 0400) BP: 97/76 (06/28 0600) Pulse Rate: 124 (06/28 0600)  Labs: Recent Labs    01/07/18 0334  01/07/18 1600  01/08/18 0342 01/08/18 0358  01/08/18 1223  01/08/18 1953 01/08/18 2334 01/09/18 0342  HGB 9.4*  --   --   --   --  9.3*  --   --   --   --   --  9.9*  HCT 29.0*  --   --   --   --  29.6*  --   --   --   --   --  31.1*  PLT 41*  --   --   --   --  54*  --   --   --   --   --  67*  APTT  --    < > 67*  --  83*  --   --   --   --   --   --  88*  LABPROT  --   --   --   --   --   --   --  37.8*  --   --   --   --   INR  --   --   --   --   --   --   --  3.88  --   --   --   --   HEPARINUNFRC 0.40  --   --   --   --   --   --   --   --   --   --   --   CREATININE 1.22   < > 1.13   < > 1.20  --    < > 1.22  1.29*   < > 1.16 1.07 1.18   < > = values in this interval not displayed.    Estimated Creatinine Clearance: 105.5 mL/min (by C-G formula based on SCr of 1.18 mg/dL).   Assessment: Pharmacy consulted for argatroban dosing for 52 yo male ICU patient currently requiring mechanical ventilation and CRRT. Patient previously on Heparin drip for atrial fibrillation.   Goal of Therapy:  APTT 50-90 Monitor platelets by anticoagulation protocol: Yes   Plan:  Patient being worked up for possible HIT. Platelets are currently 41 and HIT score is 5. Heparin Serotonin Assay ordered.   Argatroban initiated at 0.315mcg/kg/min using adjusted body weight. Second aPTT is within target range. Will obtain follow up aPTT with am labs.   Pharmacy will continue to monitor and adjust per consult.    6/27:  APTT @ 0342 = 83 Will continue this pt on current rate and recheck aPTT on 6/28 with AM labs.   6/28:  APTT @ 0342 = 88 Will continue this pt on current rate and recheck aPTT on 6/29 with AM labs.   Vanessia Bokhari D 01/09/2018

## 2018-01-09 NOTE — Progress Notes (Signed)
   01/09/18 1625  Vital Signs  Temp 99.3 F (37.4 C)  Temp Source Oral  Pulse Rate (!) 130  Pulse Rate Source Monitor  Resp (!) 25  BP 110/73  BP Location Other (Comment) (aline)  BP Method Automatic  Patient Position (if appropriate) Lying  Oxygen Therapy  SpO2 97 %  O2 Device Ventilator  FiO2 (%) 40 %  End Tidal CO2 (EtCO2) 42  Pain Assessment  Pain Scale 0-10  Pain Score 0  Post-Hemodialysis Assessment  Rinseback Volume (mL) 250 mL  Dialyzer Clearance Lightly streaked  Duration of HD Treatment -hour(s) 4 hour(s)  Hemodialysis Intake (mL) 500 mL  UF Total -Machine (mL) 2000 mL  Net UF (mL) 1500 mL  Tolerated HD Treatment Yes  Post-Hemodialysis Comments report given to Charli, RN  Education / Care Plan  Dialysis Education Provided Yes  Hemodialysis Catheter Right Femoral vein Double-lumen  Placement Date/Time: 01/03/18 1200   Person Inserting Catheter: Dr. Peggye Pittichards  Orientation: Right  Access Location: Femoral vein  Hemodialysis Catheter Type: Double-lumen  Site Condition No complications  Blue Lumen Status Capped (Central line)  Red Lumen Status Capped (Central line)  Catheter fill solution Heparin 1000 units/ml  Catheter fill volume (Arterial) 1.4 cc  Catheter fill volume (Venous) 1.4  Dressing Type Biopatch;Occlusive  Dressing Status Intact;Dry;Clean  Drainage Description None  Post treatment catheter status Capped and Clamped

## 2018-01-09 NOTE — Progress Notes (Signed)
[place] for Heparin drip management Indication: atrial fibrillation  Allergies  Allergen Reactions  . No Known Allergies     Patient Measurements: Height: 5' 9"  (175.3 cm) Weight: (!) 324 lb 11.8 oz (147.3 kg) IBW/kg (Calculated) : 70.7 Heparin Dosing Weight: 98.9 kg  Vital Signs: Temp: 99.4 F (37.4 C) (06/28 1225) Temp Source: Oral (06/28 1225) BP: 94/66 (06/28 1400) Pulse Rate: 128 (06/28 1400)  Labs: Recent Labs    01/07/18 0334  01/07/18 1600  01/08/18 0342 01/08/18 0358  01/08/18 1223  01/08/18 2334 01/09/18 0342 01/09/18 0751 01/09/18 0945  HGB 9.4*  --   --   --   --  9.3*  --   --   --   --  9.9*  --  10.1*  HCT 29.0*  --   --   --   --  29.6*  --   --   --   --  31.1*  --  31.9*  PLT 41*  --   --   --   --  54*  --   --   --   --  67*  --  76*  APTT  --    < > 67*  --  83*  --   --   --   --   --  88*  --   --   LABPROT  --   --   --   --   --   --   --  37.8*  --   --   --   --   --   INR  --   --   --   --   --   --   --  3.88  --   --   --   --   --   HEPARINUNFRC 0.40  --   --   --   --   --   --   --   --   --   --   --   --   CREATININE 1.22   < > 1.13   < > 1.20  --    < > 1.22  1.29*   < > 1.07 1.18 1.18  --    < > = values in this interval not displayed.    Estimated Creatinine Clearance: 106.1 mL/min (by C-G formula based on SCr of 1.18 mg/dL).   Medical History: Past Medical History:  Diagnosis Date  . Diabetes mellitus without complication ([place])   . Hypertension     Medications:  Scheduled:  . B-complex with vitamin C  1 tablet Per Tube QHS  . chlorhexidine gluconate (MEDLINE KIT)  15 mL Mouth Rinse BID  . Chlorhexidine Gluconate Cloth  6 each Topical Q0600  . famotidine  20 mg Per Tube BID  . feeding supplement (PRO-STAT SUGAR FREE 64)  60 mL Per Tube 5 X Daily  . feeding supplement (VITAL HIGH PROTEIN)  1,000 mL Per Tube Q24H  . insulin aspart  0-15 Units Subcutaneous Q4H  .  ipratropium-albuterol  3 mL Nebulization Q6H  . mouth rinse  15 mL Mouth Rinse 10 times per day  . multivitamin  15 mL Per Tube Daily  . multivitamin-lutein  1 capsule Per Tube Daily  . polyvinyl alcohol  1 drop Both Eyes TID  . sodium chloride flush  3 mL Intravenous Q12H  . sodium chloride flush  3 mL Intravenous Q12H  . thiamine  100 mg Per Tube Daily  . tuberculin  5 Units Intradermal Once   Infusions:  . sodium chloride    . sodium chloride 5 mL/hr at 01/09/18 0600  . anticoagulant sodium citrate    . argatroban 0.5 mcg/kg/min (01/09/18 0700)  . fentaNYL infusion INTRAVENOUS 150 mcg/hr (01/09/18 1037)  . heparin 1,700 Units/hr (01/09/18 1317)  . piperacillin-tazobactam (ZOSYN)  IV 3.375 g (01/09/18 1317)  . propofol (DIPRIVAN) infusion 20 mcg/kg/min (01/09/18 1417)    Assessment: Pharmacy consulted for heparin drip dosing for 52 yo male ICU patient currently requiring mechanical ventilation. Patient was on CRRT but starting HD today. Patient previously with prolonged thrombocytopenia, then on argatroban for possible HIT, but HIT panel came back negative.  Goal of Therapy:  Heparin level 0.3-0.7 units/ml Monitor platelets by anticoagulation protocol: Yes   Plan:  Per AM ICU rounds, will resume heparin at 1700 units/hr and monitor platelets daily. Obtain a heparin level at 1900.    Pharmacy will continue to monitor and adjust per consult.  Debbrah Sampedro J Maquita Sandoval 01/09/2018,2:27 PM

## 2018-01-09 NOTE — Progress Notes (Signed)
CRITICAL CARE NOTE  CC  follow up severe respiratory failure  SUBJECTIVE Patient remains critically ill Prognosis is guarded Weaned off paralytics Attempt sedation vacation with wife at bedside  [place]: 6/22 ECHO-EF 45-50%  CULTURES: MRSA PCR- Neg  ANTIBIOTICS: 6/22Zosyn; Vancomycin 6/24 Vancomycin D/C  SIGNIFICANT EVENTS: 6/21 admission to hospital 6/22 transfer to ICU,    LINES/TUBES: 6/22 Foley 6/22 R-IJ TLC, R- Femoral hemodialysis cath 6/22 L- Femoral Arterial line 6/22 Endotracheal tube 6/26 failed wean off paralytics   BP 93/73   Pulse (!) 124   Temp 97.9 F (36.6 C) (Oral)   Resp (!) 25   Ht 5' 9"  (1.753 m)   Wt (!) 320 lb 15.8 oz (145.6 kg)   SpO2 98%   BMI 47.40 kg/m    REVIEW OF SYSTEMS  PATIENT IS UNABLE TO PROVIDE COMPLETE REVIEW OF SYSTEM S DUE TO SEVERE CRITICAL ILLNESS AND ENCEPHALOPATHY   PHYSICAL EXAMINATION:  GENERAL:critically ill appearing, +resp distress HEAD: Normocephalic, atraumatic.  EYES: Pupils equal, round, reactive to light.  No scleral icterus.  MOUTH: Moist mucosal membrane. NECK: Supple. No thyromegaly. No nodules. No JVD.  PULMONARY: +rhonchi, +wheezing CARDIOVASCULAR: S1 and S2. Regular rate and rhythm. No murmurs, rubs, or gallops.  GASTROINTESTINAL: Soft, nontender, -distended. No masses. Positive bowel sounds. No hepatosplenomegaly.  MUSCULOSKELETAL: No swelling, clubbing, or edema.  NEUROLOGIC: obtunded, GCS<8 SKIN:intact,warm,dry  ASSESSMENT AND PLAN 52 yo morbidly obese WM with severe resp failure and renal failure on CRRT from probable acute viral syndrome with severe acidosis and progressive resp and renal failure   Severe Hypoxic and Hypercapnic Respiratory Failure -continue Full MV support -continue Bronchodilator Therapy -Wean Fio2 and PEEP as tolerated -will perform SAT/SBt when respiratory parameters are met   Renal Failure-most likely due to  ATN -follow chem 7 -follow UO -continue Foley Catheter-assess need On CRRT follow up nephrology recs  NEUROLOGY -stop all sedatives  Septic shock -use vasopressors to keep MAP>65 if needed  CARDIAC ICU monitoring  ID -follow up cultures   DVT/GI PRX ordered TRANSFUSIONS AS NEEDED MONITOR FSBS ASSESS the need for LABS as needed   Critical Care Time devoted to patient care services described in this note is 35 minutes.   Hermelinda Dellen, DO   Patient ID: Ryan Webb, male   DOB: 1965-07-18, 52 y.o.   MRN: [id number]

## 2018-01-10 ENCOUNTER — Inpatient Hospital Stay: Payer: BLUE CROSS/BLUE SHIELD

## 2018-01-10 DIAGNOSIS — L8990 Pressure ulcer of unspecified site, unspecified stage: Secondary | ICD-10-CM

## 2018-01-10 LAB — PARATHYROID HORMONE, INTACT (NO CA): PTH: 41 pg/mL (ref 15–65)

## 2018-01-10 LAB — CBC
HCT: 31.8 % — ABNORMAL LOW (ref 40.0–52.0)
Hemoglobin: 9.9 g/dL — ABNORMAL LOW (ref 13.0–18.0)
MCH: 32.3 pg (ref 26.0–34.0)
MCHC: 31.1 g/dL — ABNORMAL LOW (ref 32.0–36.0)
MCV: 103.8 fL — AB (ref 80.0–100.0)
Platelets: 116 10*3/uL — ABNORMAL LOW (ref 150–440)
RBC: 3.06 MIL/uL — ABNORMAL LOW (ref 4.40–5.90)
RDW: 18.1 % — AB (ref 11.5–14.5)
WBC: 15.3 10*3/uL — AB (ref 3.8–10.6)

## 2018-01-10 LAB — URINALYSIS, ROUTINE W REFLEX MICROSCOPIC
BACTERIA UA: NONE SEEN
Bilirubin Urine: NEGATIVE
Glucose, UA: NEGATIVE mg/dL
Hgb urine dipstick: NEGATIVE
KETONES UR: NEGATIVE mg/dL
LEUKOCYTES UA: NEGATIVE
NITRITE: NEGATIVE
PH: 5 (ref 5.0–8.0)
Protein, ur: 100 mg/dL — AB
SPECIFIC GRAVITY, URINE: 1.016 (ref 1.005–1.030)
Squamous Epithelial / LPF: NONE SEEN (ref 0–5)

## 2018-01-10 LAB — HEPARIN LEVEL (UNFRACTIONATED): Heparin Unfractionated: 0.43 IU/mL (ref 0.30–0.70)

## 2018-01-10 LAB — GLUCOSE, CAPILLARY
GLUCOSE-CAPILLARY: 176 mg/dL — AB (ref 70–99)
GLUCOSE-CAPILLARY: 145 mg/dL — AB (ref 70–99)
GLUCOSE-CAPILLARY: 141 mg/dL — AB (ref 70–99)
GLUCOSE-CAPILLARY: 143 mg/dL — AB (ref 70–99)
Glucose-Capillary: 124 mg/dL — ABNORMAL HIGH (ref 70–99)
Glucose-Capillary: 129 mg/dL — ABNORMAL HIGH (ref 70–99)
Glucose-Capillary: 144 mg/dL — ABNORMAL HIGH (ref 70–99)

## 2018-01-10 LAB — RENAL FUNCTION PANEL
ALBUMIN: 3.6 g/dL (ref 3.5–5.0)
ANION GAP: 9 (ref 5–15)
BUN: 54 mg/dL — ABNORMAL HIGH (ref 6–20)
CO2: 24 mmol/L (ref 22–32)
CREATININE: 1.75 mg/dL — AB (ref 0.61–1.24)
Calcium: 8.4 mg/dL — ABNORMAL LOW (ref 8.9–10.3)
Chloride: 104 mmol/L (ref 98–111)
GFR, EST AFRICAN AMERICAN: 50 mL/min — AB (ref >60)
GFR, EST NON AFRICAN AMERICAN: 43 mL/min — AB (ref >60)
Glucose, Bld: 154 mg/dL — ABNORMAL HIGH (ref 70–99)
PHOSPHORUS: 4.1 mg/dL (ref 2.5–4.6)
Potassium: 4.4 mmol/L (ref 3.5–5.1)
SODIUM: 137 mmol/L (ref 135–145)

## 2018-01-10 LAB — CALCIUM, IONIZED: Calcium, Ionized, Serum: 4.7 mg/dL (ref 4.5–5.6)

## 2018-01-10 LAB — MAGNESIUM: Magnesium: 2.3 mg/dL (ref 1.7–2.4)

## 2018-01-10 LAB — HEPATITIS B SURFACE ANTIGEN: Hepatitis B Surface Ag: NEGATIVE

## 2018-01-10 LAB — HEPATITIS B SURFACE ANTIBODY,QUALITATIVE: HEP B S AB: NONREACTIVE

## 2018-01-10 LAB — PROCALCITONIN: Procalcitonin: 2.24 ng/mL

## 2018-01-10 LAB — HEPATITIS C ANTIBODY

## 2018-01-10 LAB — HEPATITIS B CORE ANTIBODY, TOTAL: Hep B Core Total Ab: NEGATIVE

## 2018-01-10 MED ORDER — SODIUM CHLORIDE 0.9% FLUSH
10.0000 mL | INTRAVENOUS | Status: DC | PRN
  Administered 2018-01-13: 10 mL
  Filled 2018-01-10: qty 40

## 2018-01-10 MED ORDER — SODIUM CHLORIDE 0.9% FLUSH
10.0000 mL | Freq: Two times a day (BID) | INTRAVENOUS | Status: DC
  Administered 2018-01-10 – 2018-01-12 (×6): 10 mL
  Administered 2018-01-13: 30 mL
  Administered 2018-01-13 – 2018-01-14 (×2): 10 mL
  Administered 2018-01-14: 40 mL
  Administered 2018-01-15: 10 mL
  Administered 2018-01-15: 40 mL
  Administered 2018-01-16: 20 mL
  Administered 2018-01-16: 40 mL
  Administered 2018-01-17 – 2018-01-21 (×8): 10 mL

## 2018-01-10 MED ORDER — AMIODARONE IV BOLUS ONLY 150 MG/100ML
INTRAVENOUS | Status: AC
  Administered 2018-01-10: 150 mg via INTRAVENOUS
  Filled 2018-01-10: qty 100

## 2018-01-10 MED ORDER — IBUPROFEN 400 MG PO TABS
600.0000 mg | ORAL_TABLET | Freq: Once | ORAL | Status: AC
  Administered 2018-01-10: 600 mg via ORAL
  Filled 2018-01-10: qty 2

## 2018-01-10 MED ORDER — VANCOMYCIN HCL IN DEXTROSE 1-5 GM/200ML-% IV SOLN
1000.0000 mg | INTRAVENOUS | Status: DC | PRN
  Filled 2018-01-10: qty 200

## 2018-01-10 MED ORDER — PIPERACILLIN-TAZOBACTAM 3.375 G IVPB
3.3750 g | Freq: Two times a day (BID) | INTRAVENOUS | Status: DC
  Administered 2018-01-10 – 2018-01-12 (×6): 3.375 g via INTRAVENOUS
  Filled 2018-01-10 (×6): qty 50

## 2018-01-10 MED ORDER — BACITRACIN-NEOMYCIN-POLYMYXIN 400-5-5000 EX OINT
TOPICAL_OINTMENT | CUTANEOUS | Status: DC | PRN
  Administered 2018-01-10: 1 via TOPICAL
  Filled 2018-01-10 (×2): qty 1

## 2018-01-10 MED ORDER — STERILE WATER FOR INJECTION IJ SOLN
INTRAMUSCULAR | Status: AC
  Administered 2018-01-10: 04:00:00
  Filled 2018-01-10: qty 10

## 2018-01-10 MED ORDER — SODIUM CHLORIDE 0.9 % IV SOLN
2000.0000 mg | Freq: Once | INTRAVENOUS | Status: AC
  Administered 2018-01-10: 2000 mg via INTRAVENOUS
  Filled 2018-01-10: qty 2000

## 2018-01-10 MED ORDER — AMIODARONE IV BOLUS ONLY 150 MG/100ML
150.0000 mg | Freq: Once | INTRAVENOUS | Status: AC
  Administered 2018-01-10: 150 mg via INTRAVENOUS

## 2018-01-10 MED ORDER — FUROSEMIDE 10 MG/ML IJ SOLN
80.0000 mg | Freq: Two times a day (BID) | INTRAMUSCULAR | Status: DC
  Administered 2018-01-10 – 2018-01-12 (×4): 80 mg via INTRAVENOUS
  Filled 2018-01-10 (×4): qty 8

## 2018-01-10 MED ORDER — STERILE WATER FOR INJECTION IJ SOLN
INTRAMUSCULAR | Status: AC
  Administered 2018-01-10: 10 mL
  Filled 2018-01-10: qty 10

## 2018-01-10 MED ORDER — FUROSEMIDE 10 MG/ML IJ SOLN
80.0000 mg | Freq: Once | INTRAMUSCULAR | Status: AC
  Administered 2018-01-10: 80 mg via INTRAVENOUS
  Filled 2018-01-10: qty 8

## 2018-01-10 NOTE — Progress Notes (Signed)
[facility]  ROUNDING NOTE   Subjective:   Tmax 101.5  Intermittent hemodialysis yesterday, 6/28 - tolerated treatment well. UF of 1.9 liters.  Wife at bedside.   UOP 367m, however has put out 1610msince 4am.   Wbc 15.3  Objective:  Vital signs in last 24 hours:  Temp:  [98.7 F (37.1 C)-101.5 F (38.6 C)] 101.5 F (38.6 C) (06/29 0845) Pulse Rate:  [124-135] 128 (06/29 0845) Resp:  [10-28] 25 (06/29 0845) BP: (93-133)/(62-85) 119/78 (06/29 0800) SpO2:  [96 %-98 %] 96 % (06/29 0845) FiO2 (%):  [40 %] 40 % (06/29 0800) Weight:  [147.3 kg (324 lb 11.8 oz)] 147.3 kg (324 lb 11.8 oz) (06/28 1225)  Weight change: 1.7 kg (3 lb 12 oz) Filed Weights   01/08/18 0100 01/09/18 0023 01/09/18 1225  Weight: (!) 145.6 kg (320 lb 15.8 oz) (!) 145.6 kg (320 lb 15.8 oz) (!) 147.3 kg (324 lb 11.8 oz)    Intake/Output: I/O last 3 completed shifts: In: 1825.1 [I.V.:1121.1; NG/GT:544.7; IV Piggyback:159.4] Out: 504008Urine:456; Emesis/NG output:15; OtQPYPP:5093Stool:2]   Intake/Output this shift:  Total I/O In: 101 [I.V.:101] Out: -   Physical Exam: General: Critically ill  Head: ETT, OGT  Eyes: Scleral + edema  Neck: trachea midline  Lungs:  PRVC 40%  Heart: tachycardia  Abdomen:  Soft, nontender, bowel sounds    Extremities: +edema  Neurologic: Intubated and sedated  Skin: No lesions  Access: Right temporary femoral dialysis catheter    Basic Metabolic Panel: Recent Labs  Lab 01/08/18 1953 01/08/18 2334 01/09/18 0342 01/09/18 0751 01/09/18 2142 01/10/18 0228  NA 135 134* 134* 135 137 137  K 4.4 4.2 4.5 4.2 4.2 4.4  CL 103 105 103 104 105 104  CO2 23 21* 22 24 23 24   GLUCOSE 163* 169* 152* 141* 141* 154*  BUN 36* 37* 39* 38* 44* 54*  CREATININE 1.16 1.07 1.18 1.18 1.44* 1.75*  CALCIUM 8.5* 8.1* 8.5* 8.3* 8.3* 8.4*  MG 2.0 2.0 2.1  --  2.2 2.3  PHOS 2.9 2.5 3.5 2.9 3.6 4.1    Liver Function Tests: Recent Labs  Lab 01/03/18 0959   01/05/18 0530  01/06/18 0423  01/08/18 1223  01/08/18 1953 01/08/18 2334 01/09/18 0342 01/09/18 0751 01/10/18 0228  AST 498*  --  1,984*  --  842*  --  134*  --   --   --   --   --   --   ALT 404*  --  2,352*  --  1,886*  --  833*  --   --   --   --   --   --   ALKPHOS 54  --  57  --  58  --  102  --   --   --   --   --   --   BILITOT 4.0*  --  2.2*  --  2.0*  --  1.8*  --   --   --   --   --   --   PROT 5.5*  --  4.6*  --  4.8*  --  6.8  --   --   --   --   --   --   ALBUMIN 2.9*   < > 2.4*  2.5*   < > 2.4*   < > 4.1  4.2   < > 3.6 3.4* 3.6 3.4* 3.6   < > = values in this interval not displayed.  Recent Labs  Lab 01/03/18 1044  LIPASE 52*  AMYLASE 45   Recent Labs  Lab 01/03/18 1518  AMMONIA 64*    CBC: Recent Labs  Lab 01/05/18 0530 01/07/18 0334 01/08/18 0358 01/09/18 0342 01/09/18 0945 01/10/18 0228  WBC 12.6* 9.9 11.7* 10.5 13.4* 15.3*  NEUTROABS 10.9* 8.2*  --   --  10.5*  --   HGB 9.5* 9.4* 9.3* 9.9* 10.1* 9.9*  HCT 28.4* 29.0* 29.6* 31.1* 31.9* 31.8*  MCV 97.8 102.0* 103.7* 103.1* 104.6* 103.8*  PLT 84* 41* 54* 67* 76* 116*    Cardiac Enzymes: Recent Labs  Lab 01/03/18 0959 01/03/18 1518  TROPONINI 0.04* 0.06*    BNP: Invalid input(s): POCBNP  CBG: Recent Labs  Lab 01/09/18 1600 01/09/18 1943 01/10/18 0008 01/10/18 0407 01/10/18 0733  GLUCAP 138* 153* 144* 76* 176*    Microbiology: Results for orders placed or performed during the hospital encounter of 12/28/2017  Culture, blood (routine x 2)     Status: None   Collection Time: 01/03/18 10:42 AM  Result Value Ref Range Status   Specimen Description BLOOD LT [facility]  Final   Special Requests   Final    BOTTLES DRAWN AEROBIC AND ANAEROBIC Blood Culture adequate volume   Culture   Final    NO GROWTH 5 DAYS Performed at [facility], [address] [place], [place] [address]    Report Status 01/08/2018 FINAL  Final  MRSA PCR Screening     Status: None   Collection Time:  01/03/18 10:56 AM  Result Value Ref Range Status   MRSA by PCR NEGATIVE NEGATIVE Final    Comment:        The GeneXpert MRSA Assay (FDA approved for NASAL specimens only), is one component of a comprehensive MRSA colonization surveillance program. It is not intended to diagnose MRSA infection nor to guide or monitor treatment for MRSA infections. Performed at [facility], [address] [place], [place] [address]   CULTURE, BLOOD (ROUTINE X 2) w Reflex to ID Panel     Status: None   Collection Time: 01/03/18  1:25 PM  Result Value Ref Range Status   Specimen Description BLOOD LINE  Final   Special Requests   Final    BOTTLES DRAWN AEROBIC AND ANAEROBIC Blood Culture adequate volume   Culture   Final    NO GROWTH 5 DAYS Performed at [facility], [address] [place], [place] [address]    Report Status 01/08/2018 FINAL  Final  Culture, Urine     Status: None   Collection Time: 01/03/18  4:49 PM  Result Value Ref Range Status   Specimen Description   Final    URINE, RANDOM Performed at [facility], [address], [place], [place] [address]    Special Requests   Final    NONE Performed at [facility], [address] [place], [place] [address]    Culture   Final    NO GROWTH Performed at [facility], [place] [address] [place],  [address]    Report Status 01/04/2018 FINAL  Final  Culture, expectorated sputum-assessment     Status: None   Collection Time: 01/03/18  5:53 PM  Result Value Ref Range Status   Specimen Description TRACHEAL ASPIRATE  Final   Special Requests NONE  Final   Sputum evaluation   Final    THIS SPECIMEN IS ACCEPTABLE FOR SPUTUM CULTURE Performed at [facility], [place]  [address] [place], [place] [address]    Report Status 01/03/2018 FINAL  Final  Culture, respiratory (NON-Expectorated)     Status: None   Collection Time: 01/03/18  5:53 PM  Result Value Ref Range Status    Specimen Description   Final    TRACHEAL ASPIRATE Performed at [facility], [address] [place], [place] [address]    Special Requests   Final    NONE Reflexed from [id number] Performed at [facility], [address] [place], [place] [address]    Gram Stain   Final    FEW WBC PRESENT, PREDOMINANTLY PMN RARE SQUAMOUS EPITHELIAL CELLS PRESENT FEW GRAM POSITIVE COCCI IN CLUSTERS FEW GRAM NEGATIVE COCCOBACILLI    Culture   Final    Consistent with normal respiratory flora. Performed at [facility], [place] [address], [place], [place] [address]    Report Status 01/06/2018 FINAL  Final    Coagulation Studies: Recent Labs    01/08/18 1223  LABPROT 37.8*  INR 3.88    Urinalysis: No results for input(s): COLORURINE, LABSPEC, PHURINE, GLUCOSEU, HGBUR, BILIRUBINUR, KETONESUR, PROTEINUR, UROBILINOGEN, NITRITE, LEUKOCYTESUR in the last 72 hours.  Invalid input(s): APPERANCEUR    Imaging: Dg Chest Port 1 View  Result Date: 01/10/2018 CLINICAL DATA:  Acute respiratory distress. EXAM: PORTABLE CHEST 1 VIEW COMPARISON:  01/09/2018. FINDINGS: Endotracheal tube in satisfactory position. Right jugular catheter tip in the superior vena cava. Nasogastric tube extending into the stomach. Stable enlarged cardiac silhouette. Mild increase in bibasilar opacity. Thoracic spine degenerative changes. IMPRESSION: Increased bibasilar atelectasis or pneumonia with stable cardiomegaly and probable pleural effusions. Electronically Signed   By: Claudie Revering M.D.   On: 01/10/2018 07:34   Dg Chest Port 1 View  Result Date: 01/09/2018 CLINICAL DATA:  On mechanically assisted ventilation. EXAM: PORTABLE CHEST 1 VIEW COMPARISON:  01/10/2018 FINDINGS: Endotracheal tube is 4.7 cm above the carina. Nasogastric tube extends into the abdomen. Right jugular central line tip in the SVC region. Negative for pneumothorax. Hazy densities at both lung bases are suggestive for volume loss and  possibly pleural effusions. Lung aeration has minimal change from the recent comparison examination. Heart size is upper limits of normal. Central vascular structures are mildly prominent. IMPRESSION: Stable appearance of the support apparatuses. Endotracheal tube is appropriately positioned above the carina. Persistent basilar chest densities. Findings are suggestive for combination of volume loss/consolidation and pleural effusions. Minimal change from the recent comparison examination. Electronically Signed   By: Markus Daft M.D.   On: 01/09/2018 12:41     Medications:   . sodium chloride    . sodium chloride 5 mL/hr at 01/10/18 0800  . anticoagulant sodium citrate    . fentaNYL infusion INTRAVENOUS 150 mcg/hr (01/10/18 0800)  . heparin 1,700 Units/hr (01/10/18 0800)  . propofol (DIPRIVAN) infusion 29.991 mcg/kg/min (01/10/18 0800)   . B-complex with vitamin C  1 tablet Per Tube QHS  . chlorhexidine gluconate (MEDLINE KIT)  15 mL Mouth Rinse BID  . Chlorhexidine Gluconate Cloth  6 each Topical Q0600  . famotidine  20 mg Per Tube BID  . feeding supplement (PRO-STAT SUGAR FREE 64)  60 mL Per Tube 5 X Daily  . feeding supplement (VITAL HIGH PROTEIN)  1,000 mL Per Tube Q24H  . furosemide  80 mg Intravenous Once  . insulin aspart  0-15 Units Subcutaneous Q4H  . ipratropium-albuterol  3 mL Nebulization Q6H  . mouth rinse  15 mL Mouth Rinse 10 times per day  .  multivitamin  15 mL Per Tube Daily  . multivitamin-lutein  1 capsule Per Tube Daily  . polyvinyl alcohol  1 drop Both Eyes TID  . sodium chloride flush  3 mL Intravenous Q12H  . sodium chloride flush  3 mL Intravenous Q12H  . thiamine  100 mg Per Tube Daily  . tuberculin  5 Units Intradermal Once   sodium chloride, sodium chloride, acetaminophen **OR** acetaminophen, anticoagulant sodium citrate, iopamidol, sodium chloride flush, sodium chloride flush, vecuronium  Assessment/ Plan:  Mr. DREY SHAFF is a 52 y.o. white male with  diabetes mellitus type 2, hypertension, chronic systolic heart failure ejection fraction 45%, who was admitted to [facility] on 01/11/2018 for evaluation of abdominal discomfort and bloating.   Patient had decompensation on January 03, 2018 with severe hypotension requiring multiple pressors, acute respiratory failure, shock liver and acute renal failure.  1.  Acute renal failure secondary to severe hypotension, and also exposed to contrast with CTA. Baseline careatinine 1.14, 12/12/16 2.  Hyperkalemia. 3.  Metabolic acidosis with elevated lactic acid level, pt was on metformin and TCAs as outpt.  4.  Acute respiratory failure. 5.  Elevated liver enzymes. 6. Hyponatremia 7. Hypoalbuminemia.  8. Thrombocytopenia  9. Fever 10. Anemia  Plan:  Discontinued CRRT. Intermittent hemodialysis treatment yesterday. Tolerated first treatment.  Oliguric urine output - Trial bolus of furosemide 20m x 1 - Pan cultures and empiric antibiotics as per ICU Plan on intermittent hemodialysis treatment today: 4 hours, 4 liters ultrafiltration Evaluate for dialysis need daily Oliguric urine output - continue to monitor.    LOS: 7 Sumayya Muha 6/29/20199:03 AM

## 2018-01-10 NOTE — Progress Notes (Signed)
ANTICOAGULATION CONSULT NOTE - Follow Up Consult  Pharmacy Consult for Heparin Drip Indication: atrial fibrillation  Allergies  Allergen Reactions  . No Known Allergies     Patient Measurements: Height: 5\' 9"  (175.3 cm) Weight: (!) 324 lb 11.8 oz (147.3 kg) IBW/kg (Calculated) : 70.7 Heparin Dosing Weight: 98.9 kg  Vital Signs: Temp: 98.7 F (37.1 C) (06/29 0000) Temp Source: Axillary (06/29 0000) BP: 126/74 (06/29 0300) Pulse Rate: 133 (06/29 0300)  Labs: Recent Labs    01/07/18 1600  01/08/18 0342  01/08/18 1223  01/09/18 0342 01/09/18 0751 01/09/18 0945 01/09/18 1758 01/09/18 2142 01/10/18 0012 01/10/18 0228  HGB  --   --   --    < >  --   --  9.9*  --  10.1*  --   --   --  9.9*  HCT  --   --   --    < >  --   --  31.1*  --  31.9*  --   --   --  31.8*  PLT  --   --   --    < >  --   --  67*  --  76*  --   --   --  116*  APTT 67*  --  83*  --   --   --  88*  --   --   --   --   --   --   LABPROT  --   --   --   --  37.8*  --   --   --   --   --   --   --   --   INR  --   --   --   --  3.88  --   --   --   --   --   --   --   --   HEPARINUNFRC  --   --   --   --   --   --   --   --   --  0.30  --  <0.10* 0.43  CREATININE 1.13   < > 1.20   < > 1.22  1.29*   < > 1.18 1.18  --   --  1.44*  --  1.75*   < > = values in this interval not displayed.    Estimated Creatinine Clearance: 71.6 mL/min (A) (by C-G formula based on SCr of 1.75 mg/dL (H)).   Assessment: Patient transitioned over to Heparin drip today. Currently infusing at 1700 units/hr.  6/28 1800 Heparin level resulted at 0.30  Goal of Therapy:  Heparin level 0.3-0.7 units/ml Monitor platelets by anticoagulation protocol: Yes   Plan:  Will continue with current rate and check a confirmatory level in 6 hours.  6/29: HL @ 00:00 < 0.1 , this does not appear to be a valid line drawn, will repeat lab 6/29: HL @ 0228 = 0.43.  Will continue this pt on current rate and recheck HL on 6/30 with AM labs.    Ryan Webb  01/10/2018 3:15 AM

## 2018-01-10 NOTE — Consult Note (Signed)
Pharmacy Antibiotic Note  Ryan Webb is a 52 y.o. male admitted on 06-Sep-2017 with pneumonia and sepsis.  Pharmacy has been consulted for vancomycin/zosyn dosing. Patient with renal failure, was on CRRT now started on intermittent HD. Plans are for HD 4 hr 4 L today. And follow daily for HD needs. Pt now with fever, elevated WBC  Plan: Vancomycin 2000mg  once as a load. Then 1000mg  q HD session. Will need to follow closely for HD plan. Zosyn 3.375g q 12 hr  Height: 5\' 9"  (175.3 cm) Weight: (!) 324 lb 11.8 oz (147.3 kg) IBW/kg (Calculated) : 70.7  Temp (24hrs), Avg:99.5 F (37.5 C), Min:98.7 F (37.1 C), Max:101.5 F (38.6 C)  Recent Labs  Lab 01/05/18 0609  01/07/18 0334  01/07/18 0811  01/07/18 1600  01/07/18 2359  01/08/18 0358 01/08/18 0822  01/08/18 1550  01/08/18 2334 01/09/18 0342 01/09/18 0751 01/09/18 0945 01/09/18 2142 01/10/18 0228  WBC  --   --  9.9  --   --   --   --   --   --   --  11.7*  --   --   --   --   --  10.5  --  13.4*  --  15.3*  CREATININE  --    < > 1.22   < > 1.26*   < > 1.13   < >  --    < >  --  1.22   < > 1.23   < > 1.07 1.18 1.18  --  1.44* 1.75*  LATICACIDVEN  --    < >  --   --  1.7  --  1.4  --  1.8  --   --  1.8  --  1.4  --   --   --   --   --   --   --   VANCOTROUGH 7*  --   --   --   --   --   --   --   --   --   --   --   --   --   --   --   --   --   --   --   --    < > = values in this interval not displayed.    Estimated Creatinine Clearance: 71.6 mL/min (A) (by C-G formula based on SCr of 1.75 mg/dL (H)).    Allergies  Allergen Reactions  . No Known Allergies     Antimicrobials this admission: zosyn 6/23 >>  vancomycin 6/29 >>   Dose adjustments this admission:   Microbiology results: 6/22 BCx: NG 5 days 6/22 UCx: NG  6/22 Sputum: normal respiratory flora  6/22 MRSA PCR: neg  Thank you for allowing pharmacy to be a part of this patient's care.  Olene FlossMelissa D Veora Fonte, Pharm.D, BCPS Clinical Pharmacist 01/10/2018 9:42  AM

## 2018-01-10 NOTE — Progress Notes (Addendum)
CRITICAL CARE NOTE  CC  follow up severe respiratory failure  SUBJECTIVE No significant change in last 24 hours. Patient remains tachycardic in the 120s. A bolus of amiodarone given without much improvement. Patient sedation has been adjusted for ventilator dyssynchrony. Trying to avoid paralytics  STUDIES: 6/22 ECHO-EF 45-50%  CULTURES: MRSA PCR- Neg  ANTIBIOTICS: 6/22Zosyn; Vancomycin 6/24 Vancomycin D/C  SIGNIFICANT EVENTS: 6/21 admission to hospital 6/22 transfer to ICU,    LINES/TUBES: 6/22 Foley 6/22 R-IJ TLC, R- Femoral hemodialysis cath 6/22 L- Femoral Arterial line 6/22 Endotracheal tube 6/26 failed wean off paralytics   BP 119/78 (BP Location: Left Arm)   Pulse (!) 128   Temp (!) 101.5 F (38.6 C) (Oral)   Resp (!) 25   Ht 5\' 9"  (1.753 m)   Wt (!) 324 lb 11.8 oz (147.3 kg)   SpO2 96%   BMI 47.96 kg/m    REVIEW OF SYSTEMS  PATIENT IS UNABLE TO PROVIDE COMPLETE REVIEW OF SYSTEM S DUE TO SEVERE CRITICAL ILLNESS AND ENCEPHALOPATHY   PHYSICAL EXAMINATION:  GENERAL:critically ill appearing, +resp distress HEAD: Normocephalic, atraumatic.  EYES: Pupils equal, round, reactive to light.  No scleral icterus.  MOUTH: Moist mucosal membrane. NECK: Supple. No thyromegaly. No nodules. No JVD.  PULMONARY: +rhonchi, +wheezing CARDIOVASCULAR: S1 and S2. Regular rate and rhythm. No murmurs, rubs, or gallops.  GASTROINTESTINAL: Soft, nontender, -distended. No masses. Positive bowel sounds. No hepatosplenomegaly.  MUSCULOSKELETAL: No swelling, clubbing, or edema.  NEUROLOGIC: obtunded, GCS<8 SKIN:intact,warm,dry  ASSESSMENT AND PLAN  Severe Hypoxic and Hypercapnic Respiratory Failure Patient with febrile episode, chest x-ray appears to have right lower lobe pneumonia, will obtain cultures and empirically started on vancomycin and Zosyn  leukocytosis  Renal Failure most likely due to ATN, Changed to IHD per nephrology  Anemia. No evidence of active  bleeding   Critical Care Time devoted to patient care services described in this note is 35 minutes.   Tora KindredJohn Escarlet Saathoff, DO   Patient ID: Lucas Mallowony W Pierre, male   DOB: 10/06/1965, 52 y.o.   MRN: [id number] Patient ID: Lucas Mallowony W Piccini, male   DOB: 09/27/1965, 52 y.o.   MRN: [id number]

## 2018-01-10 NOTE — Progress Notes (Signed)
[employer] - [place] [facility]   PATIENT NAME: Ryan Webb    MR#:  [id number]  DATE OF BIRTH:  03/25/1966  SUBJECTIVE:   Patient remains critically ill.  On vent, febrile Plan to discontinue CRRT and try intermittent hemodialysis.  REVIEW OF SYSTEMS:    unAble to obtain  tolerating Diet: Tube feeds        DRUG ALLERGIES:   Allergies  Allergen Reactions  . No Known Allergies     VITALS:  Blood pressure 105/70, pulse (!) 128, temperature (!) 101 F (38.3 C), temperature source Oral, resp. rate (!) 25, height 5\' 9"  (1.753 m), weight (!) 147.3 kg (324 lb 11.8 oz), SpO2 96 %.  PHYSICAL EXAMINATION:  Constitutional: Appears obese sedated on vent HENT: Normocephalic.  Intubated Eyes: , no scleral icterus.  Neck: Normal ROM. Neck supple. No JVD. No tracheal deviation. CVS: Tachycardic S1/S2 +, no murmurs, no gallops, no carotid bruit.  Pulmonary: Effort and breath sounds normal, no stridor, rhonchi, wheezes, rales.  Abdominal: Soft. BS +,  no distension, tenderness, rebound or guarding.  Musculoskeletal: sedated Neuro:sedated     . Skin: Skin is warm and dry. No rash noted.  2+ lower extremity edema Psychiatric: sedated    LABORATORY PANEL:   CBC Recent Labs  Lab 01/10/18 0228  WBC 15.3*  HGB 9.9*  HCT 31.8*  PLT 116*   ------------------------------------------------------------------------------------------------------------------  Chemistries  Recent Labs  Lab 01/08/18 1223  01/10/18 0228  NA 136  135   < > 137  K 4.7  4.6   < > 4.4  CL 103  102   < > 104  CO2 24  25   < > 24  GLUCOSE 122*  123*   < > 154*  BUN 36*  38*   < > 54*  CREATININE 1.22  1.29*   < > 1.75*  CALCIUM 8.6*  8.5*   < > 8.4*  MG 2.0   < > 2.3  AST 134*  --   --   ALT 833*  --   --   ALKPHOS 102  --   --   BILITOT 1.8*  --   --    < > = values in this interval not displayed.    ------------------------------------------------------------------------------------------------------------------  Cardiac Enzymes Recent Labs  Lab 01/03/18 1518  TROPONINI 0.06*   ------------------------------------------------------------------------------------------------------------------  RADIOLOGY:  Dg Chest Port 1 View  Result Date: 01/10/2018 CLINICAL DATA:  Acute respiratory distress. EXAM: PORTABLE CHEST 1 VIEW COMPARISON:  01/09/2018. FINDINGS: Endotracheal tube in satisfactory position. Right jugular catheter tip in the superior vena cava. Nasogastric tube extending into the stomach. Stable enlarged cardiac silhouette. Mild increase in bibasilar opacity. Thoracic spine degenerative changes. IMPRESSION: Increased bibasilar atelectasis or pneumonia with stable cardiomegaly and probable pleural effusions. Electronically Signed   By: Beckie SaltsSteven  Reid M.D.   On: 01/10/2018 07:34   Dg Chest Port 1 View  Result Date: 01/09/2018 CLINICAL DATA:  On mechanically assisted ventilation. EXAM: PORTABLE CHEST 1 VIEW COMPARISON:  01/10/2018 FINDINGS: Endotracheal tube is 4.7 cm above the carina. Nasogastric tube extends into the abdomen. Right jugular central line tip in the SVC region. Negative for pneumothorax. Hazy densities at both lung bases are suggestive for volume loss and possibly pleural effusions. Lung aeration has minimal change from the recent comparison examination. Heart size is upper limits of normal. Central vascular structures are mildly prominent. IMPRESSION: Stable appearance of the support apparatuses. Endotracheal tube is appropriately positioned above the carina.  Persistent basilar chest densities. Findings are suggestive for combination of volume loss/consolidation and pleural effusions. Minimal change from the recent comparison examination. Electronically Signed   By: Richarda Overlie M.D.   On: 01/09/2018 12:41     ASSESSMENT AND PLAN:   52 year old male with obesity,  diabetes, hypertension came in with tachyarrhythmia and was noted to be in septic shock  1.  Septic shock with multiorgan failure.- PNA Continue vent management as per ICU Sputum culture obtained from tracheal aspirate is positive for gram-positive cocci in clusters and gram-negative coccobacilli 6/22 Continue Zosyn, vancomycin empirically.  Follow-up on the cultures 6/29 Previous blood cultures obtained on 01/03/2018 are negative.  Urine culture obtained on 01/03/2018 is also negative MRSA PCR is negative  2.  Ectopic atrial tachycardia versus atrial flutter-remains tachycardic CT Angio of the chest and pelvis negative for any PE or aortic dissection. Echocardiogram with LV ejection fraction of 45% which is stable from prior. Platelet  count is better and patient is started on heparin drip.  Monitor platelet count closely   3.  Acute renal failure due to ATN from septic shock and contrast exposure due to CT scans. IV Lasix bolus trial today, ordered by nephrology not considering hemodialysis today.  Just had hemodialysis  6/28.  CRRT discontinued Management per nephrology  4.  Thrombocytopenia due to sepsis: Platelet count is increasing.   5.  Acute respiratory failure: Due to sepsis and multiorgan failure Remains on ventilator and management per ICU team   6.  Elevated LFTs due to shock liver with multiorgan failure due to sepsis.  LFTs are improving GI consultation appreciated  7.  Nutrition: Continue tube feeds Overall prognosis guarded.  Patient is critically ill.   CODE STATUS: FULL  TOTAL TIME TAKING CARE OF THIS PATIENT: 25 minutes.     POSSIBLE D/C ??, DEPENDING ON CLINICAL CONDITION.   Ramonita Lab M.D on 01/10/2018 at 2:38 PM  Between 7am to 6pm - Pager - [phone] After 6pm go to [email or web address] - password [email or web address]  [facility]  Office  [phone]  CC: Primary care physician; Anola Gurney, PA  Note: This dictation was prepared with  Dragon dictation along with smaller phrase technology. Any transcriptional errors that result from this process are unintentional.

## 2018-01-11 ENCOUNTER — Inpatient Hospital Stay: Payer: BLUE CROSS/BLUE SHIELD

## 2018-01-11 LAB — RENAL FUNCTION PANEL
ANION GAP: 8 (ref 5–15)
Albumin: 3 g/dL — ABNORMAL LOW (ref 3.5–5.0)
BUN: 87 mg/dL — ABNORMAL HIGH (ref 6–20)
CHLORIDE: 107 mmol/L (ref 98–111)
CO2: 25 mmol/L (ref 22–32)
Calcium: 8 mg/dL — ABNORMAL LOW (ref 8.9–10.3)
Creatinine, Ser: 2.49 mg/dL — ABNORMAL HIGH (ref 0.61–1.24)
GFR, EST AFRICAN AMERICAN: 33 mL/min — AB (ref >60)
GFR, EST NON AFRICAN AMERICAN: 28 mL/min — AB (ref >60)
Glucose, Bld: 131 mg/dL — ABNORMAL HIGH (ref 70–99)
Phosphorus: 5.3 mg/dL — ABNORMAL HIGH (ref 2.5–4.6)
Potassium: 3.2 mmol/L — ABNORMAL LOW (ref 3.5–5.1)
Sodium: 140 mmol/L (ref 135–145)

## 2018-01-11 LAB — GLUCOSE, CAPILLARY
GLUCOSE-CAPILLARY: 163 mg/dL — AB (ref 70–99)
GLUCOSE-CAPILLARY: 157 mg/dL — AB (ref 70–99)
GLUCOSE-CAPILLARY: 175 mg/dL — AB (ref 70–99)
Glucose-Capillary: 119 mg/dL — ABNORMAL HIGH (ref 70–99)
Glucose-Capillary: 180 mg/dL — ABNORMAL HIGH (ref 70–99)
Glucose-Capillary: 210 mg/dL — ABNORMAL HIGH (ref 70–99)

## 2018-01-11 LAB — MAGNESIUM: MAGNESIUM: 2.4 mg/dL (ref 1.7–2.4)

## 2018-01-11 LAB — CBC
HEMATOCRIT: 26.9 % — AB (ref 40.0–52.0)
HEMOGLOBIN: 8.9 g/dL — AB (ref 13.0–18.0)
MCH: 34 pg (ref 26.0–34.0)
MCHC: 33.1 g/dL (ref 32.0–36.0)
MCV: 102.7 fL — ABNORMAL HIGH (ref 80.0–100.0)
Platelets: 124 10*3/uL — ABNORMAL LOW (ref 150–440)
RBC: 2.62 MIL/uL — ABNORMAL LOW (ref 4.40–5.90)
RDW: 18.4 % — AB (ref 11.5–14.5)
WBC: 9.7 10*3/uL (ref 3.8–10.6)

## 2018-01-11 LAB — HEPARIN LEVEL (UNFRACTIONATED): HEPARIN UNFRACTIONATED: 0.65 [IU]/mL (ref 0.30–0.70)

## 2018-01-11 LAB — TRIGLYCERIDES: TRIGLYCERIDES: 125 mg/dL (ref <150)

## 2018-01-11 LAB — PROCALCITONIN: PROCALCITONIN: 3.13 ng/mL

## 2018-01-11 MED ORDER — LABETALOL HCL 5 MG/ML IV SOLN
5.0000 mg | Freq: Once | INTRAVENOUS | Status: AC
  Administered 2018-01-11: 5 mg via INTRAVENOUS
  Filled 2018-01-11: qty 4

## 2018-01-11 NOTE — Progress Notes (Signed)
CRITICAL CARE NOTE  CC  follow up severe respiratory failure  SUBJECTIVE No significant change in last 24 hours. Patient remains tachycardic in the 120s.   STUDIES: 6/22 ECHO-EF 45-50%  CULTURES: MRSA PCR- Neg  ANTIBIOTICS: 6/22Zosyn; Vancomycin 6/24 Vancomycin D/C  SIGNIFICANT EVENTS: 6/21 admission to hospital 6/22 transfer to ICU,    LINES/TUBES: 6/22 Foley 6/22 R-IJ TLC, R- Femoral hemodialysis cath 6/22 L- Femoral Arterial line 6/22 Endotracheal tube 6/26 failed wean off paralytics   BP 91/64   Pulse (!) 124   Temp 98 F (36.7 C) (Oral)   Resp (!) 25   Ht 5\' 9"  (1.753 m)   Wt (!) 311 lb 4.6 oz (141.2 kg)   SpO2 96%   BMI 45.97 kg/m    REVIEW OF SYSTEMS  PATIENT IS UNABLE TO PROVIDE COMPLETE REVIEW OF SYSTEM S DUE TO SEVERE CRITICAL ILLNESS AND ENCEPHALOPATHY   PHYSICAL EXAMINATION:  GENERAL:critically ill appearing, +resp distress HEAD: Normocephalic, atraumatic.  EYES: Pupils equal, round, reactive to light.  No scleral icterus.  MOUTH: Moist mucosal membrane. NECK: Supple. No thyromegaly. No nodules. No JVD.  PULMONARY: +rhonchi, +wheezing CARDIOVASCULAR: S1 and S2. Regular rate and rhythm. No murmurs, rubs, or gallops.  GASTROINTESTINAL: Soft, nontender, -distended. No masses. Positive bowel sounds. No hepatosplenomegaly.  MUSCULOSKELETAL: No swelling, clubbing, or edema.  NEUROLOGIC: obtunded, GCS<8 SKIN:intact,warm,dry  ASSESSMENT AND PLAN  Severe Hypoxic and Hypercapnic Respiratory Failure, with superimposed nosocomial pneumonia. Restarted back on vancomycin and Zosyn, pending respiratory culture,will obtain chest x-ray this morning  Leukocytosis  Hypokalemia. We'll leave for nephrology unsure if patient will receive IHD today  Renal Failure most likely due to ATN, Changed to IHD per nephrology  Anemia. No evidence of active bleeding   Critical Care Time devoted to patient care services described in this note is 35 minutes.    Tora KindredJohn Krisy Dix, DO   Patient ID: Lucas Mallowony W Chaviano, male   DOB: 12/24/1965, 52 y.o.   MRN: [id number] Patient ID: Lucas Mallowony W Nobile, male   DOB: 11/18/1965, 52 y.o.   MRN: [id number] Patient ID: Lucas Mallowony W Tribbey, male   DOB: 03/27/1966, 52 y.o.   MRN: [id number]

## 2018-01-11 NOTE — Progress Notes (Signed)
[facility]  ROUNDING NOTE   Subjective:   Tmax 101.5. Tcurrent 98 - blood and sputum with no growth. Urinalysis clear. Started on broad spectrum abx: zosyn and vanc  UOP 1665 - furosemide 76m IV q12  Objective:  Vital signs in last 24 hours:  Temp:  [98 F (36.7 C)-101.5 F (38.6 C)] 98 F (36.7 C) (06/30 0400) Pulse Rate:  [124-128] 124 (06/30 0221) Resp:  [25] 25 (06/30 0221) BP: (91-111)/(62-72) 91/64 (06/29 1900) SpO2:  [94 %-97 %] 96 % (06/30 0407) FiO2 (%):  [40 %] 40 % (06/30 0809) Weight:  [141.2 kg (311 lb 4.6 oz)] 141.2 kg (311 lb 4.6 oz) (06/30 0437)  Weight change: -6.1 kg (-13 lb 7.2 oz) Filed Weights   01/09/18 0023 01/09/18 1225 01/11/18 0437  Weight: (!) 145.6 kg (320 lb 15.8 oz) (!) 147.3 kg (324 lb 11.8 oz) (!) 141.2 kg (311 lb 4.6 oz)    Intake/Output: I/O last 3 completed shifts: In: 3901.6 [I.V.:1825.4; NG/GT:1370; IV Piggyback:706.3] Out: 19833[[ASNKN:3976 Emesis/NG output:15]   Intake/Output this shift:  No intake/output data recorded.  Physical Exam: General: Critically ill  Head: ETT, OGT  Eyes: Scleral + edema  Neck: trachea midline  Lungs:  PRVC 40%  Heart: tachycardia  Abdomen:  Soft, nontender, bowel sounds    Extremities: +edema  Neurologic: Intubated and sedated  Skin: No lesions  Access: Right temporary femoral dialysis catheter 6/22 Dr. RCelesta Aver   Basic Metabolic Panel: Recent Labs  Lab 01/08/18 2334 01/09/18 0342 01/09/18 0751 01/09/18 2142 01/10/18 0228 01/11/18 0428  NA 134* 134* 135 137 137 140  K 4.2 4.5 4.2 4.2 4.4 3.2*  CL 105 103 104 105 104 107  CO2 21* 22 24 23 24 25   GLUCOSE 169* 152* 141* 141* 154* 131*  BUN 37* 39* 38* 44* 54* 87*  CREATININE 1.07 1.18 1.18 1.44* 1.75* 2.49*  CALCIUM 8.1* 8.5* 8.3* 8.3* 8.4* 8.0*  MG 2.0 2.1  --  2.2 2.3 2.4  PHOS 2.5 3.5 2.9 3.6 4.1 5.3*    Liver Function Tests: Recent Labs  Lab 01/05/18 0530  01/06/18 0423  01/08/18 1223  01/08/18 2334  01/09/18 0342 01/09/18 0751 01/10/18 0228 01/11/18 0428  AST 1,984*  --  842*  --  134*  --   --   --   --   --   --   ALT 2,352*  --  1,886*  --  833*  --   --   --   --   --   --   ALKPHOS 57  --  58  --  102  --   --   --   --   --   --   BILITOT 2.2*  --  2.0*  --  1.8*  --   --   --   --   --   --   PROT 4.6*  --  4.8*  --  6.8  --   --   --   --   --   --   ALBUMIN 2.4*  2.5*   < > 2.4*   < > 4.1  4.2   < > 3.4* 3.6 3.4* 3.6 3.0*   < > = values in this interval not displayed.   No results for input(s): LIPASE, AMYLASE in the last 168 hours. No results for input(s): AMMONIA in the last 168 hours.  CBC: Recent Labs  Lab 01/05/18 0530 01/07/18 0334 01/08/18 0358 01/09/18 0342  01/09/18 0945 01/10/18 0228 01/11/18 0428  WBC 12.6* 9.9 11.7* 10.5 13.4* 15.3* 9.7  NEUTROABS 10.9* 8.2*  --   --  10.5*  --   --   HGB 9.5* 9.4* 9.3* 9.9* 10.1* 9.9* 8.9*  HCT 28.4* 29.0* 29.6* 31.1* 31.9* 31.8* 26.9*  MCV 97.8 102.0* 103.7* 103.1* 104.6* 103.8* 102.7*  PLT 84* 41* 54* 67* 76* 116* 124*    Cardiac Enzymes: No results for input(s): CKTOTAL, CKMB, CKMBINDEX, TROPONINI in the last 168 hours.  BNP: Invalid input(s): POCBNP  CBG: Recent Labs  Lab 01/10/18 1538 01/10/18 1926 01/10/18 2312 01/11/18 0316 01/11/18 0707  GLUCAP 145* 141* 143* 119* 163*    Microbiology: Results for orders placed or performed during the hospital encounter of 12/23/2017  Culture, blood (routine x 2)     Status: None   Collection Time: 01/03/18 10:42 AM  Result Value Ref Range Status   Specimen Description BLOOD LT [facility]  Final   Special Requests   Final    BOTTLES DRAWN AEROBIC AND ANAEROBIC Blood Culture adequate volume   Culture   Final    NO GROWTH 5 DAYS Performed at [facility], [address] [place], [place] [address]    Report Status 01/08/2018 FINAL  Final  MRSA PCR Screening     Status: None   Collection Time: 01/03/18 10:56 AM  Result Value Ref Range Status   MRSA by  PCR NEGATIVE NEGATIVE Final    Comment:        The GeneXpert MRSA Assay (FDA approved for NASAL specimens only), is one component of a comprehensive MRSA colonization surveillance program. It is not intended to diagnose MRSA infection nor to guide or monitor treatment for MRSA infections. Performed at [facility], [address] [place], [place] [address]   CULTURE, BLOOD (ROUTINE X 2) w Reflex to ID Panel     Status: None   Collection Time: 01/03/18  1:25 PM  Result Value Ref Range Status   Specimen Description BLOOD LINE  Final   Special Requests   Final    BOTTLES DRAWN AEROBIC AND ANAEROBIC Blood Culture adequate volume   Culture   Final    NO GROWTH 5 DAYS Performed at [facility], [address], [place], [place] [address]    Report Status 01/08/2018 FINAL  Final  Culture, Urine     Status: None   Collection Time: 01/03/18  4:49 PM  Result Value Ref Range Status   Specimen Description   Final    URINE, RANDOM Performed at [facility], [address], [place], [place] [address]    Special Requests   Final    NONE Performed at [facility], [address], [place], [place] [address]    Culture   Final    NO GROWTH Performed at [facility], [place] [address] [place], [place] [address]    Report Status 01/04/2018 FINAL  Final  Culture, expectorated sputum-assessment     Status: None   Collection Time: 01/03/18  5:53 PM  Result Value Ref Range Status   Specimen Description TRACHEAL ASPIRATE  Final   Special Requests NONE  Final   Sputum evaluation   Final    THIS SPECIMEN IS ACCEPTABLE FOR SPUTUM CULTURE Performed at [facility], [address], [place], [place] [address]    Report Status 01/03/2018 FINAL  Final  Culture, respiratory (NON-Expectorated)     Status: None   Collection Time: 01/03/18  5:53 PM  Result Value Ref Range Status   Specimen Description   Final    TRACHEAL ASPIRATE Performed at  [facility], [address] [place], [place] [address]    Special Requests   Final    NONE Reflexed from [id number] Performed at [facility], [address] [place], [place] [address]    Gram Stain   Final    FEW WBC PRESENT, PREDOMINANTLY PMN RARE SQUAMOUS EPITHELIAL CELLS PRESENT FEW GRAM POSITIVE COCCI IN CLUSTERS FEW GRAM NEGATIVE COCCOBACILLI    Culture   Final    Consistent with normal respiratory flora. Performed at [facility], [place] [address] [place], [place] [address]    Report Status 01/06/2018 FINAL  Final  CULTURE, BLOOD (ROUTINE X 2) w Reflex to ID Panel     Status: None (Preliminary result)   Collection Time: 01/10/18  9:35 AM  Result Value Ref Range Status   Specimen Description BLOOD L WRIST  Final   Special Requests   Final    BOTTLES DRAWN AEROBIC AND ANAEROBIC Blood Culture adequate volume   Culture   Final    NO GROWTH < 24 HOURS Performed at [facility], [address] [place], [place] [address]    Report Status PENDING  Incomplete  CULTURE, BLOOD (ROUTINE X 2) w Reflex to ID Panel     Status: None (Preliminary result)   Collection Time: 01/10/18  9:47 AM  Result Value Ref Range Status   Specimen Description BLOOD R FOREARM  Final   Special Requests   Final    BOTTLES DRAWN AEROBIC AND ANAEROBIC Blood Culture adequate volume   Culture   Final    NO GROWTH < 24 HOURS Performed at [facility], [address] [place], [place] [address]    Report Status PENDING  Incomplete  Culture, respiratory (NON-Expectorated)     Status: None (Preliminary result)   Collection Time: 01/10/18 12:03 PM  Result Value Ref Range Status   Specimen Description   Final    TRACHEAL ASPIRATE Performed at [facility], [address], [place], [place] [address]    Special Requests   Final    NONE Performed at [facility], [place] [place], [place] [address]    Gram Stain   Final    ABUNDANT WBC  PRESENT,BOTH PMN AND MONONUCLEAR RARE SQUAMOUS EPITHELIAL CELLS PRESENT RARE BUDDING YEAST SEEN Performed at [facility], [place] [address] [place], [place] [address]    Culture PENDING  Incomplete   Report Status PENDING  Incomplete    Coagulation Studies: Recent Labs    01/08/18 1223  LABPROT 37.8*  INR 3.88    Urinalysis: Recent Labs    01/10/18 1016  COLORURINE AMBER*  LABSPEC 1.016  PHURINE 5.0  GLUCOSEU NEGATIVE  HGBUR NEGATIVE  BILIRUBINUR NEGATIVE  KETONESUR NEGATIVE  PROTEINUR 100*  NITRITE NEGATIVE  LEUKOCYTESUR NEGATIVE      Imaging: Dg Chest Port 1 View  Result Date: 01/10/2018 CLINICAL DATA:  Acute respiratory distress. EXAM: PORTABLE CHEST 1 VIEW COMPARISON:  01/09/2018. FINDINGS: Endotracheal tube in satisfactory position. Right jugular catheter tip in the superior vena cava. Nasogastric tube extending into the stomach. Stable enlarged cardiac silhouette. Mild increase in bibasilar opacity. Thoracic spine degenerative changes. IMPRESSION: Increased bibasilar atelectasis or pneumonia with stable cardiomegaly and probable pleural effusions. Electronically Signed   By: Claudie Revering M.D.   On: 01/10/2018 07:34   Dg Chest Port 1  View  Result Date: 01/09/2018 CLINICAL DATA:  On mechanically assisted ventilation. EXAM: PORTABLE CHEST 1 VIEW COMPARISON:  01/10/2018 FINDINGS: Endotracheal tube is 4.7 cm above the carina. Nasogastric tube extends into the abdomen. Right jugular central line tip in the SVC region. Negative for pneumothorax. Hazy densities at both lung bases are suggestive for volume loss and possibly pleural effusions. Lung aeration has minimal change from the recent comparison examination. Heart size is upper limits of normal. Central vascular structures are mildly prominent. IMPRESSION: Stable appearance of the support apparatuses. Endotracheal tube is appropriately positioned above the carina. Persistent basilar chest densities. Findings are  suggestive for combination of volume loss/consolidation and pleural effusions. Minimal change from the recent comparison examination. Electronically Signed   By: Markus Daft M.D.   On: 01/09/2018 12:41     Medications:   . sodium chloride 5 mL/hr at 01/11/18 0600  . anticoagulant sodium citrate    . fentaNYL infusion INTRAVENOUS 7.5 mcg/hr (01/11/18 0600)  . heparin 1,700 Units/hr (01/11/18 0600)  . piperacillin-tazobactam (ZOSYN)  IV Stopped (01/11/18 0141)  . propofol (DIPRIVAN) infusion 15 mcg/kg/min (01/11/18 0509)  . vancomycin     . B-complex with vitamin C  1 tablet Per Tube QHS  . chlorhexidine gluconate (MEDLINE KIT)  15 mL Mouth Rinse BID  . Chlorhexidine Gluconate Cloth  6 each Topical Q0600  . famotidine  20 mg Per Tube BID  . feeding supplement (PRO-STAT SUGAR FREE 64)  60 mL Per Tube 5 X Daily  . feeding supplement (VITAL HIGH PROTEIN)  1,000 mL Per Tube Q24H  . furosemide  80 mg Intravenous Q12H  . insulin aspart  0-15 Units Subcutaneous Q4H  . ipratropium-albuterol  3 mL Nebulization Q6H  . mouth rinse  15 mL Mouth Rinse 10 times per day  . multivitamin  15 mL Per Tube Daily  . multivitamin-lutein  1 capsule Per Tube Daily  . polyvinyl alcohol  1 drop Both Eyes TID  . sodium chloride flush  10-40 mL Intracatheter Q12H  . sodium chloride flush  3 mL Intravenous Q12H  . thiamine  100 mg Per Tube Daily  . tuberculin  5 Units Intradermal Once   sodium chloride, acetaminophen **OR** acetaminophen, anticoagulant sodium citrate, iopamidol, neomycin-bacitracin-polymyxin, sodium chloride flush, sodium chloride flush, vancomycin, vecuronium  Assessment/ Plan:  Mr. VERNIS EID is a 52 y.o. white male with diabetes mellitus type 2, hypertension, chronic systolic heart failure ejection fraction 45%, who was admitted to [facility] on 12/23/2017 for evaluation of abdominal discomfort and bloating.   Patient had decompensation on January 03, 2018 with severe hypotension requiring multiple  pressors, acute respiratory failure, shock liver and acute renal failure. Started CRRT from 6/22 to 6/28. Intermittent hemodialysis on 6/28. Trial of furosemide with good results on 6/29.   1.  Acute renal failure secondary to severe hypotension, and also exposed to contrast with CTA. Baseline careatinine 1.14, 12/12/16 2.  Hyperkalemia. 3.  Metabolic acidosis with elevated lactic acid level, pt was on metformin and TCAs as outpt.  4.  Acute respiratory failure. 5.  Elevated liver enzymes. 6. Hyponatremia 7. Hypoalbuminemia.  8. Thrombocytopenia  9. Fever 10. Anemia 11. Atrial Flutter with RVR  Plan:   Intermittent hemodialysis treatment 6/28. Tolerated treatment.  Oliguric urine output responding to furosemide.  - Continue  furosemide 3m x 1 - No acute indication for dialysis today. Evaluate for dialysis need daily   LOS: [address] 6/30/20198:18 AM

## 2018-01-11 NOTE — Progress Notes (Signed)
Np in room to see patient. 1 time dose of labetalol ordered and will be administered. Will also have prn medication available since patient does not have a prn order at this time.

## 2018-01-11 NOTE — Consult Note (Signed)
Pharmacy Antibiotic Note  Ryan Webb is a 52 y.o. male admitted on 01-Aug-2017 with pneumonia and sepsis.  Pharmacy has been consulted for vancomycin/zosyn dosing. Patient with renal failure, was on CRRT now started on intermittent HD. Plans are for HD 4 hr 4 L today. And follow daily for HD needs. Pt now with fever, elevated WBC  Plan: Vancomycin 2000mg  once as a load. Then 1000mg  q HD session. Will need to follow closely for HD plan. Zosyn 3.375g q 12 hr  -evaluating daily for Hemodialysis  Height: 5\' 9"  (175.3 cm) Weight: (!) 311 lb 4.6 oz (141.2 kg) IBW/kg (Calculated) : 70.7  Temp (24hrs), Avg:99.9 F (37.7 C), Min:97.9 F (36.6 C), Max:101.3 F (38.5 C)  Recent Labs  Lab 01/05/18 0609  01/07/18 0811  01/07/18 1600  01/07/18 2359  01/08/18 0358 01/08/18 0822  01/08/18 1550  01/09/18 0342 01/09/18 0751 01/09/18 0945 01/09/18 2142 01/10/18 0228 01/11/18 0428  WBC  --    < >  --   --   --   --   --   --  11.7*  --   --   --   --  10.5  --  13.4*  --  15.3* 9.7  CREATININE  --    < > 1.26*   < > 1.13   < >  --    < >  --  1.22   < > 1.23   < > 1.18 1.18  --  1.44* 1.75* 2.49*  LATICACIDVEN  --    < > 1.7  --  1.4  --  1.8  --   --  1.8  --  1.4  --   --   --   --   --   --   --   VANCOTROUGH 7*  --   --   --   --   --   --   --   --   --   --   --   --   --   --   --   --   --   --    < > = values in this interval not displayed.    Estimated Creatinine Clearance: 49.1 mL/min (A) (by C-G formula based on SCr of 2.49 mg/dL (H)).    Allergies  Allergen Reactions  . No Known Allergies     Antimicrobials this admission: zosyn 6/23 >>  vancomycin 6/29 >>   Dose adjustments this admission:   Microbiology results: 6/22 BCx: NG 5 days 6/22 UCx: NG  6/22 Sputum: normal respiratory flora  6/22 MRSA PCR: neg  Thank you for allowing pharmacy to be a part of this patient's care.  Olene FlossMelissa D Maccia, Pharm.D, BCPS Clinical Pharmacist 01/11/2018 9:08 AM

## 2018-01-11 NOTE — Progress Notes (Signed)
[employer] [facility] - [place] at [facility]   PATIENT NAME: Ryan Webb    MR#:  [id number]  DATE OF BIRTH:  Dec 22, 1965  SUBJECTIVE:   Patient remains critically ill.  On vent, febrile, tachycardic repeat chest x-ray pending    REVIEW OF SYSTEMS:    unAble to obtain  tolerating Diet: Tube feeds        DRUG ALLERGIES:   Allergies  Allergen Reactions  . No Known Allergies     VITALS:  Blood pressure (!) 149/102, pulse (!) 133, temperature 98.9 F (37.2 C), temperature source Oral, resp. rate (!) 21, height 5\' 9"  (1.753 m), weight (!) 141.2 kg (311 lb 4.6 oz), SpO2 97 %.  PHYSICAL EXAMINATION:  Constitutional: Appears obese sedated on vent HENT: Normocephalic.  Intubated Eyes: , no scleral icterus.  Neck: Normal ROM. Neck supple. No JVD. No tracheal deviation. CVS: Tachycardic S1/S2 +, no murmurs, no gallops, no carotid bruit.  Pulmonary: Effort and breath sounds normal, no stridor, rhonchi, wheezes, rales.  Abdominal: Soft. BS +,  no distension, tenderness, rebound or guarding.  Musculoskeletal: sedated Neuro:sedated     . Skin: Skin is warm and dry. No rash noted.  2+ lower extremity edema Psychiatric: sedated    LABORATORY PANEL:   CBC Recent Labs  Lab 01/11/18 0428  WBC 9.7  HGB 8.9*  HCT 26.9*  PLT 124*   ------------------------------------------------------------------------------------------------------------------  Chemistries  Recent Labs  Lab 01/08/18 1223  01/11/18 0428  NA 136  135   < > 140  K 4.7  4.6   < > 3.2*  CL 103  102   < > 107  CO2 24  25   < > 25  GLUCOSE 122*  123*   < > 131*  BUN 36*  38*   < > 87*  CREATININE 1.22  1.29*   < > 2.49*  CALCIUM 8.6*  8.5*   < > 8.0*  MG 2.0   < > 2.4  AST 134*  --   --   ALT 833*  --   --   ALKPHOS 102  --   --   BILITOT 1.8*  --   --    < > = values in this interval not displayed.    ------------------------------------------------------------------------------------------------------------------  Cardiac Enzymes No results for input(s): TROPONINI in the last 168 hours. ------------------------------------------------------------------------------------------------------------------  RADIOLOGY:  Dg Chest Port 1 View  Result Date: 01/10/2018 CLINICAL DATA:  Acute respiratory distress. EXAM: PORTABLE CHEST 1 VIEW COMPARISON:  01/09/2018. FINDINGS: Endotracheal tube in satisfactory position. Right jugular catheter tip in the superior vena cava. Nasogastric tube extending into the stomach. Stable enlarged cardiac silhouette. Mild increase in bibasilar opacity. Thoracic spine degenerative changes. IMPRESSION: Increased bibasilar atelectasis or pneumonia with stable cardiomegaly and probable pleural effusions. Electronically Signed   By: Beckie Salts M.D.   On: 01/10/2018 07:34     ASSESSMENT AND PLAN:   52 year old male with obesity, diabetes, hypertension came in with tachyarrhythmia and was noted to be in septic shock  1.  Septic shock with multiorgan failure.- PNA Continue vent management as per ICU Sputum culture obtained from tracheal aspirate is positive for gram-positive cocci in clusters and gram-negative coccobacilli 6/22 Continue Zosyn, vancomycin empirically.  Follow-up on the cultures 6/29 Previous blood cultures obtained on 01/03/2018 are negative.  Urine culture obtained on 01/03/2018 is also negative MRSA PCR is negative  2.  Ectopic atrial tachycardia versus atrial flutter-remains tachycardic CT Angio of the chest and pelvis negative  for any PE or aortic dissection. Echocardiogram with LV ejection fraction of 45% which is stable from prior. Platelet  count is better and patient is started on heparin drip.  Monitor platelet count closely   3.  Acute renal failure due to ATN from septic shock and contrast exposure due to CT scans. Scheduled for  hemodialysis tomorrow, had hemodialysis  6/28.  CRRT discontinued Management per nephrology  4.  Thrombocytopenia due to sepsis: Platelet count is increasing. Hit panel negative Platelets at 1 24,000   5.  Acute respiratory failure: Due to sepsis and multiorgan failure Remains on ventilator and management per ICU team   6.  Elevated LFTs due to shock liver with multiorgan failure due to sepsis.  LFTs are improving GI consultation appreciated  7.  Nutrition: Continue tube feeds Overall prognosis guarded.  Patient is critically ill.  Poor prognosis with high mortality and morbidity   CODE STATUS: FULL  TOTAL TIME TAKING CARE OF THIS PATIENT: 25 minutes.     POSSIBLE D/C ??, DEPENDING ON CLINICAL CONDITION.   Ramonita LabAruna Tasharra Nodine M.D on 01/11/2018 at 12:55 PM  Between 7am to 6pm - Pager - [phone] After 6pm go to [email or web address] - password [email or web address]  [facility]  Office  [phone]  CC: Primary care physician; Anola Gurneyhauvin, Robert, PA  Note: This dictation was prepared with Dragon dictation along with smaller phrase technology. Any transcriptional errors that result from this process are unintentional.

## 2018-01-11 NOTE — Progress Notes (Signed)
ANTICOAGULATION CONSULT NOTE - Follow Up Consult  Pharmacy Consult for Heparin Drip Indication: atrial fibrillation  Allergies  Allergen Reactions  . No Known Allergies     Patient Measurements: Height: 5\' 9"  (175.3 cm) Weight: (!) 311 lb 4.6 oz (141.2 kg) IBW/kg (Calculated) : 70.7 Heparin Dosing Weight: 98.9 kg  Vital Signs: Temp: 98 F (36.7 C) (06/30 0400) Temp Source: Oral (06/30 0400) BP: 91/64 (06/29 1900) Pulse Rate: 124 (06/30 0221)  Labs: Recent Labs    01/08/18 1223  01/09/18 0342  01/09/18 0945  01/09/18 2142 01/10/18 0012 01/10/18 0228 01/11/18 0428  HGB  --    < > 9.9*  --  10.1*  --   --   --  9.9* 8.9*  HCT  --    < > 31.1*  --  31.9*  --   --   --  31.8* 26.9*  PLT  --    < > 67*  --  76*  --   --   --  116* 124*  APTT  --   --  88*  --   --   --   --   --   --   --   LABPROT 37.8*  --   --   --   --   --   --   --   --   --   INR 3.88  --   --   --   --   --   --   --   --   --   HEPARINUNFRC  --   --   --   --   --    < >  --  <0.10* 0.43 0.65  CREATININE 1.22  1.29*   < > 1.18   < >  --   --  1.44*  --  1.75* 2.49*   < > = values in this interval not displayed.    Estimated Creatinine Clearance: 49.1 mL/min (A) (by C-G formula based on SCr of 2.49 mg/dL (H)).   Assessment: Patient transitioned over to Heparin drip today. Currently infusing at 1700 units/hr.  6/28 1800 Heparin level resulted at 0.30  Goal of Therapy:  Heparin level 0.3-0.7 units/ml Monitor platelets by anticoagulation protocol: Yes   Plan:  Will continue with current rate and check a confirmatory level in 6 hours.  6/29: HL @ 00:00 < 0.1 , this does not appear to be a valid line drawn, will repeat lab 6/29: HL @ 0228 = 0.43.  Will continue this pt on current rate and recheck HL on 6/30 with AM labs.  6/30: HL @ 0428 = 0.65.  Will continue this pt on current rate and recheck HL on 7/1 with AM labs.   Treon Kehl D  01/11/2018 5:51 AM

## 2018-01-11 NOTE — Progress Notes (Signed)
   01/11/18 1945  Clinical Encounter Type  Visited With Family  Visit Type Follow-up  Spiritual Encounters  Spiritual Needs Emotional   Patient's spouse, mother, and additional family in waiting room.  Spouse shared thoughts and feelings regarding patient's status over the past week.  She reported that they got some 'good news' today and contrasted that to other reports from the past week.  Spouse asked about the role of the palliative care team, said someone had mentioned it to her.  Chaplain provided overview of the role of the palliative care team and encouraged spouse to talk to patient's nurse and/or physician if that is something she would like to explore for the patient and their family.

## 2018-01-12 ENCOUNTER — Other Ambulatory Visit: Payer: Self-pay | Admitting: Family Medicine

## 2018-01-12 ENCOUNTER — Inpatient Hospital Stay: Payer: BLUE CROSS/BLUE SHIELD

## 2018-01-12 DIAGNOSIS — K219 Gastro-esophageal reflux disease without esophagitis: Secondary | ICD-10-CM

## 2018-01-12 LAB — GLUCOSE, CAPILLARY
GLUCOSE-CAPILLARY: 165 mg/dL — AB (ref 70–99)
Glucose-Capillary: 197 mg/dL — ABNORMAL HIGH (ref 70–99)
Glucose-Capillary: 200 mg/dL — ABNORMAL HIGH (ref 70–99)
Glucose-Capillary: 174 mg/dL — ABNORMAL HIGH (ref 70–99)
Glucose-Capillary: 166 mg/dL — ABNORMAL HIGH (ref 70–99)

## 2018-01-12 LAB — BLOOD GAS, ARTERIAL
ACID-BASE EXCESS: 3.3 mmol/L — AB (ref 0.0–2.0)
Acid-Base Excess: 1 mmol/L (ref 0.0–2.0)
BICARBONATE: 27.8 mmol/L (ref 20.0–28.0)
Bicarbonate: 27.8 mmol/L (ref 20.0–28.0)
FIO2: 0.4
FIO2: 1
LHR: 25 {breaths}/min
LHR: 25 {breaths}/min
MECHVT: 500 mL
MECHVT: 500 mL
O2 SAT: 99.9 %
O2 Saturation: 97 %
PATIENT TEMPERATURE: 37.9
PCO2 ART: 41 mmHg (ref 32.0–48.0)
PEEP/CPAP: 5 cmH2O
PEEP/CPAP: 10 cmH2O
PH ART: 7.44 (ref 7.350–7.450)
PO2 ART: 103 mmHg (ref 83.0–108.0)
Patient temperature: 37
pCO2 arterial: 54 mmHg — ABNORMAL HIGH (ref 32.0–48.0)
pH, Arterial: 7.31 — ABNORMAL LOW (ref 7.350–7.450)
pO2, Arterial: 278 mmHg — ABNORMAL HIGH (ref 83.0–108.0)

## 2018-01-12 LAB — RENAL FUNCTION PANEL
Albumin: 3.5 g/dL (ref 3.5–5.0)
Anion gap: 10 (ref 5–15)
BUN: 98 mg/dL — ABNORMAL HIGH (ref 6–20)
CHLORIDE: 105 mmol/L (ref 98–111)
CO2: 29 mmol/L (ref 22–32)
CREATININE: 2.24 mg/dL — AB (ref 0.61–1.24)
Calcium: 8.2 mg/dL — ABNORMAL LOW (ref 8.9–10.3)
GFR calc Af Amer: 37 mL/min — ABNORMAL LOW (ref >60)
GFR, EST NON AFRICAN AMERICAN: 32 mL/min — AB (ref >60)
GLUCOSE: 213 mg/dL — AB (ref 70–99)
Phosphorus: 6.2 mg/dL — ABNORMAL HIGH (ref 2.5–4.6)
Potassium: 3.6 mmol/L (ref 3.5–5.1)
Sodium: 144 mmol/L (ref 135–145)

## 2018-01-12 LAB — MAGNESIUM: Magnesium: 2.2 mg/dL (ref 1.7–2.4)

## 2018-01-12 LAB — PROCALCITONIN: Procalcitonin: 2.22 ng/mL

## 2018-01-12 LAB — TRIGLYCERIDES: Triglycerides: 105 mg/dL (ref <150)

## 2018-01-12 LAB — HEPARIN LEVEL (UNFRACTIONATED): Heparin Unfractionated: 0.68 IU/mL (ref 0.30–0.70)

## 2018-01-12 MED ORDER — INSULIN ASPART 100 UNIT/ML ~~LOC~~ SOLN
3.0000 [IU] | SUBCUTANEOUS | Status: DC
  Administered 2018-01-12 – 2018-01-14 (×13): 3 [IU] via SUBCUTANEOUS
  Filled 2018-01-12 (×13): qty 1

## 2018-01-12 MED ORDER — MIDAZOLAM HCL 2 MG/2ML IJ SOLN
2.0000 mg | INTRAMUSCULAR | Status: DC | PRN
  Administered 2018-01-15 – 2018-01-16 (×3): 2 mg via INTRAVENOUS
  Filled 2018-01-12 (×4): qty 2

## 2018-01-12 MED ORDER — SENNOSIDES 8.8 MG/5ML PO SYRP
5.0000 mL | ORAL_SOLUTION | Freq: Two times a day (BID) | ORAL | Status: DC
  Administered 2018-01-12 – 2018-01-17 (×11): 5 mL
  Filled 2018-01-12 (×13): qty 5

## 2018-01-12 MED ORDER — FENTANYL CITRATE (PF) 100 MCG/2ML IJ SOLN: 100.0000 ug | Freq: Once | INTRAMUSCULAR | Status: AC

## 2018-01-12 MED ORDER — MIDAZOLAM HCL 2 MG/2ML IJ SOLN
2.0000 mg | Freq: Once | INTRAMUSCULAR | Status: AC
Start: 2018-01-12 — End: 2018-01-12

## 2018-01-12 MED ORDER — FUROSEMIDE 10 MG/ML IJ SOLN: 40.0000 mg | Freq: Once | INTRAMUSCULAR | Status: DC

## 2018-01-12 MED ORDER — LABETALOL HCL 5 MG/ML IV SOLN
10.0000 mg | INTRAVENOUS | Status: DC | PRN
  Administered 2018-01-12 – 2018-01-20 (×5): 10 mg via INTRAVENOUS
  Filled 2018-01-12 (×5): qty 4

## 2018-01-12 MED ORDER — FENTANYL BOLUS VIA INFUSION
50.0000 ug | INTRAVENOUS | Status: DC | PRN
  Administered 2018-01-15: 50 ug via INTRAVENOUS
  Administered 2018-01-15 (×2): 100 ug via INTRAVENOUS
  Filled 2018-01-12: qty 100

## 2018-01-12 MED ORDER — MIDAZOLAM HCL 2 MG/2ML IJ SOLN
2.0000 mg | INTRAMUSCULAR | Status: AC | PRN
  Administered 2018-01-12 – 2018-01-15 (×3): 2 mg via INTRAVENOUS
  Filled 2018-01-12 (×2): qty 2

## 2018-01-12 MED ORDER — MIDAZOLAM HCL 2 MG/2ML IJ SOLN: 2.0000 mg | Freq: Once | INTRAMUSCULAR | Status: AC

## 2018-01-12 MED ORDER — FUROSEMIDE 10 MG/ML IJ SOLN
40.0000 mg | Freq: Two times a day (BID) | INTRAMUSCULAR | Status: DC
  Administered 2018-01-13 – 2018-01-14 (×3): 40 mg via INTRAVENOUS
  Filled 2018-01-12 (×3): qty 4

## 2018-01-12 MED ORDER — MIDAZOLAM HCL 2 MG/2ML IJ SOLN
2.0000 mg | Freq: Once | INTRAMUSCULAR | Status: AC
  Administered 2018-01-12: 2 mg via INTRAVENOUS
  Filled 2018-01-12: qty 2

## 2018-01-12 MED ORDER — BISACODYL 10 MG RE SUPP
10.0000 mg | Freq: Every day | RECTAL | Status: DC | PRN
  Administered 2018-01-16: 10 mg via RECTAL
  Filled 2018-01-12 (×2): qty 1

## 2018-01-12 MED ORDER — PROPOFOL 1000 MG/100ML IV EMUL
5.0000 ug/kg/min | INTRAVENOUS | Status: DC
  Administered 2018-01-12 – 2018-01-13 (×5): 20 ug/kg/min via INTRAVENOUS
  Administered 2018-01-13: 10 ug/kg/min via INTRAVENOUS
  Administered 2018-01-14: 40 ug/kg/min via INTRAVENOUS
  Filled 2018-01-12 (×7): qty 100

## 2018-01-12 MED ORDER — MIDAZOLAM HCL 2 MG/2ML IJ SOLN
INTRAMUSCULAR | Status: AC
  Filled 2018-01-12: qty 2

## 2018-01-12 MED ORDER — FENTANYL CITRATE (PF) 100 MCG/2ML IJ SOLN: 50.0000 ug | Freq: Once | INTRAMUSCULAR | Status: DC

## 2018-01-12 MED ORDER — DOCUSATE SODIUM 50 MG/5ML PO LIQD
100.0000 mg | Freq: Two times a day (BID) | ORAL | Status: DC
  Administered 2018-01-12 – 2018-01-17 (×11): 100 mg
  Filled 2018-01-12 (×11): qty 10

## 2018-01-12 MED ORDER — VECURONIUM BROMIDE 10 MG IV SOLR
10.0000 mg | Freq: Once | INTRAVENOUS | Status: AC
  Administered 2018-01-12: 10 mg via INTRAVENOUS

## 2018-01-12 MED ORDER — IPRATROPIUM-ALBUTEROL 0.5-2.5 (3) MG/3ML IN SOLN
3.0000 mL | RESPIRATORY_TRACT | Status: DC | PRN
  Administered 2018-01-12: 3 mL via RESPIRATORY_TRACT

## 2018-01-12 MED ORDER — FENTANYL CITRATE (PF) 100 MCG/2ML IJ SOLN
50.0000 ug | INTRAMUSCULAR | Status: DC | PRN
  Administered 2018-01-12 (×4): 100 ug via INTRAVENOUS
  Filled 2018-01-12 (×4): qty 2

## 2018-01-12 MED ORDER — FENTANYL 2500MCG IN NS 250ML (10MCG/ML) PREMIX INFUSION
25.0000 ug/h | INTRAVENOUS | Status: DC
  Administered 2018-01-12: 50 ug/h via INTRAVENOUS
  Administered 2018-01-13 – 2018-01-14 (×2): 75 ug/h via INTRAVENOUS
  Administered 2018-01-15 – 2018-01-18 (×10): 350 ug/h via INTRAVENOUS
  Administered 2018-01-19: 100 ug/h via INTRAVENOUS
  Administered 2018-01-19: 350 ug/h via INTRAVENOUS
  Administered 2018-01-20: 300 ug/h via INTRAVENOUS
  Administered 2018-01-21: 200 ug/h via INTRAVENOUS
  Administered 2018-01-21: 21:00:00 30 ug/h via INTRAVENOUS
  Administered 2018-01-21: 350 ug/h via INTRAVENOUS
  Administered 2018-01-22: 40 ug/h via INTRAVENOUS
  Filled 2018-01-12 (×23): qty 250

## 2018-01-12 NOTE — Progress Notes (Signed)
CRITICAL CARE NOTE  CC  follow up respiratory failure  SUBJECTIVE Patient remains critically ill Prognosis is guarded Patient had severe hypoxic events last night ETT changed out He bit through ETT Patient was given paralytics Bite block placed fio2 at 60% this AM       SIGNIFICANT EVENTS  STUDIES: 6/22 ECHO-EF 45-50%  CULTURES: MRSA PCR- Neg  ANTIBIOTICS: 6/22Zosyn; Vancomycin 6/24 Vancomycin D/C  SIGNIFICANT EVENTS: 6/21 admission to hospital 6/22 transfer to ICU 7/1 ETT changed out   LINES/TUBES: 6/22 Foley 6/22 R-IJ TLC, R- Femoral hemodialysis cath 6/22 L- Femoral Arterial line 6/22 Endotracheal tube 6/26 failed wean off paralytics 7/1 ETT changed due to damage     BP (!) 155/87   Pulse (!) 135   Temp (!) 102.5 F (39.2 C) (Axillary)   Resp (!) 25   Ht 5' 9"  (1.753 m)   Wt (!) 303 lb 12.7 oz (137.8 kg)   SpO2 95%   BMI 44.86 kg/m    REVIEW OF SYSTEMS  PATIENT IS UNABLE TO PROVIDE COMPLETE REVIEW OF SYSTEM S DUE TO SEVERE CRITICAL ILLNESS AND ENCEPHALOPATHY   PHYSICAL EXAMINATION:  GENERAL:critically ill appearing, +resp distress HEAD: Normocephalic, atraumatic.  EYES: Pupils equal, round, reactive to light.  No scleral icterus.  MOUTH: Moist mucosal membrane. NECK: Supple. No thyromegaly. No nodules. No JVD.  PULMONARY: +rhonchi, +wheezing CARDIOVASCULAR: S1 and S2. Regular rate and rhythm. No murmurs, rubs, or gallops.  GASTROINTESTINAL: Soft, nontender, -distended. No masses. Positive bowel sounds. No hepatosplenomegaly.  MUSCULOSKELETAL: No swelling, clubbing, or edema.  NEUROLOGIC: obtunded, GCS<8 SKIN:intact,warm,dry  ASSESSMENT AND PLAN 52 yo morbidly obese white male with progressive resp failure with severe septic shock from probable acute viral syndrome in setting of metformin toxicity and sever acidosis, complicated by acute renal failure with difficulty weaning from vent   Severe Hypoxic and Hypercapnic  Respiratory Failure -restarted ABX  -continue Full MV support -continue Bronchodilator Therapy -Wean Fio2 and PEEP as tolerated -will perform SAT/SBt when respiratory parameters are met   Renal Failure-most likely due to ATN -follow chem 7 -follow UO -continue Foley Catheter-assess need IHD as per nephrology   NEUROLOGY - intubated and sedated - minimal sedation to achieve a RASS goal: -1   Septic shock -use vasopressors to keep MAP>65 as needed -follow up cultures -emperic ABX   CARDIAC ICU monitoring  ID -continue IV abx as prescibed -follow up cultures   DVT/GI PRX ordered TRANSFUSIONS AS NEEDED MONITOR FSBS ASSESS the need for LABS as needed   Critical Care Time devoted to patient care services described in this note is 45 minutes.   Overall, patient is critically ill, prognosis is guarded.  Patient with Multiorgan failure and at high risk for cardiac arrest and death.   I anticipate prolonged ICU LOS and anticipate trach and PEG tube for survival.    Corrin Parker, M.D.  [facility]  Medical Director [place] Director [facility] Cardio-Pulmonary Department

## 2018-01-12 NOTE — Progress Notes (Signed)
RT called to room by charge nurse for pt desat. RN found bagging pt, RN stated pt desat into 70's on vent. Pt suctioned with no increase in sats. Multiple attempts to place pt back on vent including increasing FiO2 and PEEP to 10 with no positive increase in sat. Pt had noticeable leak from ETT. ETT changed with use of bouge, NP at bedside, with no difficulties, placed at 23 at lip as previous ETT. CXR confirmed placement. Green bite block placed to prevent biting of tube

## 2018-01-12 NOTE — Progress Notes (Signed)
ANTICOAGULATION CONSULT NOTE - Follow Up Consult  Pharmacy Consult for Heparin Drip Indication: atrial fibrillation  Allergies  Allergen Reactions  . No Known Allergies     Patient Measurements: Height: 5\' 9"  (175.3 cm) Weight: (!) 303 lb 12.7 oz (137.8 kg) IBW/kg (Calculated) : 70.7 Heparin Dosing Weight: 98.9 kg  Vital Signs: Temp: 100.1 F (37.8 C) (07/01 0400) Temp Source: Axillary (07/01 0400) BP: 145/89 (07/01 0400) Pulse Rate: 135 (07/01 0534)  Labs: Recent Labs    01/09/18 0945  01/10/18 0228 01/11/18 0428 01/12/18 0535  HGB 10.1*  --  9.9* 8.9*  --   HCT 31.9*  --  31.8* 26.9*  --   PLT 76*  --  116* 124*  --   HEPARINUNFRC  --    < > 0.43 0.65 0.68  CREATININE  --    < > 1.75* 2.49* 2.24*   < > = values in this interval not displayed.    Estimated Creatinine Clearance: 53.8 mL/min (A) (by C-G formula based on SCr of 2.24 mg/dL (H)).   Assessment: Patient transitioned over to Heparin drip today. Currently infusing at 1700 units/hr.  6/28 1800 Heparin level resulted at 0.30  Goal of Therapy:  Heparin level 0.3-0.7 units/ml Monitor platelets by anticoagulation protocol: Yes   Plan:  Will continue with current rate and check a confirmatory level in 6 hours.  6/29: HL @ 00:00 < 0.1 , this does not appear to be a valid line drawn, will repeat lab 6/29: HL @ 0228 = 0.43.  Will continue this pt on current rate and recheck HL on 6/30 with AM labs.  6/30: HL @ 0428 = 0.65.  Will continue this pt on current rate and recheck HL on 7/1 with AM labs.  7/01: HL @ 0535 = 0.68.  Will continue this pt on current rate and recheck HL on 7/2 with AM labs.   Tyrihanna Wingert D  01/12/2018 6:10 AM

## 2018-01-12 NOTE — Progress Notes (Signed)
[employer] - [place] at [facility]   PATIENT NAME: Ryan Webb    MR#:  [id number]  DATE OF BIRTH:  09/26/1965  SUBJECTIVE:   Patient remains critically ill.  On vent, febrile, tachycardic heart rate around 140s today.Pt bit through ETT last night and ETT was changed today morning by intensivist  REVIEW OF SYSTEMS:    unAble to obtain  tolerating Diet: Tube feeds        DRUG ALLERGIES:   Allergies  Allergen Reactions  . No Known Allergies     VITALS:  Blood pressure (!) 148/80, pulse (!) 136, temperature (!) 103 F (39.4 C), temperature source Axillary, resp. rate (!) 25, height 5\' 9"  (1.753 m), weight (!) 137.8 kg (303 lb 12.7 oz), SpO2 93 %.  PHYSICAL EXAMINATION:  Constitutional: Appears obese sedated on vent HENT: Normocephalic.  Intubated Eyes: , no scleral icterus.  Neck: Normal ROM. Neck supple. No JVD. No tracheal deviation. CVS: Tachycardic S1/S2 +, no murmurs, no gallops, no carotid bruit.  Pulmonary: Effort and breath sounds normal, no stridor, rhonchi, wheezes, rales.  Abdominal: Soft. BS +,  no distension, tenderness, rebound or guarding.  Musculoskeletal: sedated Neuro:sedated     . Skin: Skin is warm and dry. No rash noted.  2+ lower extremity edema Psychiatric: sedated    LABORATORY PANEL:   CBC Recent Labs  Lab 01/11/18 0428  WBC 9.7  HGB 8.9*  HCT 26.9*  PLT 124*   ------------------------------------------------------------------------------------------------------------------  Chemistries  Recent Labs  Lab 01/08/18 1223  01/12/18 0535  NA 136  135   < > 144  K 4.7  4.6   < > 3.6  CL 103  102   < > 105  CO2 24  25   < > 29  GLUCOSE 122*  123*   < > 213*  BUN 36*  38*   < > 98*  CREATININE 1.22  1.29*   < > 2.24*  CALCIUM 8.6*  8.5*   < > 8.2*  MG 2.0   < > 2.2  AST 134*  --   --   ALT 833*  --   --   ALKPHOS 102  --   --   BILITOT 1.8*  --   --    < > = values in this interval not displayed.    ------------------------------------------------------------------------------------------------------------------  Cardiac Enzymes No results for input(s): TROPONINI in the last 168 hours. ------------------------------------------------------------------------------------------------------------------  RADIOLOGY:  Dg Abd 1 View  Result Date: 01/12/2018 CLINICAL DATA:  52 year old male with NG tube placement. EXAM: PORTABLE CHEST 1 VIEW COMPARISON:  CT of the abdomen pelvis dated Jun 28, 2018 and chest radiograph dated 01/11/2018 FINDINGS: An endotracheal tube is noted with tip approximately 5 cm above the carina. An enteric tube extends into the left hemiabdomen with tip to the left of the L1 spine over the gastric air likely in the mid body of the stomach. Right IJ central venous line with tip over central SVC. There is cardiomegaly with small bilateral pleural effusions and associated atelectatic changes. Pneumonia is not excluded. Clinical correlation is recommended. There is no pneumothorax. No acute osseous pathology. IMPRESSION: 1. Cardiomegaly with small bilateral pleural effusions and associated atelectatic changes. 2. Support devices as described. Electronically Signed   By: Elgie CollardArash  Radparvar M.D.   On: 01/12/2018 05:56   Dg Chest Port 1 View  Result Date: 01/12/2018 CLINICAL DATA:  52 year old male with NG tube placement. EXAM: PORTABLE CHEST 1 VIEW COMPARISON:  CT of the abdomen  pelvis dated 2018/01/05 and chest radiograph dated 01/11/2018 FINDINGS: An endotracheal tube is noted with tip approximately 5 cm above the carina. An enteric tube extends into the left hemiabdomen with tip to the left of the L1 spine over the gastric air likely in the mid body of the stomach. Right IJ central venous line with tip over central SVC. There is cardiomegaly with small bilateral pleural effusions and associated atelectatic changes. Pneumonia is not excluded. Clinical correlation is recommended. There is  no pneumothorax. No acute osseous pathology. IMPRESSION: 1. Cardiomegaly with small bilateral pleural effusions and associated atelectatic changes. 2. Support devices as described. Electronically Signed   By: Elgie Collard M.D.   On: 01/12/2018 05:56   Dg Chest Port 1 View  Result Date: 01/11/2018 CLINICAL DATA:  Fever EXAM: PORTABLE CHEST 1 VIEW COMPARISON:  Test radiograph 01/10/2018 FINDINGS: ET tube terminates in the mid trachea. Enteric tube courses inferior to the diaphragm. Right IJ central venous catheter tip projects over the superior vena cava. Stable cardiomegaly. Interval increase in bilateral airspace opacities. Small bilateral pleural effusions. IMPRESSION: Stable support apparatus. Cardiomegaly. Similar-appearing diffuse bilateral airspace opacities. Small layering bilateral effusions. Electronically Signed   By: Annia Belt M.D.   On: 01/11/2018 14:52     ASSESSMENT AND PLAN:   52 year old male with obesity, diabetes, hypertension came in with tachyarrhythmia and was noted to be in septic shock  1.  Septic shock with multiorgan failure.- PNA Continue vent management as per ICU Sputum culture obtained from tracheal aspirate is positive for gram-positive cocci in clusters and gram-negative coccobacilli 6/22 Continue Zosyn, vancomycin empirically.  Follow-up on the cultures 6/29 Previous blood cultures obtained on 01/03/2018 are negative.  Urine culture obtained on 01/03/2018 is also negative MRSA PCR is negative  2.  Ectopic atrial tachycardia versus atrial flutter-remains tachycardic CT Angio of the chest and pelvis negative for any PE or aortic dissection. Echocardiogram with LV ejection fraction of 45% which is stable from prior. Platelet  count is better and patient is started on heparin drip.  Monitor platelet count closely   3.  Acute renal failure due to ATN from septic shock and contrast exposure due to CT scans. Scheduled for hemodialysis tomorrow, had hemodialysis   6/28.  CRRT discontinued Management per nephrology  4.  Thrombocytopenia due to sepsis: Platelet count is increasing. Hit panel negative Last Platelets at 1 24,000   5.  Acute respiratory failure: Due to sepsis and multiorgan failure Remains on ventilator and management per ICU team   6.  Elevated LFTs due to shock liver with multiorgan failure due to sepsis.  LFTs are improving GI consultation appreciated  7.  Nutrition: Continue tube feeds- vital Overall prognosis guarded.  Patient is critically ill.  Poor prognosis with high mortality and morbidity   CODE STATUS: FULL  TOTAL TIME TAKING CARE OF THIS PATIENT: 25 minutes.     POSSIBLE D/C ??, DEPENDING ON CLINICAL CONDITION.   Ramonita Lab M.D on 01/12/2018 at 4:04 PM  Between 7am to 6pm - Pager - [phone] After 6pm go to [email or web address] - password [email or web address]  [facility]  Office  [phone]  CC: Primary care physician; Anola Gurney, PA  Note: This dictation was prepared with Dragon dictation along with smaller phrase technology. Any transcriptional errors that result from this process are unintentional.

## 2018-01-12 NOTE — Progress Notes (Signed)
[facility], [place] 01/12/18  Subjective:   Patient was getting spontaneous breathing trials yesterday but overnight he got agitated and bit through his ET tube.  A bite block was placed.  Patient then had to be sedated and paralyzed This morning FiO2 is 45%, patient remains tachycardic Urine output has improved to 5900 cc CVP 23 cm on the monitor  Objective:  Vital signs in last 24 hours:  Temp:  [98.2 F (36.8 C)-102.5 F (39.2 C)] 102.5 F (39.2 C) (07/01 0800) Pulse Rate:  [131-138] 135 (07/01 0800) Resp:  [19-42] 25 (07/01 0800) BP: (129-162)/(83-107) 155/87 (07/01 0700) SpO2:  [82 %-98 %] 95 % (07/01 0832) FiO2 (%):  [40 %-100 %] 45 % (07/01 0833) Weight:  [137.8 kg (303 lb 12.7 oz)] 137.8 kg (303 lb 12.7 oz) (07/01 0240)  Weight change: -3.4 kg (-7 lb 7.9 oz) Filed Weights   01/09/18 1225 01/11/18 0437 01/12/18 0240  Weight: (!) 147.3 kg (324 lb 11.8 oz) (!) 141.2 kg (311 lb 4.6 oz) (!) 137.8 kg (303 lb 12.7 oz)    Intake/Output:    Intake/Output Summary (Last 24 hours) at 01/12/2018 0902 Last data filed at 01/12/2018 0800 Gross per 24 hour  Intake 9424.75 ml  Output 5450 ml  Net 3974.75 ml     Physical Exam: General:  Critically ill-appearing, laying in the bed  HEENT  small conjunctival hemorrhage b/l, ET tube in place  Pulm/lungs  ventilator assisted, FiO2 45%  CVS/Heart  sinus tachycardia in the 130s  Abdomen:   Soft, nontender, nondistended, scrotal edema  Extremities:  2-3+ dependent pitting edema  Neurologic:  Sedated  Skin:  No acute rashes  Access: Rt femoral vascath   Foley catheter in place    Basic Metabolic Panel:  Recent Labs  Lab 01/09/18 0342 01/09/18 0751 01/09/18 2142 01/10/18 0228 01/11/18 0428 01/12/18 0535  NA 134* 135 137 137 140 144  K 4.5 4.2 4.2 4.4 3.2* 3.6  CL 103 104 105 104 107 105  CO2 22 24 23 24 25 29   GLUCOSE 152* 141* 141* 154* 131* 213*  BUN 39* 38* 44* 54* 87* 98*  CREATININE 1.18  1.18 1.44* 1.75* 2.49* 2.24*  CALCIUM 8.5* 8.3* 8.3* 8.4* 8.0* 8.2*  MG 2.1  --  2.2 2.3 2.4 2.2  PHOS 3.5 2.9 3.6 4.1 5.3* 6.2*     CBC: Recent Labs  Lab 01/07/18 0334 01/08/18 0358 01/09/18 0342 01/09/18 0945 01/10/18 0228 01/11/18 0428  WBC 9.9 11.7* 10.5 13.4* 15.3* 9.7  NEUTROABS 8.2*  --   --  10.5*  --   --   HGB 9.4* 9.3* 9.9* 10.1* 9.9* 8.9*  HCT 29.0* 29.6* 31.1* 31.9* 31.8* 26.9*  MCV 102.0* 103.7* 103.1* 104.6* 103.8* 102.7*  PLT 41* 54* 67* 76* 116* 124*      Lab Results  Component Value Date   HEPBSAG Negative 01/09/2018   HEPBSAB Non Reactive 01/09/2018   HEPBIGM Negative 01/03/2018      Microbiology:  Recent Results (from the past 240 hour(s))  Culture, blood (routine x 2)     Status: None   Collection Time: 01/03/18 10:42 AM  Result Value Ref Range Status   Specimen Description BLOOD LT [facility]  Final   Special Requests   Final    BOTTLES DRAWN AEROBIC AND ANAEROBIC Blood Culture adequate volume   Culture   Final    NO GROWTH 5 DAYS Performed at [facility], [address] [place], [place]  [address]    Report Status 01/08/2018 FINAL  Final  MRSA PCR Screening     Status: None   Collection Time: 01/03/18 10:56 AM  Result Value Ref Range Status   MRSA by PCR NEGATIVE NEGATIVE Final    Comment:        The GeneXpert MRSA Assay (FDA approved for NASAL specimens only), is one component of a comprehensive MRSA colonization surveillance program. It is not intended to diagnose MRSA infection nor to guide or monitor treatment for MRSA infections. Performed at [facility], [address] [place], [place] [address]   CULTURE, BLOOD (ROUTINE X 2) w Reflex to ID Panel     Status: None   Collection Time: 01/03/18  1:25 PM  Result Value Ref Range Status   Specimen Description BLOOD LINE  Final   Special Requests   Final    BOTTLES DRAWN AEROBIC AND ANAEROBIC Blood Culture adequate volume   Culture   Final    NO GROWTH 5  DAYS Performed at [facility], [address] [place], [place] [address]    Report Status 01/08/2018 FINAL  Final  Culture, Urine     Status: None   Collection Time: 01/03/18  4:49 PM  Result Value Ref Range Status   Specimen Description   Final    URINE, RANDOM Performed at [facility], [address] [place], [place] [address]    Special Requests   Final    NONE Performed at [facility], [address], [place], [place] [address]    Culture   Final    NO GROWTH Performed at [facility], [place] [address], [place], [place] [address]    Report Status 01/04/2018 FINAL  Final  Culture, expectorated sputum-assessment     Status: None   Collection Time: 01/03/18  5:53 PM  Result Value Ref Range Status   Specimen Description TRACHEAL ASPIRATE  Final   Special Requests NONE  Final   Sputum evaluation   Final    THIS SPECIMEN IS ACCEPTABLE FOR SPUTUM CULTURE Performed at [facility], [address], [place], [place] [address]    Report Status 01/03/2018 FINAL  Final  Culture, respiratory (NON-Expectorated)     Status: None   Collection Time: 01/03/18  5:53 PM  Result Value Ref Range Status   Specimen Description   Final    TRACHEAL ASPIRATE Performed at [facility], [address] [place], [place] [address]    Special Requests   Final    NONE Reflexed from [id number] Performed at [facility], [address] [place],  [address]    Gram Stain   Final    FEW WBC PRESENT, PREDOMINANTLY PMN RARE SQUAMOUS EPITHELIAL CELLS PRESENT FEW GRAM POSITIVE COCCI IN CLUSTERS FEW GRAM NEGATIVE COCCOBACILLI    Culture   Final    Consistent with normal respiratory flora. Performed at [facility], [place] [address], [place],  [address]    Report Status 01/06/2018 FINAL  Final  CULTURE, BLOOD (ROUTINE X 2) w Reflex to ID Panel     Status: None (Preliminary result)   Collection Time: 01/10/18  9:35 AM   Result Value Ref Range Status   Specimen Description BLOOD L WRIST  Final   Special Requests   Final    BOTTLES DRAWN AEROBIC AND ANAEROBIC Blood Culture adequate volume   Culture   Final    NO GROWTH 2 DAYS Performed at [facility],  [place], [place] [address]    Report Status PENDING  Incomplete  CULTURE, BLOOD (ROUTINE X 2) w Reflex to ID Panel     Status: None (Preliminary result)   Collection Time: 01/10/18  9:47 AM  Result Value Ref Range Status   Specimen Description BLOOD R FOREARM  Final   Special Requests   Final    BOTTLES DRAWN AEROBIC AND ANAEROBIC Blood Culture adequate volume   Culture   Final    NO GROWTH 2 DAYS Performed at [facility], [address] [place], [place] [address]    Report Status PENDING  Incomplete  Culture, respiratory (NON-Expectorated)     Status: None (Preliminary result)   Collection Time: 01/10/18 12:03 PM  Result Value Ref Range Status   Specimen Description   Final    TRACHEAL ASPIRATE Performed at [facility], [address], [place], [place] [address]    Special Requests   Final    NONE Performed at [facility], [address] [place], [place] [address]    Gram Stain   Final    ABUNDANT WBC PRESENT,BOTH PMN AND MONONUCLEAR RARE SQUAMOUS EPITHELIAL CELLS PRESENT RARE BUDDING YEAST SEEN    Culture   Final    TOO YOUNG TO READ Performed at [facility], [place] [address] [place], [place] [address]    Report Status PENDING  Incomplete    Coagulation Studies: No results for input(s): LABPROT, INR in the last 72 hours.  Urinalysis: Recent Labs    01/10/18 1016  COLORURINE AMBER*  LABSPEC 1.016  PHURINE 5.0  GLUCOSEU NEGATIVE  HGBUR NEGATIVE  BILIRUBINUR NEGATIVE  KETONESUR NEGATIVE  PROTEINUR 100*  NITRITE NEGATIVE  LEUKOCYTESUR NEGATIVE      Imaging: Dg Abd 1 View  Result Date: 01/12/2018 CLINICAL DATA:  52 year old male with NG tube placement.  EXAM: PORTABLE CHEST 1 VIEW COMPARISON:  CT of the abdomen pelvis dated 12/13/2017 and chest radiograph dated 01/11/2018 FINDINGS: An endotracheal tube is noted with tip approximately 5 cm above the carina. An enteric tube extends into the left hemiabdomen with tip to the left of the L1 spine over the gastric air likely in the mid body of the stomach. Right IJ central venous line with tip over central SVC. There is cardiomegaly with small bilateral pleural effusions and associated atelectatic changes. Pneumonia is not excluded. Clinical correlation is recommended. There is no pneumothorax. No acute osseous pathology. IMPRESSION: 1. Cardiomegaly with small bilateral pleural effusions and associated atelectatic changes. 2. Support devices as described. Electronically Signed   By: Anner Crete M.D.   On: 01/12/2018 05:56   Dg Chest Port 1 View  Result Date: 01/12/2018 CLINICAL DATA:  52 year old male with NG tube placement. EXAM: PORTABLE CHEST 1 VIEW COMPARISON:  CT of the abdomen pelvis dated 12/28/2017 and chest radiograph dated 01/11/2018 FINDINGS: An endotracheal tube is noted with tip approximately 5 cm above the carina. An enteric tube extends into the left hemiabdomen with tip to the left of the L1 spine over the gastric air likely in the mid body of the stomach. Right IJ central venous line with tip over central SVC. There is cardiomegaly with small bilateral pleural effusions and associated atelectatic changes. Pneumonia is not excluded. Clinical correlation is recommended. There is no pneumothorax. No acute osseous pathology. IMPRESSION: 1. Cardiomegaly with small bilateral pleural effusions and associated atelectatic changes. 2. Support devices as described. Electronically Signed   By: Laren Everts.D.  On: 01/12/2018 05:56   Dg Chest Port 1 View  Result Date: 01/11/2018 CLINICAL DATA:  Fever EXAM: PORTABLE CHEST 1 VIEW COMPARISON:  Test radiograph 01/10/2018 FINDINGS: ET tube terminates in  the mid trachea. Enteric tube courses inferior to the diaphragm. Right IJ central venous catheter tip projects over the superior vena cava. Stable cardiomegaly. Interval increase in bilateral airspace opacities. Small bilateral pleural effusions. IMPRESSION: Stable support apparatus. Cardiomegaly. Similar-appearing diffuse bilateral airspace opacities. Small layering bilateral effusions. Electronically Signed   By: Lovey Newcomer M.D.   On: 01/11/2018 14:52     Medications:   . sodium chloride Stopped (01/11/18 1517)  . anticoagulant sodium citrate    . fentaNYL infusion INTRAVENOUS 100 mcg/hr (01/12/18 0800)  . heparin 1,700 Units/hr (01/12/18 0800)  . piperacillin-tazobactam (ZOSYN)  IV Stopped (01/12/18 0156)  . propofol (DIPRIVAN) infusion Stopped (01/11/18 0818)  . vancomycin     . B-complex with vitamin C  1 tablet Per Tube QHS  . chlorhexidine gluconate (MEDLINE KIT)  15 mL Mouth Rinse BID  . Chlorhexidine Gluconate Cloth  6 each Topical Q0600  . famotidine  20 mg Per Tube BID  . feeding supplement (PRO-STAT SUGAR FREE 64)  60 mL Per Tube 5 X Daily  . feeding supplement (VITAL HIGH PROTEIN)  1,000 mL Per Tube Q24H  . fentaNYL (SUBLIMAZE) injection  50 mcg Intravenous Once  . furosemide  40 mg Intravenous Once  . furosemide  80 mg Intravenous Q12H  . insulin aspart  0-15 Units Subcutaneous Q4H  . ipratropium-albuterol  3 mL Nebulization Q6H  . mouth rinse  15 mL Mouth Rinse 10 times per day  . multivitamin  15 mL Per Tube Daily  . multivitamin-lutein  1 capsule Per Tube Daily  . polyvinyl alcohol  1 drop Both Eyes TID  . sodium chloride flush  10-40 mL Intracatheter Q12H  . sodium chloride flush  3 mL Intravenous Q12H  . thiamine  100 mg Per Tube Daily   sodium chloride, acetaminophen **OR** acetaminophen, anticoagulant sodium citrate, bisacodyl, fentaNYL, iopamidol, ipratropium-albuterol, labetalol, midazolam, midazolam, neomycin-bacitracin-polymyxin, sodium chloride flush,  sodium chloride flush, vancomycin, vecuronium  Assessment/ Plan:  52 y.o. male with diabetes mellitus type 2, hypertension, chronic systolic heart failure ejection fraction 45%, who was admitted to [facility] on6/21/2019for evaluation of abdominal discomfort and bloating. Patient had decompensation on January 03, 2018 with severe hypotension requiring multiple pressors, acute respiratory failure, shock liver and acute renal failure. Started CRRT from 6/22 to 6/28. Intermittent hemodialysis on 6/28. Trial of furosemide with good results on 6/29.   1. Acute renal failure secondary to severe hypotension,and also exposed to contrast with CTA. Baseline careatinine 1.14, 12/12/16 2. Hyperkalemia. 3. Metabolic acidosis with elevated lactic acid level, pt was on metformin and TCAs as outpt.  4. Acute respiratory failure. 5.  Generalized edema   Patient had a clinical setback last night when he became agitated.  He had to be intubated paralyzed because he bit through his ET tube.  Urine output has improved.  Serum creatinine improving slowly.  Platelet count has also improved.  Electrolytes and volume status are acceptable.  No acute indication for dialysis. CVP 23 cm. Urine output almost 6 L.  Will decrease dose of Lasix for now to 40 mg iv BID.       LOS: 9 Jamarco Zaldivar 7/1/20199:02 AM  [place]  Note: This note was prepared with Dragon dictation. Any transcription errors are unintentional

## 2018-01-12 NOTE — Progress Notes (Signed)
Patient has had HTN, tachycardia, and tachypnea most of the shift. Had IV labetalol several times this shift. Showed signs of discomfort, based on VS, and was given IV Fentanyl and Versed pushes. When patient continued to show distress, patient was administered Vec with Fentanyl pushes x 2. Vent settings were changed several times.  Bit through ETT tube, and had to have tube replaced. Bite block added later to maintain sats that continued to drop after new ETT tube was placed. CXR confirmed placement of ETT and OG. Wife was called to notify that patient bit through the tube and it was being replaced. Patient had more than 2 L of urinary output, but BUN/creat still elevated. Retarted on Fentanyl gtt at 50 mcg. Potassium back WNL after it was replaced yesterday.

## 2018-01-12 NOTE — Progress Notes (Signed)
CRITICAL CARE NOTE  History of Present illness This is a52 y.o.malewith a known history of habitus mellitus type II, hypertension presented to the emergency room with abdominal discomfort and bloating.This started couple of days ago.Patient also has difficulty taking a deep breath because of the abdominal bloating.Last bowel movement was couple of days ago.Patient was found to be tachycardic when he presented to the emergency room.He has been evaluated for tachycardia by Dr. Lady GaryFath from cardiology and has been prescribed initially metoprolol patient could not tolerate the metoprolol and he was started on verapamil.Patient continues to be tachycardic even in the emergency room with a heart rate of 130 bpm.He was worked up with CT abdomen which showed no obstruction or any acute pathology.No complaints of any chest pain per se.Hospitalist service was consulted for further care.  6/22 The patient was being treated with Cardizem drip but developed worsening respiratory distress and required to be transferred to the ICU.  Upon arrival to the unit, he was hypotensive and hypoxic with mottled abdomen, cool and mottled extremities.  Hence he was intubated and central line was placed.  He was found to be severely acidemic. CT head, chest abdomen and pelvis ruled out infectious etiology or PE. Stat Echo R/O pericardial tamponade.   SUBJECTIVE Patient tachycardic and tachypneic at the early part of the shift. Given IV pushes of fentanyl, versed and vecuronium but suddenly became hypoxic, with audible airflow from the ETT tube. Patient had been biting on the tube. He had a bite block that was removed yesterday. ETT exchanged by respiratory with imrpovement in SPO2 but suddenly his SPO2 dropped again. The bite guard was placed and his SPO2 improved. His PEEP was increased to 10 and Fi02 to 100%. Patient restarted on fentanyl gtt and prn versed                STUDIES: 6/22 ECHO-EF  45-50%  CULTURES: MRSA PCR- Neg  ANTIBIOTICS: 6/22Zosyn; Vancomycin 6/24 Vancomycin D/C  SIGNIFICANT EVENTS: 6/21 admission to hospital 6/22 transfer to ICU, intubated, central line, HD cath and right femoral A-line placed 7/1 ETT exchanged due to desaturation and low volumes, bite blocked replaced and patient restarted on fentanyl gtt   LINES/TUBES: 6/22 Foley 6/22 R-IJ TLC, R- Femoral hemodialysis cath 6/22 L- Femoral Arterial line 6/22 Endotracheal tube 6/26 failed wean off paralytics   BP (!) 155/87   Pulse (!) 135   Temp (!) 102.5 F (39.2 C) (Axillary)   Resp (!) 25   Ht 5\' 9"  (1.753 m)   Wt (!) 303 lb 12.7 oz (137.8 kg)   SpO2 95%   BMI 44.86 kg/m    REVIEW OF SYSTEMS  PATIENT IS UNABLE TO PROVIDE COMPLETE REVIEW OF SYSTEM S DUE TO SEVERE CRITICAL ILLNESS AND ENCEPHALOPATHY   PHYSICAL EXAMINATION:  GENERAL:critically ill appearing, +resp distress HEAD: Normocephalic, atraumatic.  EYES: Pupils equal, round, reactive to light.  No scleral icterus.  MOUTH: Moist mucosal membrane. NECK: Supple. No thyromegaly. No nodules. No JVD.  PULMONARY: +rhonchi, +wheezing CARDIOVASCULAR: S1 and S2. Regular rate and rhythm. No murmurs, rubs, or gallops. +3 edema GASTROINTESTINAL: Soft, nontender, -distended. No masses. Positive bowel sounds. No hepatosplenomegaly.  MUSCULOSKELETAL: No swelling, clubbing, or edema.  NEUROLOGIC: obtunded, GCS<8 SKIN:intact,warm,dry  ASSESSMENT AND PLAN  Severe Hypoxic and Hypercapnic Respiratory Failure, with superimposed nosocomial pneumonia-cultures negative so far; recurrent hypoxemia.  Continue vancomycin and Zosyn, and follow up respiratory culture. STAT CXR unchanged from pervious. Continue current vent settings. ABG at 730am  Leukocytosis-resolved  Hypokalemia.  We'll leave for nephrology unsure if patient will receive IHD today  Renal Failure most likely due to ATN, Changed to IHD per nephrology  Anemia. No evidence of  active bleeding  Tanish Prien S. [facility] ANP-BC Pulmonary and Critical Care Medicine [facility] Pager [phone] or [phone]  NB: This document was prepared using Dragon voice recognition software and may include unintentional dictation errors.

## 2018-01-12 DEATH — deceased

## 2018-01-13 ENCOUNTER — Inpatient Hospital Stay: Payer: BLUE CROSS/BLUE SHIELD

## 2018-01-13 LAB — HEPARIN LEVEL (UNFRACTIONATED)
HEPARIN UNFRACTIONATED: 0.9 [IU]/mL — AB (ref 0.30–0.70)
HEPARIN UNFRACTIONATED: 0.8 [IU]/mL — AB (ref 0.30–0.70)
Heparin Unfractionated: 0.98 IU/mL — ABNORMAL HIGH (ref 0.30–0.70)
Heparin Unfractionated: 0.79 IU/mL — ABNORMAL HIGH (ref 0.30–0.70)

## 2018-01-13 LAB — GLUCOSE, CAPILLARY
GLUCOSE-CAPILLARY: 155 mg/dL — AB (ref 70–99)
GLUCOSE-CAPILLARY: 212 mg/dL — AB (ref 70–99)
GLUCOSE-CAPILLARY: 208 mg/dL — AB (ref 70–99)
Glucose-Capillary: 145 mg/dL — ABNORMAL HIGH (ref 70–99)
Glucose-Capillary: 144 mg/dL — ABNORMAL HIGH (ref 70–99)
Glucose-Capillary: 181 mg/dL — ABNORMAL HIGH (ref 70–99)

## 2018-01-13 LAB — VANCOMYCIN, RANDOM: VANCOMYCIN RM: 5

## 2018-01-13 LAB — CBC
HCT: 28 % — ABNORMAL LOW (ref 40.0–52.0)
Hemoglobin: 9 g/dL — ABNORMAL LOW (ref 13.0–18.0)
MCH: 33.1 pg (ref 26.0–34.0)
MCHC: 32.2 g/dL (ref 32.0–36.0)
MCV: 102.9 fL — AB (ref 80.0–100.0)
PLATELETS: 184 10*3/uL (ref 150–440)
RBC: 2.72 MIL/uL — AB (ref 4.40–5.90)
RDW: 18.6 % — AB (ref 11.5–14.5)
WBC: 7.5 10*3/uL (ref 3.8–10.6)

## 2018-01-13 LAB — CULTURE, RESPIRATORY W GRAM STAIN

## 2018-01-13 LAB — BASIC METABOLIC PANEL
ANION GAP: 11 (ref 5–15)
BUN: 90 mg/dL — AB (ref 6–20)
CHLORIDE: 115 mmol/L — AB (ref 98–111)
CO2: 28 mmol/L (ref 22–32)
Calcium: 8.2 mg/dL — ABNORMAL LOW (ref 8.9–10.3)
Creatinine, Ser: 1.85 mg/dL — ABNORMAL HIGH (ref 0.61–1.24)
GFR, EST AFRICAN AMERICAN: 47 mL/min — AB (ref >60)
GFR, EST NON AFRICAN AMERICAN: 41 mL/min — AB (ref >60)
Glucose, Bld: 219 mg/dL — ABNORMAL HIGH (ref 70–99)
POTASSIUM: 2.9 mmol/L — AB (ref 3.5–5.1)
SODIUM: 154 mmol/L — AB (ref 135–145)

## 2018-01-13 LAB — RENAL FUNCTION PANEL
ALBUMIN: 2.9 g/dL — AB (ref 3.5–5.0)
ANION GAP: 9 (ref 5–15)
BUN: 91 mg/dL — ABNORMAL HIGH (ref 6–20)
CHLORIDE: 112 mmol/L — AB (ref 98–111)
CO2: 32 mmol/L (ref 22–32)
Calcium: 8.2 mg/dL — ABNORMAL LOW (ref 8.9–10.3)
Creatinine, Ser: 1.99 mg/dL — ABNORMAL HIGH (ref 0.61–1.24)
GFR calc Af Amer: 43 mL/min — ABNORMAL LOW (ref >60)
GFR calc non Af Amer: 37 mL/min — ABNORMAL LOW (ref >60)
GLUCOSE: 162 mg/dL — AB (ref 70–99)
PHOSPHORUS: 3.6 mg/dL (ref 2.5–4.6)
POTASSIUM: 2.3 mmol/L — AB (ref 3.5–5.1)
Sodium: 153 mmol/L — ABNORMAL HIGH (ref 135–145)

## 2018-01-13 LAB — MAGNESIUM: Magnesium: 2.4 mg/dL (ref 1.7–2.4)

## 2018-01-13 LAB — CULTURE, RESPIRATORY

## 2018-01-13 MED ORDER — POTASSIUM CHLORIDE 20 MEQ PO PACK
60.0000 meq | PACK | Freq: Once | ORAL | Status: AC
  Administered 2018-01-13: 60 meq via NASOGASTRIC
  Filled 2018-01-13: qty 3

## 2018-01-13 MED ORDER — METOPROLOL TARTRATE 5 MG/5ML IV SOLN
5.0000 mg | Freq: Once | INTRAVENOUS | Status: AC
  Administered 2018-01-13: 5 mg via INTRAVENOUS
  Filled 2018-01-13: qty 5

## 2018-01-13 MED ORDER — DILTIAZEM HCL 60 MG PO TABS
60.0000 mg | ORAL_TABLET | Freq: Four times a day (QID) | ORAL | Status: DC
  Administered 2018-01-13 – 2018-01-18 (×17): 60 mg via ORAL
  Filled 2018-01-13 (×18): qty 1

## 2018-01-13 MED ORDER — POTASSIUM CHLORIDE 20 MEQ/15ML (10%) PO SOLN
60.0000 meq | Freq: Once | ORAL | Status: AC
  Administered 2018-01-13: 60 meq
  Filled 2018-01-13: qty 45

## 2018-01-13 MED ORDER — ADULT MULTIVITAMIN LIQUID CH
15.0000 mL | Freq: Every day | ORAL | Status: DC
  Administered 2018-01-14 – 2018-01-17 (×4): 15 mL
  Filled 2018-01-13 (×5): qty 15

## 2018-01-13 MED ORDER — PIPERACILLIN-TAZOBACTAM 3.375 G IVPB
3.3750 g | Freq: Three times a day (TID) | INTRAVENOUS | Status: DC
  Administered 2018-01-13 – 2018-01-16 (×10): 3.375 g via INTRAVENOUS
  Filled 2018-01-13 (×9): qty 50

## 2018-01-13 MED ORDER — FREE WATER
100.0000 mL | Status: DC
  Administered 2018-01-13 (×5): 100 mL

## 2018-01-13 MED ORDER — STERILE WATER FOR INJECTION IJ SOLN
INTRAMUSCULAR | Status: AC
  Filled 2018-01-13: qty 10

## 2018-01-13 MED ORDER — METOPROLOL TARTRATE 25 MG PO TABS
25.0000 mg | ORAL_TABLET | Freq: Four times a day (QID) | ORAL | Status: DC
  Filled 2018-01-13: qty 1

## 2018-01-13 MED ORDER — AMIODARONE LOAD VIA INFUSION
150.0000 mg | Freq: Once | INTRAVENOUS | Status: AC
  Administered 2018-01-13: 150 mg via INTRAVENOUS
  Filled 2018-01-13: qty 83.34

## 2018-01-13 MED ORDER — FREE WATER
250.0000 mL | Status: DC
  Administered 2018-01-13 – 2018-01-18 (×26): 250 mL

## 2018-01-13 MED ORDER — POTASSIUM CHLORIDE 10 MEQ/50ML IV SOLN
10.0000 meq | INTRAVENOUS | Status: AC
  Administered 2018-01-13 (×2): 10 meq via INTRAVENOUS
  Filled 2018-01-13 (×2): qty 50

## 2018-01-13 MED ORDER — AMIODARONE HCL IN DEXTROSE 360-4.14 MG/200ML-% IV SOLN
60.0000 mg/h | INTRAVENOUS | Status: AC
  Administered 2018-01-13: 60 mg/h via INTRAVENOUS
  Filled 2018-01-13 (×2): qty 200

## 2018-01-13 MED ORDER — JUVEN PO PACK
1.0000 | PACK | Freq: Two times a day (BID) | ORAL | Status: DC
  Administered 2018-01-13 – 2018-01-17 (×9): 1

## 2018-01-13 MED ORDER — IBUPROFEN 400 MG PO TABS
600.0000 mg | ORAL_TABLET | Freq: Once | ORAL | Status: AC
  Administered 2018-01-13: 600 mg via ORAL
  Filled 2018-01-13: qty 2

## 2018-01-13 MED ORDER — METOPROLOL TARTRATE 5 MG/5ML IV SOLN
5.0000 mg | Freq: Once | INTRAVENOUS | Status: AC
  Administered 2018-01-13: 5 mg via INTRAVENOUS

## 2018-01-13 MED ORDER — METOPROLOL TARTRATE 5 MG/5ML IV SOLN
INTRAVENOUS | Status: AC
  Filled 2018-01-13: qty 5

## 2018-01-13 MED ORDER — AMIODARONE HCL IN DEXTROSE 360-4.14 MG/200ML-% IV SOLN
60.0000 mg/h | INTRAVENOUS | Status: DC
  Administered 2018-01-13: 30 mg/h via INTRAVENOUS
  Administered 2018-01-14 – 2018-01-21 (×22): 60 mg/h via INTRAVENOUS
  Filled 2018-01-13 (×31): qty 200

## 2018-01-13 MED ORDER — AMIODARONE IV BOLUS ONLY 150 MG/100ML
150.0000 mg | Freq: Once | INTRAVENOUS | Status: AC
  Administered 2018-01-13: 150 mg via INTRAVENOUS

## 2018-01-13 MED ORDER — VANCOMYCIN HCL 10 G IV SOLR
1500.0000 mg | Freq: Once | INTRAVENOUS | Status: AC
  Administered 2018-01-13: 1500 mg via INTRAVENOUS
  Filled 2018-01-13: qty 1500

## 2018-01-13 NOTE — Progress Notes (Signed)
ANTICOAGULATION CONSULT NOTE - Follow Up Consult  Pharmacy Consult for Heparin Drip Indication: atrial fibrillation  Allergies  Allergen Reactions  . No Known Allergies     Patient Measurements: Height: 5\' 9"  (175.3 cm) Weight: (!) 300 lb 14.9 oz (136.5 kg) IBW/kg (Calculated) : 70.7 Heparin Dosing Weight: 98.9 kg  Vital Signs: Temp: 97.9 F (36.6 C) (07/02 0300) Temp Source: Esophageal (07/01 2000) BP: 103/67 (07/02 0300) Pulse Rate: 128 (07/02 0300)  Labs: Recent Labs    01/11/18 0428 01/12/18 0535 01/13/18 0350  HGB 8.9*  --  9.0*  HCT 26.9*  --  28.0*  PLT 124*  --  184  HEPARINUNFRC 0.65 0.68 0.98*  CREATININE 2.49* 2.24*  --     Estimated Creatinine Clearance: 53.5 mL/min (A) (by C-G formula based on SCr of 2.24 mg/dL (H)).   Assessment: Patient transitioned over to Heparin drip today. Currently infusing at 1700 units/hr.  6/28 1800 Heparin level resulted at 0.30  Goal of Therapy:  Heparin level 0.3-0.7 units/ml Monitor platelets by anticoagulation protocol: Yes   Plan:  07/02 @ 0400 HL 0.98 supratherapeutic. Will decrease rate to 1600 units/hr and will recheck HL @ 1000. CBC stable.  Thomasene Rippleavid Catrina Fellenz, PharmD, BCPS Clinical Pharmacist 01/13/2018

## 2018-01-13 NOTE — Progress Notes (Signed)
ANTICOAGULATION CONSULT NOTE - Follow Up Consult  Pharmacy Consult for Heparin Drip Indication: atrial fibrillation  Allergies  Allergen Reactions  . No Known Allergies     Patient Measurements: Height: 5\' 9"  (175.3 cm) Weight: (!) 300 lb 14.9 oz (136.5 kg) IBW/kg (Calculated) : 70.7 Heparin Dosing Weight: 98.9 kg  Vital Signs: Temp: 98.8 F (37.1 C) (07/02 1100) BP: 118/59 (07/02 1100) Pulse Rate: 139 (07/02 1100)  Labs: Recent Labs    01/11/18 0428 01/12/18 0535 01/13/18 0350 01/13/18 0942 01/13/18 1046  HGB 8.9*  --  9.0*  --   --   HCT 26.9*  --  28.0*  --   --   PLT 124*  --  184  --   --   HEPARINUNFRC 0.65 0.68 0.98*  --  0.90*  CREATININE 2.49* 2.24* 1.99* 1.85*  --     Estimated Creatinine Clearance: 64.8 mL/min (A) (by C-G formula based on SCr of 1.85 mg/dL (H)).  52 y/o M admitted with septic shock on heparin drip for atrial flutter.   Goal of Therapy:  Heparin level 0.3-0.7 units/ml Monitor platelets by anticoagulation protocol: Yes   Assessment/Plan:  Heparin level remains above goal. Will decrease heparin infusion rate to 1400 units/hr and recheck a HL in 6 hours.   Luisa HartScott Jaxsun Ciampi, PharmD Clinical Pharmacist  01/13/2018

## 2018-01-13 NOTE — Progress Notes (Signed)
[facility], [place] 01/13/18  Subjective:   Critically ill, Sedated, vent support FiO2 30%, tube feeds going at 20 cc/h, patient is continued on propofol, fentanyl, heparin infusions.  He remains tachycardic with heart rate in the 140s.  CVP 21.  Urine output is brisk.  Recorded at 3400 cc last 24 hours Labs from this morning show sodium of 153, low potassium 2.3, BUN 91 and improvement in serum creatinine to 1.99   Objective:  Vital signs in last 24 hours:  Temp:  [97.7 F (36.5 C)-103 F (39.4 C)] 98.4 F (36.9 C) (07/02 0900) Pulse Rate:  [128-140] 137 (07/02 0500) Resp:  [0-39] 25 (07/02 0900) BP: (103-161)/(67-90) 120/68 (07/02 0900) SpO2:  [90 %-100 %] 100 % (07/02 0809) FiO2 (%):  [30 %-40 %] 30 % (07/02 0809) Weight:  [136.5 kg (300 lb 14.9 oz)] 136.5 kg (300 lb 14.9 oz) (07/02 0406)  Weight change: -1.3 kg (-2 lb 13.9 oz) Filed Weights   01/11/18 0437 01/12/18 0240 01/13/18 0406  Weight: (!) 141.2 kg (311 lb 4.6 oz) (!) 137.8 kg (303 lb 12.7 oz) (!) 136.5 kg (300 lb 14.9 oz)    Intake/Output:    Intake/Output Summary (Last 24 hours) at 01/13/2018 0933 Last data filed at 01/13/2018 0900 Gross per 24 hour  Intake 1536.04 ml  Output 2650 ml  Net -1113.96 ml     Physical Exam: General:  Critically ill-appearing, laying in the bed  HEENT   ET tube in place  Pulm/lungs  ventilator assisted, FiO2 30%  CVS/Heart  sinus tachycardia in the 140s  Abdomen:   Soft, nontender, nondistended, scrotal edema  Extremities:  2-3+ dependent pitting edema  Neurologic:  Sedated  Skin:  No acute rashes  Access: Rt femoral vascath   Foley catheter in place    Basic Metabolic Panel:  Recent Labs  Lab 01/09/18 2142 01/10/18 0228 01/11/18 0428 01/12/18 0535 01/13/18 0350  NA 137 137 140 144 153*  K 4.2 4.4 3.2* 3.6 2.3*  CL 105 104 107 105 112*  CO2 _0 32  GLUCOSE 141* 154* 131* 213* 162*  BUN 44* 54* 87* 98* 91*  CREATININE 1.44*  1.75* 2.49* 2.24* 1.99*  CALCIUM 8.3* 8.4* 8.0* 8.2* 8.2*  MG 2.2 2.3 2.4 2.2 2.4  PHOS 3.6 4.1 5.3* 6.2* 3.6     CBC: Recent Labs  Lab 01/07/18 0334  01/09/18 0342 01/09/18 0945 01/10/18 0228 01/11/18 0428 01/13/18 0350  WBC 9.9   < > 10.5 13.4* 15.3* 9.7 7.5  NEUTROABS 8.2*  --   --  10.5*  --   --   --   HGB 9.4*   < > 9.9* 10.1* 9.9* 8.9* 9.0*  HCT 29.0*   < > 31.1* 31.9* 31.8* 26.9* 28.0*  MCV 102.0*   < > 103.1* 104.6* 103.8* 102.7* 102.9*  PLT 41*   < > 67* 76* 116* 124* 184   < > = values in this interval not displayed.      Lab Results  Component Value Date   HEPBSAG Negative 01/09/2018   HEPBSAB Non Reactive 01/09/2018   HEPBIGM Negative 01/03/2018      Microbiology:  Recent Results (from the past 240 hour(s))  Culture, blood (routine x 2)     Status: None   Collection Time: 01/03/18 10:42 AM  Result Value Ref Range Status   Specimen Description BLOOD LT [facility]  Final   Special Requests   Final    BOTTLES  DRAWN AEROBIC AND ANAEROBIC Blood Culture adequate volume   Culture   Final    NO GROWTH 5 DAYS Performed at [facility], [address] [place], [place] [address]    Report Status 01/08/2018 FINAL  Final  MRSA PCR Screening     Status: None   Collection Time: 01/03/18 10:56 AM  Result Value Ref Range Status   MRSA by PCR NEGATIVE NEGATIVE Final    Comment:        The GeneXpert MRSA Assay (FDA approved for NASAL specimens only), is one component of a comprehensive MRSA colonization surveillance program. It is not intended to diagnose MRSA infection nor to guide or monitor treatment for MRSA infections. Performed at [facility], [address] [place], [place] [address]   CULTURE, BLOOD (ROUTINE X 2) w Reflex to ID Panel     Status: None   Collection Time: 01/03/18  1:25 PM  Result Value Ref Range Status   Specimen Description BLOOD LINE  Final   Special Requests   Final    BOTTLES DRAWN AEROBIC AND ANAEROBIC Blood  Culture adequate volume   Culture   Final    NO GROWTH 5 DAYS Performed at [facility], [address] [place], [place] [address]    Report Status 01/08/2018 FINAL  Final  Culture, Urine     Status: None   Collection Time: 01/03/18  4:49 PM  Result Value Ref Range Status   Specimen Description   Final    URINE, RANDOM Performed at [facility], [address] [place], [place] [address]    Special Requests   Final    NONE Performed at [facility], [address] [place], [place] [address]    Culture   Final    NO GROWTH Performed at [facility], [place] [address], [place], [place] [address]    Report Status 01/04/2018 FINAL  Final  Culture, expectorated sputum-assessment     Status: None   Collection Time: 01/03/18  5:53 PM  Result Value Ref Range Status   Specimen Description TRACHEAL ASPIRATE  Final   Special Requests NONE  Final   Sputum evaluation   Final    THIS SPECIMEN IS ACCEPTABLE FOR SPUTUM CULTURE Performed at [facility], [address], [place], [place] [address]    Report Status 01/03/2018 FINAL  Final  Culture, respiratory (NON-Expectorated)     Status: None   Collection Time: 01/03/18  5:53 PM  Result Value Ref Range Status   Specimen Description   Final    TRACHEAL ASPIRATE Performed at [facility], [address] [place], [place] [address]    Special Requests   Final    NONE Reflexed from [id number] Performed at [facility], [address] [place], [place] [address]    Gram Stain   Final    FEW WBC PRESENT, PREDOMINANTLY PMN RARE SQUAMOUS EPITHELIAL CELLS PRESENT FEW GRAM POSITIVE COCCI IN CLUSTERS FEW GRAM NEGATIVE COCCOBACILLI    Culture   Final    Consistent with normal respiratory flora. Performed at [facility], [place] [address], [place], [place] [address]    Report Status 01/06/2018 FINAL  Final  CULTURE, BLOOD (ROUTINE X 2) w Reflex to ID Panel     Status: None  (Preliminary result)   Collection Time: 01/10/18  9:35 AM  Result Value Ref Range Status   Specimen Description BLOOD L WRIST  Final   Special Requests  Final    BOTTLES DRAWN AEROBIC AND ANAEROBIC Blood Culture adequate volume   Culture   Final    NO GROWTH 3 DAYS Performed at [facility], [address] [place], [place] [address]    Report Status PENDING  Incomplete  CULTURE, BLOOD (ROUTINE X 2) w Reflex to ID Panel     Status: None (Preliminary result)   Collection Time: 01/10/18  9:47 AM  Result Value Ref Range Status   Specimen Description BLOOD R FOREARM  Final   Special Requests   Final    BOTTLES DRAWN AEROBIC AND ANAEROBIC Blood Culture adequate volume   Culture   Final    NO GROWTH 3 DAYS Performed at [facility], [address], [place], [place] [address]    Report Status PENDING  Incomplete  Culture, respiratory (NON-Expectorated)     Status: None (Preliminary result)   Collection Time: 01/10/18 12:03 PM  Result Value Ref Range Status   Specimen Description   Final    TRACHEAL ASPIRATE Performed at [facility], [address], [place], [place] [address]    Special Requests   Final    NONE Performed at [facility], [address] [place], [place] [address]    Gram Stain   Final    ABUNDANT WBC PRESENT,BOTH PMN AND MONONUCLEAR RARE SQUAMOUS EPITHELIAL CELLS PRESENT RARE BUDDING YEAST SEEN    Culture   Final    MODERATE YEAST IDENTIFICATION TO FOLLOW Performed at [facility], [place] [address], [place], [place] [address]    Report Status PENDING  Incomplete  Culture, blood (Routine X 2) w Reflex to ID Panel     Status: None (Preliminary result)   Collection Time: 01/12/18 10:06 PM  Result Value Ref Range Status   Specimen Description BLOOD RIGHT HAND  Final   Special Requests   Final    BOTTLES DRAWN AEROBIC AND ANAEROBIC Blood Culture adequate volume   Culture   Final    NO GROWTH < 12 HOURS Performed at  [facility], [address], [place], [place] [address]    Report Status PENDING  Incomplete  Culture, blood (Routine X 2) w Reflex to ID Panel     Status: None (Preliminary result)   Collection Time: 01/12/18 10:07 PM  Result Value Ref Range Status   Specimen Description BLOOD RIGHT ANTECUBITAL  Final   Special Requests   Final    BOTTLES DRAWN AEROBIC AND ANAEROBIC Blood Culture results may not be optimal due to an excessive volume of blood received in culture bottles   Culture   Final    NO GROWTH < 12 HOURS Performed at [facility], [address] [place], [place] [address]    Report Status PENDING  Incomplete    Coagulation Studies: No results for input(s): LABPROT, INR in the last 72 hours.  Urinalysis: Recent Labs    01/10/18 1016  COLORURINE AMBER*  LABSPEC 1.016  PHURINE 5.0  GLUCOSEU NEGATIVE  HGBUR NEGATIVE  BILIRUBINUR NEGATIVE  KETONESUR NEGATIVE  PROTEINUR 100*  NITRITE NEGATIVE  LEUKOCYTESUR NEGATIVE      Imaging: Dg Abd 1 View  Result Date: 01/12/2018 CLINICAL DATA:  52 year old male with NG tube placement. EXAM: PORTABLE CHEST 1 VIEW COMPARISON:  CT of the abdomen pelvis dated 01/08/2018 and chest radiograph dated 01/11/2018 FINDINGS: An endotracheal tube is noted with tip approximately 5 cm above the carina. An enteric tube extends into the left hemiabdomen with tip to  the left of the L1 spine over the gastric air likely in the mid body of the stomach. Right IJ central venous line with tip over central SVC. There is cardiomegaly with small bilateral pleural effusions and associated atelectatic changes. Pneumonia is not excluded. Clinical correlation is recommended. There is no pneumothorax. No acute osseous pathology. IMPRESSION: 1. Cardiomegaly with small bilateral pleural effusions and associated atelectatic changes. 2. Support devices as described. Electronically Signed   By: Anner Crete M.D.   On: 01/12/2018 05:56   Dg Chest  Port 1 View  Result Date: 01/13/2018 CLINICAL DATA:  52 year old male with sepsis and respiratory failure. Acute renal failure. Intubated. EXAM: PORTABLE CHEST 1 VIEW COMPARISON:  01/12/2018 and earlier. FINDINGS: Portable AP semi upright view at 0756 hours. Stable endotracheal tube tip at the level the clavicles. Stable right IJ central line. Enteric tube courses to the left upper quadrant, tip not included. Stable lung volumes. No pneumothorax. Stable pulmonary vascularity without overt edema. Dense retrocardiac and veiling bibasilar opacity is stable. Paucity of bowel gas in the upper abdomen. IMPRESSION: 1.  Stable lines and tubes. 2. Stable ventilation with bilateral lower lobe collapse or consolidation and probable small pleural effusions. Electronically Signed   By: Genevie Ann M.D.   On: 01/13/2018 09:13   Dg Chest Port 1 View  Result Date: 01/12/2018 CLINICAL DATA:  52 year old male with NG tube placement. EXAM: PORTABLE CHEST 1 VIEW COMPARISON:  CT of the abdomen pelvis dated 01/01/2018 and chest radiograph dated 01/11/2018 FINDINGS: An endotracheal tube is noted with tip approximately 5 cm above the carina. An enteric tube extends into the left hemiabdomen with tip to the left of the L1 spine over the gastric air likely in the mid body of the stomach. Right IJ central venous line with tip over central SVC. There is cardiomegaly with small bilateral pleural effusions and associated atelectatic changes. Pneumonia is not excluded. Clinical correlation is recommended. There is no pneumothorax. No acute osseous pathology. IMPRESSION: 1. Cardiomegaly with small bilateral pleural effusions and associated atelectatic changes. 2. Support devices as described. Electronically Signed   By: Anner Crete M.D.   On: 01/12/2018 05:56   Dg Chest Port 1 View  Result Date: 01/11/2018 CLINICAL DATA:  Fever EXAM: PORTABLE CHEST 1 VIEW COMPARISON:  Test radiograph 01/10/2018 FINDINGS: ET tube terminates in the mid  trachea. Enteric tube courses inferior to the diaphragm. Right IJ central venous catheter tip projects over the superior vena cava. Stable cardiomegaly. Interval increase in bilateral airspace opacities. Small bilateral pleural effusions. IMPRESSION: Stable support apparatus. Cardiomegaly. Similar-appearing diffuse bilateral airspace opacities. Small layering bilateral effusions. Electronically Signed   By: Lovey Newcomer M.D.   On: 01/11/2018 14:52     Medications:   . sodium chloride Stopped (01/11/18 1517)  . amiodarone     Followed by  . amiodarone    . anticoagulant sodium citrate    . fentaNYL infusion INTRAVENOUS 75 mcg/hr (01/13/18 0500)  . heparin 1,600 Units/hr (01/13/18 0826)  . piperacillin-tazobactam (ZOSYN)  IV    . propofol (DIPRIVAN) infusion 10.039 mcg/kg/min (01/13/18 0500)  . vancomycin     . amiodarone  150 mg Intravenous Once  . B-complex with vitamin C  1 tablet Per Tube QHS  . chlorhexidine gluconate (MEDLINE KIT)  15 mL Mouth Rinse BID  . Chlorhexidine Gluconate Cloth  6 each Topical Q0600  . sennosides  5 mL Per Tube BID   And  . docusate  100 mg Per Tube BID  .  famotidine  20 mg Per Tube BID  . feeding supplement (PRO-STAT SUGAR FREE 64)  60 mL Per Tube 5 X Daily  . feeding supplement (VITAL HIGH PROTEIN)  1,000 mL Per Tube Q24H  . fentaNYL (SUBLIMAZE) injection  50 mcg Intravenous Once  . free water  100 mL Per Tube Q2H  . furosemide  40 mg Intravenous BID  . insulin aspart  0-15 Units Subcutaneous Q4H  . insulin aspart  3 Units Subcutaneous Q4H  . ipratropium-albuterol  3 mL Nebulization Q6H  . mouth rinse  15 mL Mouth Rinse 10 times per day  . multivitamin  15 mL Per Tube Daily  . multivitamin-lutein  1 capsule Per Tube Daily  . polyvinyl alcohol  1 drop Both Eyes TID  . sodium chloride flush  10-40 mL Intracatheter Q12H  . sodium chloride flush  3 mL Intravenous Q12H  . thiamine  100 mg Per Tube Daily   sodium chloride, acetaminophen **OR**  acetaminophen, anticoagulant sodium citrate, bisacodyl, fentaNYL, iopamidol, ipratropium-albuterol, labetalol, midazolam, midazolam, neomycin-bacitracin-polymyxin, sodium chloride flush, sodium chloride flush, vancomycin, vecuronium  Assessment/ Plan:  52 y.o. male with diabetes mellitus type 2, hypertension, chronic systolic heart failure ejection fraction 45%, who was [facility] to [facility] on[facility]2019for evaluation of abdominal discomfort and bloating. Patient had decompensation on January 03, 2018 with severe hypotension requiring multiple pressors, acute respiratory failure, shock liver and acute renal failure. Started CRRT from 6/22 to 6/28. Intermittent hemodialysis on 6/28. Trial of furosemide with good results on 6/29.   1. Acute renal failure secondary to severe hypotension,and also exposed to iv contrast with CTA. Baseline creatinine 1.14, 12/12/16 2. Hyperkalemia,now hypokalemia 3. Metabolic acidosis with elevated lactic acid level, pt was on metformin and TCAs as outpt.  4. Acute respiratory failure. 5.  Generalized edema   Clinical condition appears to be stable.  Urine output is good.  Serum creatinine is improving.  With brisk urine output, electrolytes are abnormal with low potassium of 2.3 and elevated sodium at 153.  No further need for HD is anticipated.  Replace electrolytes as necessary     LOS: [place] 7/2/20199:33 AM  [place], [place]  Note: This note was prepared with Dragon dictation. Any transcription errors are unintentional

## 2018-01-13 NOTE — Progress Notes (Signed)
  Amiodarone Drug - Drug Interaction Consult Note  Recommendations: Rechecked potassium prior to starting infusion. K= 2.9. After discussion with Dr. Lonn Georgiaonforti, will order KCl 60 meq per tube once and initiate infusion.  Mg is WNL. Will continue to follow and evaluate for drug interactions.   Potassium (mmol/L)  Date Value  01/13/2018 2.9 (L)   Magnesium (mg/dL)  Date Value  16/10/960407/08/2017 2.4    Amiodarone is metabolized by the cytochrome P450 system and therefore has the potential to cause many drug interactions. Amiodarone has an average plasma half-life of 50 days (range 20 to 100 days).   There is potential for drug interactions to occur several weeks or months after stopping treatment and the onset of drug interactions may be slow after initiating amiodarone.   []  Statins: Increased risk of myopathy. Simvastatin- restrict dose to 20mg  daily. Other statins: counsel patients to report any muscle pain or weakness immediately.  []  Anticoagulants: Amiodarone can increase anticoagulant effect. Consider warfarin dose reduction. Patients should be monitored closely and the dose of anticoagulant altered accordingly, remembering that amiodarone levels take several weeks to stabilize.  []  Antiepileptics: Amiodarone can increase plasma concentration of phenytoin, the dose should be reduced. Note that small changes in phenytoin dose can result in large changes in levels. Monitor patient and counsel on signs of toxicity.  []  Beta blockers: increased risk of bradycardia, AV block and myocardial depression. Sotalol - avoid concomitant use.  []   Calcium channel blockers (diltiazem and verapamil): increased risk of bradycardia, AV block and myocardial depression.  []   Cyclosporine: Amiodarone increases levels of cyclosporine. Reduced dose of cyclosporine is recommended.  []  Digoxin dose should be halved when amiodarone is started.  []  Diuretics: increased risk of cardiotoxicity if hypokalemia  occurs.  []  Oral hypoglycemic agents (glyburide, glipizide, glimepiride): increased risk of hypoglycemia. Patient's glucose levels should be monitored closely when initiating amiodarone therapy.   []  Drugs that prolong the QT interval:  Torsades de pointes risk may be increased with concurrent use - avoid if possible.  Monitor QTc, also keep magnesium/potassium WNL if concurrent therapy can't be avoided. Marland Kitchen. Antibiotics: e.g. fluoroquinolones, erythromycin. . Antiarrhythmics: e.g. quinidine, procainamide, disopyramide, sotalol. . Antipsychotics: e.g. phenothiazines, haloperidol.  . Lithium, tricyclic antidepressants, and methadone. Thank You,  Luisa HartChristy, Lakya Schrupp D  01/13/2018 11:04 AM

## 2018-01-13 NOTE — Progress Notes (Signed)
Nutrition Follow-up  DOCUMENTATION CODES:   Morbid obesity  INTERVENTION:  Continue Vital High Protein at 20 mL/hr (480 mL goal daily volume) + Pro-Stat 60 mL 5 times daily via OGT. Provides 1480 kcal, 192 grams of protein, 403 mL H2O daily. With current propofol rate provides 1699 kcal daily.  Provide free water flush of 250 mL Q4hrs. Will provide a total of 1903 mL H2O daily including water in tube feeding.  Continue liquid MVI daily per tube.  Will discontinue B-complex with C and Ocuvite.  Provide Juven BID per tube, each supplement provides 80 kcal, 14 grams of amino acids, and vitamins/minerals necessary for wound healing.  NUTRITION DIAGNOSIS:   Inadequate oral intake related to inability to eat as evidenced by NPO status.  Ongoing - addressing with TF regimen.  GOAL:   Provide needs based on ASPEN/SCCM guidelines  Met with TF regimen.  MONITOR:   Vent status, Labs, Weight trends, TF tolerance, I & O's  REASON FOR ASSESSMENT:   Ventilator, Consult Enteral/tube feeding initiation and management  ASSESSMENT:   52 year old male with PMHx of DM type 2, HTN who initially presented with a few days history of abdominal discomfort and bloating, was found to be tachycardic in ER, later developed worsening respiratory distress requiring mechanical intubation on 6/22, also found to have severe sepsis with profound shock, severe lactic acidosis, acute renal failure on CVVHD, acute hepatic failure.   -CVVHD was stopped on 6/28. Underwent intermittent HD on 6/28. -On early AM of 7/1 patient bit through ETT and it had to be replaced. Bite block was placed. -Per Nephrology note today no further need for HD is anticipated.  Patient remains intubated and sedated. On PRVC mode with FiO2 30% at time of RD assessment. Abdomen soft today. Noted mild pitting edema to bilateral upper and lower extremities.  Access: 16 Fr. OGT placed 6/22; terminates in stomach per chest x-ray on  6/22  TF: pt tolerating Vital High Protein at 20 mL/hr + Pro-Stat 60 mL 5 times daily; per pump history patient received 460 mL tube feeds in the past 24 hours (95.8% goal daily volume)  Patient is currently intubated on ventilator support Ve: 14.6 L/min Temp (24hrs), Avg:99.1 F (37.3 C), Min:97.7 F (36.5 C), Max:102.5 F (39.2 C)  Propofol: 8.3 ml/hr (219 kcal daily)  Medications reviewed and include: B-complex with C QHS per tube, sennosides, Colace, famotidine, free water flush 100 mL Q2hrs, Lasix 40 mg IV BID, Novolog 0-15 units Q4hrs (received 18 units past 24 hrs), Novolog 3 units Q4hrs as TF coverage, liquid MVI daily per tube, Ocuvite daily per tube, thiamine 100 mg daily per tube, amiodarone gtt, fentanyl gtt, heparin gtt, Zosyn, propofol gtt.  Labs reviewed: CBG 144-212, Sodium 154, Potassium 2.9, Chloride 115, BUN 90 (trending down), Creatinine 1.85 (trending down), eGFR 41 (trending up).  I/O: 3400 mL UOP yesterday (1 mL/kg/hr); 1 BM yesterday  Weight trend: 136.5 kg on 7/2; weight trending back down; still +12.2 kg from admission wt  Discussed with RN and on rounds.  Diet Order:   Diet Order           Diet NPO time specified  Diet effective now          EDUCATION NEEDS:   No education needs have been identified at this time  Skin:  Skin Assessment: Skin Integrity Issues: Skin Integrity Issues:: Stage II Stage II: left nose (100% of wound base red or granulating)  Last BM:  01/13/2018 -  large type 7  Height:   Ht Readings from Last 1 Encounters:  01/13/18 _0  (1.753 m)    Weight:   Wt Readings from Last 1 Encounters:  01/13/18 (!) 300 lb 14.9 oz (136.5 kg)    Ideal Body Weight:  72.7 kg  BMI:  Body mass index is 44.44 kg/m.  Estimated Nutritional Needs:   Kcal:  1357-1728 (11-14 kcal/kg)  Protein:  182 grams (2.5 grams/kg IBW)  Fluid:  1.8-2.2 L/day (25-30 mL/kg IBW)  Willey Blade, MS, RD, LDN Office: [phone] Pager:  [phone] After Hours/Weekend Pager: [phone]

## 2018-01-13 NOTE — Progress Notes (Signed)
Patient tmax 103.3. Tylenol given and ice packs applied with no relief. Dr. Lonn Georgiaonforti notified and one time order of ibuprofen ordered. Patient's HR anywhere between 140-150's today. 2 doses of metoprolol IV given, 2 amio boluses given, and IV amio gtt started. Dr. Darrold JunkerParaschos consulted and started cardizem PO. initially metoprolol PO started, but patient's wife requested a different medication because patient did not have a good reaction to metoprolol when taken before being admitted to hospital.  Trudee KusterBrandi R [place]

## 2018-01-13 NOTE — Progress Notes (Signed)
[employer] [facility] [place] [facility]   PATIENT NAME: Ryan Webb    MR#:  [id number]  DATE OF BIRTH:  1966/06/22  SUBJECTIVE:   Patient remains critically ill.  Patient had 3400 normal urine output.  Nephrology is not considering hemodialysis today  on vent, febrile, tachycardic heart rate around 140s  REVIEW OF SYSTEMS:    unAble to obtain  tolerating Diet: Tube feeds        DRUG ALLERGIES:   Allergies  Allergen Reactions  . No Known Allergies     VITALS:  Blood pressure (!) 138/97, pulse (!) 133, temperature (!) 102.4 F (39.1 C), resp. rate (!) 26, height 5\' 9"  (1.753 m), weight (!) 136.5 kg (300 lb 14.9 oz), SpO2 91 %.  PHYSICAL EXAMINATION:  Constitutional: Appears obese sedated on vent HENT: Normocephalic.  Intubated Eyes: , no scleral icterus.  Neck: Normal ROM. Neck supple. No JVD. No tracheal deviation. CVS: Tachycardic S1/S2 +, no murmurs, no gallops, no carotid bruit.  Pulmonary: Effort and breath sounds normal, no stridor, rhonchi, wheezes, rales.  Abdominal: Soft. BS +,  no distension, tenderness, rebound or guarding.  Musculoskeletal: sedated Neuro:sedated     . Skin: Skin is warm and dry. No rash noted.  2+ lower extremity edema Psychiatric: sedated    LABORATORY PANEL:   CBC Recent Labs  Lab 01/13/18 0350  WBC 7.5  HGB 9.0*  HCT 28.0*  PLT 184   ------------------------------------------------------------------------------------------------------------------  Chemistries  Recent Labs  Lab 01/08/18 1223  01/13/18 0350 01/13/18 0942  NA 136  135   < > 153* 154*  K 4.7  4.6   < > 2.3* 2.9*  CL 103  102   < > 112* 115*  CO2 24  25   < > 32 28  GLUCOSE 122*  123*   < > 162* 219*  BUN 36*  38*   < > 91* 90*  CREATININE 1.22  1.29*   < > 1.99* 1.85*  CALCIUM 8.6*  8.5*   < > 8.2* 8.2*  MG 2.0   < > 2.4  --   AST 134*  --   --   --   ALT 833*  --   --   --   ALKPHOS 102  --   --   --   BILITOT 1.8*  --   --    --    < > = values in this interval not displayed.   ------------------------------------------------------------------------------------------------------------------  Cardiac Enzymes No results for input(s): TROPONINI in the last 168 hours. ------------------------------------------------------------------------------------------------------------------  RADIOLOGY:  Dg Abd 1 View  Result Date: 01/12/2018 CLINICAL DATA:  52 year old male with NG tube placement. EXAM: PORTABLE CHEST 1 VIEW COMPARISON:  CT of the abdomen pelvis dated 01/11/2018 and chest radiograph dated 01/11/2018 FINDINGS: An endotracheal tube is noted with tip approximately 5 cm above the carina. An enteric tube extends into the left hemiabdomen with tip to the left of the L1 spine over the gastric air likely in the mid body of the stomach. Right IJ central venous line with tip over central SVC. There is cardiomegaly with small bilateral pleural effusions and associated atelectatic changes. Pneumonia is not excluded. Clinical correlation is recommended. There is no pneumothorax. No acute osseous pathology. IMPRESSION: 1. Cardiomegaly with small bilateral pleural effusions and associated atelectatic changes. 2. Support devices as described. Electronically Signed   By: Elgie Collard M.D.   On: 01/12/2018 05:56   Dg Chest [facility] 1 View  Result  Date: 01/13/2018 CLINICAL DATA:  52 year old male with sepsis and respiratory failure. Acute renal failure. Intubated. EXAM: PORTABLE CHEST 1 VIEW COMPARISON:  01/12/2018 and earlier. FINDINGS: Portable AP semi upright view at 0756 hours. Stable endotracheal tube tip at the level the clavicles. Stable right IJ central line. Enteric tube courses to the left upper quadrant, tip not included. Stable lung volumes. No pneumothorax. Stable pulmonary vascularity without overt edema. Dense retrocardiac and veiling bibasilar opacity is stable. Paucity of bowel gas in the upper abdomen. IMPRESSION: 1.   Stable lines and tubes. 2. Stable ventilation with bilateral lower lobe collapse or consolidation and probable small pleural effusions. Electronically Signed   By: Odessa FlemingH  Hall M.D.   On: 01/13/2018 09:13   Dg Chest Port 1 View  Result Date: 01/12/2018 CLINICAL DATA:  52 year old male with NG tube placement. EXAM: PORTABLE CHEST 1 VIEW COMPARISON:  CT of the abdomen pelvis dated 2017-11-01 and chest radiograph dated 01/11/2018 FINDINGS: An endotracheal tube is noted with tip approximately 5 cm above the carina. An enteric tube extends into the left hemiabdomen with tip to the left of the L1 spine over the gastric air likely in the mid body of the stomach. Right IJ central venous line with tip over central SVC. There is cardiomegaly with small bilateral pleural effusions and associated atelectatic changes. Pneumonia is not excluded. Clinical correlation is recommended. There is no pneumothorax. No acute osseous pathology. IMPRESSION: 1. Cardiomegaly with small bilateral pleural effusions and associated atelectatic changes. 2. Support devices as described. Electronically Signed   By: Elgie CollardArash  Radparvar M.D.   On: 01/12/2018 05:56     ASSESSMENT AND PLAN:   52 year old male with obesity, diabetes, hypertension came in with tachyarrhythmia and was noted to be in septic shock  .  Septic shock with multiorgan failure.- PNA Continue vent management as per ICU Sputum culture obtained from tracheal aspirate is positive for gram-positive cocci in clusters and gram-negative coccobacilli 6/22 Continue Zosyn, vancomycin empirically.  Follow-up on the cultures 6/29 Previous blood cultures obtained on 01/03/2018 are negative.  Urine culture obtained on 01/03/2018 is also negative MRSA PCR is negative   .  Hypernatremia with hypokalemia D5W replete potassium  .  Ectopic atrial tachycardia versus atrial flutter-remains tachycardic CT Angio of the chest and pelvis negative for any PE or aortic  dissection. Echocardiogram with LV ejection fraction of 45% which is stable from prior. Platelet  count is better and patient is started on heparin drip.  Monitor platelet count closely Amiodarone drip   .  Acute renal failure with metabolic acidosis due to ATN from septic shock and contrast exposure due to CT scans.  Baseline creatinine 1.14 Patient was taking metformin and TCA as an outpatient, had hemodialysis  6/28.  CRRT discontinued Management per nephrology  .  Thrombocytopenia due to sepsis: Platelet count is increasing. Hit panel negative Last Platelets at 184,000   .  Acute respiratory failure: Due to sepsis and multiorgan failure Remains on ventilator and management per ICU team   6.  Elevated LFTs due to shock liver with multiorgan failure due to sepsis.  LFTs are improving GI consultation appreciated  7.  Nutrition: Continue tube feeds- vital Overall prognosis guarded.  Patient is critically ill.  Poor prognosis with high mortality and morbidity   CODE STATUS: FULL  TOTAL TIME TAKING CARE OF THIS PATIENT: 25 minutes.     POSSIBLE D/C ??, DEPENDING ON CLINICAL CONDITION.   Ramonita LabAruna Lafern Brinkley M.D on 01/13/2018 at 4:42 PM  Between 7am to 6pm - Pager - [phone] After 6pm go to [email or web address] - password [email or web address]  [facility] [place] [facility]  Office  [phone]  CC: Primary care physician; Anola Gurney, PA  Note: This dictation was prepared with Dragon dictation along with smaller phrase technology. Any transcriptional errors that result from this process are unintentional.

## 2018-01-13 NOTE — Consult Note (Signed)
Pharmacy Antibiotic Note  Ryan Webb is a 52 y.o. male admitted on 12/17/2017 with pneumonia and sepsis.  Pharmacy has been consulted for vancomycin/zosyn dosing. Patient with renal failure, was on CRRT now started on intermittent HD. Plans are for HD 4 hr 4 L today. And follow daily for HD needs. Pt now with fever, elevated WBC  Assessment/Plan: Vancomycin dosing by levels with next random level tomorrow AM. ARF improving without need for renal replacement currently. Patient received one intermittent HD session 6/28. Per Dr. Lonn Georgiaonforti, will continue empiric antibiotics with possible newly developing PNA. Will continue to assess the need for antibiotics on a daily basis.   Will increase Zosyn dosing to 3.375 g EI q 8 hours.   Height: 5\' 9"  (175.3 cm) Weight: (!) 300 lb 14.9 oz (136.5 kg) IBW/kg (Calculated) : 70.7  Temp (24hrs), Avg:99.5 F (37.5 C), Min:97.7 F (36.5 C), Max:103 F (39.4 C)  Recent Labs  Lab 01/07/18 0811  01/07/18 1600  01/07/18 2359  01/08/18 0822  01/08/18 1550  01/09/18 0342  01/09/18 0945  01/10/18 0228 01/11/18 0428 01/12/18 0535 01/13/18 0350 01/13/18 0942  WBC  --   --   --   --   --    < >  --   --   --   --  10.5  --  13.4*  --  15.3* 9.7  --  7.5  --   CREATININE 1.26*   < > 1.13   < >  --    < > 1.22   < > 1.23   < > 1.18   < >  --    < > 1.75* 2.49* 2.24* 1.99* 1.85*  LATICACIDVEN 1.7  --  1.4  --  1.8  --  1.8  --  1.4  --   --   --   --   --   --   --   --   --   --   VANCORANDOM  --   --   --   --   --   --   --   --   --   --   --   --   --   --   --   --   --  5  --    < > = values in this interval not displayed.    Estimated Creatinine Clearance: 64.8 mL/min (A) (by C-G formula based on SCr of 1.85 mg/dL (H)).    Allergies  Allergen Reactions  . No Known Allergies     Antimicrobials this admission: zosyn 6/22 >>  Vancomycin 6/22 >> 6/24, vancomycin 6/29 >>   Microbiology results: 6/22 BCx: NG 5 days 6/22 UCx: NG  6/22 Sputum:  normal respiratory flora  6/22 MRSA PCR: neg 6/29 BCx: NGTD 6/29 TA: moderate Cadida albicans 7/1 BCx: NGTD 7/2 UCx: sent 7/2 TA: sent  Thank you for allowing pharmacy to be a part of this patient's care.  Luisa HartScott Bertis Hustead, PharmD Clinical Pharmacist  01/13/2018

## 2018-01-13 NOTE — Consult Note (Signed)
Pharmacy Antibiotic Note  Ryan Webb is a 52 y.o. male admitted on 23-Mar-2018 with pneumonia and sepsis.  Pharmacy has been consulted for vancomycin/zosyn dosing. Patient with renal failure, was on CRRT now started on intermittent HD. Plans are for HD 4 hr 4 L today. And follow daily for HD needs. Pt now with fever, elevated WBC  Plan: 07/02 @ 0400 VR 5. Patient is being set up for HD; schedule not yet determined. Since random level 5 will give vanc 1.5g IV x 1 and will recheck a random level w/ am labs.  Height: 5\' 9"  (175.3 cm) Weight: (!) 300 lb 14.9 oz (136.5 kg) IBW/kg (Calculated) : 70.7  Temp (24hrs), Avg:100.4 F (38 C), Min:97.7 F (36.5 C), Max:103 F (39.4 C)  Recent Labs  Lab 01/07/18 0811  01/07/18 1600  01/07/18 2359  01/08/18 0822  01/08/18 1550  01/09/18 0342 01/09/18 0751 01/09/18 0945 01/09/18 2142 01/10/18 0228 01/11/18 0428 01/12/18 0535 01/13/18 0350  WBC  --   --   --   --   --    < >  --   --   --   --  10.5  --  13.4*  --  15.3* 9.7  --  7.5  CREATININE 1.26*   < > 1.13   < >  --    < > 1.22   < > 1.23   < > 1.18 1.18  --  1.44* 1.75* 2.49* 2.24*  --   LATICACIDVEN 1.7  --  1.4  --  1.8  --  1.8  --  1.4  --   --   --   --   --   --   --   --   --   VANCORANDOM  --   --   --   --   --   --   --   --   --   --   --   --   --   --   --   --   --  5   < > = values in this interval not displayed.    Estimated Creatinine Clearance: 53.5 mL/min (A) (by C-G formula based on SCr of 2.24 mg/dL (H)).    Allergies  Allergen Reactions  . No Known Allergies     Antimicrobials this admission: zosyn 6/23 >>  vancomycin 6/29 >>   Dose adjustments this admission:   Microbiology results: 6/22 BCx: NG 5 days 6/22 UCx: NG  6/22 Sputum: normal respiratory flora  6/22 MRSA PCR: neg  Thank you for allowing pharmacy to be a part of this patient's care.  Thomasene Rippleavid Jemel Ono, PharmD, BCPS Clinical Pharmacist 01/13/2018

## 2018-01-13 NOTE — Progress Notes (Addendum)
ANTICOAGULATION CONSULT NOTE - Follow Up Consult  Pharmacy Consult for Heparin Drip Indication: atrial fibrillation  Allergies  Allergen Reactions  . No Known Allergies     Patient Measurements: Height: 5\' 9"  (175.3 cm) Weight: (!) 300 lb 14.9 oz (136.5 kg) IBW/kg (Calculated) : 70.7 Heparin Dosing Weight: 98.9 kg  Vital Signs: Temp: 101.7 F (38.7 C) (07/02 1800) BP: 120/83 (07/02 1800) Pulse Rate: 151 (07/02 1800)  Labs: Recent Labs    01/11/18 0428 01/12/18 0535 01/13/18 0350 01/13/18 0942 01/13/18 1046 01/13/18 1804  HGB 8.9*  --  9.0*  --   --   --   HCT 26.9*  --  28.0*  --   --   --   PLT 124*  --  184  --   --   --   HEPARINUNFRC 0.65 0.68 0.98*  --  0.90* 0.79*  CREATININE 2.49* 2.24* 1.99* 1.85*  --   --     Estimated Creatinine Clearance: 64.8 mL/min (A) (by C-G formula based on SCr of 1.85 mg/dL (H)).  52 y/o M admitted with septic shock on heparin drip for atrial flutter.   Goal of Therapy:  Heparin level 0.3-0.7 units/ml Monitor platelets by anticoagulation protocol: Yes   Assessment/Plan:  Heparin level remains above goal at 0.79.  Per RN, pt is a line draw but sample was drawn per protocol after pausing and flushing. Levels have been elevated all day, after discussing with RN will order stat repeat heparin level as a stick.  Will go ahead and decrease heparin infusion rate to 1300 units/hr per protocol and recheck a HL in 6 hours. CBC in AM.    Crist FatHannah Diaz Crago, PharmD, BCPS Clinical Pharmacist 01/13/2018 7:27 PM   Addendum Stat recheck HL 0.80 will continue with above plan Crist FatHannah Yehonatan Grandison, PharmD, BCPS Clinical Pharmacist 01/13/2018 8:22 PM

## 2018-01-13 NOTE — Progress Notes (Signed)
CRITICAL CARE NOTE  CC  follow up respiratory failure  SUBJECTIVE Patient remains critically ill Prognosis is guarded Endotracheal tube was changed yesterday secondary to biting through it and subsequent hypoxemia Patient was given paralytics Patient with temperature spikes, presently on vancomycin and Zosyn, has been cultured multiple times   SIGNIFICANT EVENTS  STUDIES: 6/22 ECHO-EF 45-50%  CULTURES: MRSA PCR- Neg  ANTIBIOTICS: 6/22Zosyn; Vancomycin 6/24 Vancomycin D/C  SIGNIFICANT EVENTS: 6/21 admission to hospital 6/22 transfer to ICU 7/1 ETT changed out   LINES/TUBES: 6/22 Foley 6/22 R-IJ TLC, R- Femoral hemodialysis cath 6/22 L- Femoral Arterial line 6/22 Endotracheal tube 6/26 failed wean off paralytics 7/1 ETT changed due to damage     BP 126/77   Pulse (!) 137   Temp 97.7 F (36.5 C)   Resp (!) 0   Ht _0  (1.753 m)   Wt (!) 300 lb 14.9 oz (136.5 kg)   SpO2 95%   BMI 44.44 kg/m    REVIEW OF SYSTEMS  PATIENT IS UNABLE TO PROVIDE COMPLETE REVIEW OF SYSTEM S DUE TO SEVERE CRITICAL ILLNESS AND ENCEPHALOPATHY   PHYSICAL EXAMINATION:  GENERAL:critically ill appearing, +resp distress HEAD: Normocephalic, atraumatic.  EYES: Pupils equal, round, reactive to light.  No scleral icterus.  MOUTH: Moist mucosal membrane. NECK: Supple. No thyromegaly. No nodules. No JVD.  PULMONARY: +rhonchi, +wheezing CARDIOVASCULAR: S1 and S2. Regular rate and rhythm. No murmurs, rubs, or gallops.  GASTROINTESTINAL: Soft, nontender, -distended. No masses. Positive bowel sounds. No hepatosplenomegaly.  MUSCULOSKELETAL: No swelling, clubbing, or edema.  NEUROLOGIC: obtunded, GCS<8 SKIN:intact,warm,dry  ASSESSMENT AND PLAN 52 yo morbidly obese white male with progressive resp failure with severe septic shock from probable acute viral syndrome in setting of metformin toxicity and sever acidosis, complicated by acute renal failure with difficulty weaning from  vent   Severe Hypoxic and Hypercapnic Respiratory Failure -restarted ABX  -continue Full MV support -continue Bronchodilator Therapy -Wean Fio2 and PEEP as tolerated -will perform SAT/SBt when respiratory parameters are met   Renal Failure-most likely due to ATN On IHD. Hypernatremia will start on free water, hypokalemia, will replace  NEUROLOGY - intubated and sedated - minimal sedation to achieve a RASS goal: -1   Septic shock -use vasopressors to keep MAP>65 as needed -follow up cultures -emperic ABX   CARDIAC ICU monitoring  ID -continue IV abx as prescibed -follow up cultures   DVT/GI PRX ordered TRANSFUSIONS AS NEEDED MONITOR FSBS ASSESS the need for LABS as needed   Critical Care Time devoted to patient care services described in this note is 35 minutes.   Overall, patient is critically ill, prognosis is guarded.  Patient with Multiorgan failure and at high risk for cardiac arrest and death.   I anticipate prolonged ICU LOS and anticipate trach and PEG tube for survival.  Hermelinda Dellen, DO   Patient ID: Ryan Webb, male   DOB: 1965-08-28, 52 y.o.   MRN: [id number]

## 2018-01-14 ENCOUNTER — Inpatient Hospital Stay: Payer: BLUE CROSS/BLUE SHIELD

## 2018-01-14 LAB — BLOOD GAS, ARTERIAL
Acid-base deficit: 2 mmol/L (ref 0.0–2.0)
Bicarbonate: 22.3 mmol/L (ref 20.0–28.0)
FIO2: 40
MECHANICAL RATE: 25
O2 Saturation: 96.8 %
PEEP: 5 cmH2O
Patient temperature: 37
VT: 500 mL
pCO2 arterial: 36 mmHg (ref 32.0–48.0)
pH, Arterial: 7.4 (ref 7.350–7.450)
pO2, Arterial: 89 mmHg (ref 83.0–108.0)

## 2018-01-14 LAB — CBC
HEMATOCRIT: 32.2 % — AB (ref 40.0–52.0)
HEMOGLOBIN: 10.3 g/dL — AB (ref 13.0–18.0)
MCH: 33.1 pg (ref 26.0–34.0)
MCHC: 31.9 g/dL — ABNORMAL LOW (ref 32.0–36.0)
MCV: 103.7 fL — AB (ref 80.0–100.0)
Platelets: 278 10*3/uL (ref 150–440)
RBC: 3.11 MIL/uL — ABNORMAL LOW (ref 4.40–5.90)
RDW: 18.8 % — ABNORMAL HIGH (ref 11.5–14.5)
WBC: 9.9 10*3/uL (ref 3.8–10.6)

## 2018-01-14 LAB — QUANTIFERON-TB GOLD PLUS (RQFGPL)
QUANTIFERON NIL VALUE: 0.06 [IU]/mL
QUANTIFERON TB2 AG VALUE: 0.05 [IU]/mL
QuantiFERON Mitogen Value: 0.16 IU/mL
QuantiFERON TB1 Ag Value: 0.03 IU/mL

## 2018-01-14 LAB — RENAL FUNCTION PANEL
ALBUMIN: 2.9 g/dL — AB (ref 3.5–5.0)
Anion gap: 12 (ref 5–15)
BUN: 119 mg/dL — AB (ref 6–20)
CO2: 24 mmol/L (ref 22–32)
Calcium: 8 mg/dL — ABNORMAL LOW (ref 8.9–10.3)
Chloride: 112 mmol/L — ABNORMAL HIGH (ref 98–111)
Creatinine, Ser: 2.3 mg/dL — ABNORMAL HIGH (ref 0.61–1.24)
GFR calc Af Amer: 36 mL/min — ABNORMAL LOW (ref >60)
GFR, EST NON AFRICAN AMERICAN: 31 mL/min — AB (ref >60)
GLUCOSE: 215 mg/dL — AB (ref 70–99)
Phosphorus: 6.1 mg/dL — ABNORMAL HIGH (ref 2.5–4.6)
Potassium: 3.8 mmol/L (ref 3.5–5.1)
SODIUM: 148 mmol/L — AB (ref 135–145)

## 2018-01-14 LAB — URINE CULTURE: Culture: NO GROWTH

## 2018-01-14 LAB — SEDIMENTATION RATE: SED RATE: 67 mm/h — AB (ref 0–20)

## 2018-01-14 LAB — VANCOMYCIN, RANDOM: Vancomycin Rm: 11

## 2018-01-14 LAB — MAGNESIUM: Magnesium: 2.4 mg/dL (ref 1.7–2.4)

## 2018-01-14 LAB — QUANTIFERON-TB GOLD PLUS: QUANTIFERON-TB GOLD PLUS: UNDETERMINED

## 2018-01-14 LAB — GLUCOSE, CAPILLARY
GLUCOSE-CAPILLARY: 166 mg/dL — AB (ref 70–99)
GLUCOSE-CAPILLARY: 197 mg/dL — AB (ref 70–99)
GLUCOSE-CAPILLARY: 230 mg/dL — AB (ref 70–99)
GLUCOSE-CAPILLARY: 232 mg/dL — AB (ref 70–99)
GLUCOSE-CAPILLARY: 257 mg/dL — AB (ref 70–99)
Glucose-Capillary: 220 mg/dL — ABNORMAL HIGH (ref 70–99)
Glucose-Capillary: 219 mg/dL — ABNORMAL HIGH (ref 70–99)

## 2018-01-14 LAB — HEPARIN LEVEL (UNFRACTIONATED)
HEPARIN UNFRACTIONATED: 0.84 [IU]/mL — AB (ref 0.30–0.70)
HEPARIN UNFRACTIONATED: 0.71 [IU]/mL — AB (ref 0.30–0.70)
Heparin Unfractionated: 0.7 IU/mL (ref 0.30–0.70)

## 2018-01-14 LAB — CK: CK TOTAL: 55 U/L (ref 49–397)

## 2018-01-14 MED ORDER — SODIUM CHLORIDE 0.9 % IV SOLN
200.0000 mg | Freq: Once | INTRAVENOUS | Status: AC
  Administered 2018-01-14: 200 mg via INTRAVENOUS
  Filled 2018-01-14: qty 200

## 2018-01-14 MED ORDER — SODIUM CHLORIDE 0.9 % IV SOLN
100.0000 mg | INTRAVENOUS | Status: AC
  Administered 2018-01-15 – 2018-01-16 (×2): 100 mg via INTRAVENOUS
  Filled 2018-01-14 (×4): qty 100

## 2018-01-14 MED ORDER — VANCOMYCIN HCL 10 G IV SOLR
1500.0000 mg | Freq: Once | INTRAVENOUS | Status: AC
Start: 2018-01-14 — End: 2018-01-14
  Administered 2018-01-14: 1500 mg via INTRAVENOUS
  Filled 2018-01-14: qty 1500

## 2018-01-14 MED ORDER — INSULIN ASPART 100 UNIT/ML ~~LOC~~ SOLN
5.0000 [IU] | SUBCUTANEOUS | Status: DC
  Administered 2018-01-14 – 2018-01-15 (×4): 5 [IU] via SUBCUTANEOUS
  Filled 2018-01-14 (×4): qty 1

## 2018-01-14 MED ORDER — LINEZOLID 600 MG/300ML IV SOLN
600.0000 mg | Freq: Two times a day (BID) | INTRAVENOUS | Status: DC
  Administered 2018-01-14 – 2018-01-16 (×4): 600 mg via INTRAVENOUS
  Filled 2018-01-14 (×6): qty 300

## 2018-01-14 NOTE — Progress Notes (Signed)
SUBJECTIVE: Patient intubated   Vitals:   01/14/18 0500 01/14/18 0521 01/14/18 0600 01/14/18 0800  BP: 106/81 121/75 110/84 116/84  Pulse: (!) 132  (!) 128   Resp: (!) 27  (!) 27 (!) 32  Temp:    99 F (37.2 C)  TempSrc:    Esophageal  SpO2: 99%  100%   Weight:      Height:         Intake/Output Summary (Last 24 hours) at 01/14/2018 [id number] Last data filed at 01/14/2018 0800 Gross per 24 hour  Intake 3259.77 ml  Output 2655 ml  Net 604.77 ml      PHYSICAL EXAM  General: Acutely ill, debated HEENT:  Normocephalic and atramatic Neck:  No JVD.  Lungs: Clear bilaterally to auscultation and percussion. Heart: Tachycardia. Normal S1 and S2 without gallops or murmurs.  Abdomen: Bowel sounds are positive, abdomen soft and non-tender  Msk:  Back normal, normal gait. Normal strength and tone for age. Extremities: No clubbing, cyanosis or edema.   Neuro: Sedated Psych: Sedated   LABS: Basic Metabolic Panel: Recent Labs    01/13/18 0350 01/13/18 0942 01/14/18 0205 01/14/18 0430  NA 153* 154*  --  148*  K 2.3* 2.9*  --  3.8  CL 112* 115*  --  112*  CO2 32 28  --  24  GLUCOSE 162* 219*  --  215*  BUN 91* 90*  --  119*  CREATININE 1.99* 1.85*  --  2.30*  CALCIUM 8.2* 8.2*  --  8.0*  MG 2.4  --  2.4  --   PHOS 3.6  --   --  6.1*   Liver Function Tests: Recent Labs    01/13/18 0350 01/14/18 0430  ALBUMIN 2.9* 2.9*   No results for input(s): LIPASE, AMYLASE in the last 72 hours. CBC: Recent Labs    01/13/18 0350 01/14/18 0205  WBC 7.5 9.9  HGB 9.0* 10.3*  HCT 28.0* 32.2*  MCV 102.9* 103.7*  PLT 184 278   Cardiac Enzymes: No results for input(s): CKTOTAL, CKMB, CKMBINDEX, TROPONINI in the last 72 hours. BNP: Invalid input(s): POCBNP D-Dimer: No results for input(s): DDIMER in the last 72 hours. Hemoglobin A1C: No results for input(s): HGBA1C in the last 72 hours. Fasting Lipid Panel: Recent Labs    01/12/18 1413  TRIG 105   Thyroid Function  Tests: No results for input(s): TSH, T4TOTAL, T3FREE, THYROIDAB in the last 72 hours.  Invalid input(s): FREET3 Anemia Panel: No results for input(s): VITAMINB12, FOLATE, FERRITIN, TIBC, IRON, RETICCTPCT in the last 72 hours.  Ct Head Wo Contrast  Result Date: 01/14/2018 CLINICAL DATA:  52 y/o  M; altered level of consciousness. EXAM: CT HEAD WITHOUT CONTRAST TECHNIQUE: Contiguous axial images were obtained from the base of the skull through the vertex without intravenous contrast. COMPARISON:  01/03/2018 CT head. FINDINGS: Brain: No evidence of acute infarction, hemorrhage, hydrocephalus, extra-axial collection or mass lesion/mass effect. Few stable nonspecific foci of hypoattenuation within subcortical white matter are compatible with mild chronic microvascular ischemic changes and there is mild brain parenchymal volume loss. Vascular: Mild calcific atherosclerosis of carotid siphons. Skull: Normal. Negative for fracture or focal lesion. Sinuses/Orbits: Small fluid level in the right posterior ethmoid air cells and the sphenoid sinus likely due to intubation. Right mastoid effusion. Other: None. IMPRESSION: No acute intracranial abnormality identified. Stable negative CT of the head. Electronically Signed   By: Mitzi HansenLance  Furusawa-Stratton M.D.   On: 01/14/2018 02:42   Ct Chest  Wo Contrast  Result Date: 01/14/2018 CLINICAL DATA:  Acute respiratory illness. Interstitial lung disease. Acute renal failure secondary to severe hypotension. Metabolic acidosis. Acute respiratory failure. Generalized edema. EXAM: CT CHEST WITHOUT CONTRAST TECHNIQUE: Multidetector CT imaging of the chest was performed following the standard protocol without IV contrast. COMPARISON:  01/03/2018 FINDINGS: Cardiovascular: Cardiac enlargement. No pericardial effusion. Normal caliber thoracic aorta. Minimal aortic calcification. Right central venous catheter with tip in the low SVC. Mediastinum/Nodes: Enteric tube with tip in the distal  stomach. Esophagus is decompressed. Diffusely enlarged mediastinal lymph nodes. AP window nodes measure up to 13 mm short axis dimension. Right paratracheal nodes measure up to 1.5 cm short axis dimension. Lymph nodes are similar in appearance to previous study. Lungs/Pleura: Motion artifact limits examination. There are small bilateral pleural effusions with consolidation and atelectasis in both lung bases. This appearance is similar to previous study. There appears to be developing airspace infiltration throughout the right lung since previous study. This could represent increasing edema or multifocal pneumonia. No pneumothorax. Airways are patent. Upper Abdomen: Surgical absence of the gallbladder. Musculoskeletal: No chest wall mass or suspicious bone lesions identified. Degenerative changes in the thoracic spine. IMPRESSION: Cardiac enlargement. Small bilateral pleural effusions with consolidation and atelectasis in both lung bases. There appears to be developing airspace disease in the right lung since previous study. This could represent increasing edema or multifocal pneumonia. Mediastinal lymphadenopathy is similar to previous study and likely reactive. Aortic Atherosclerosis (ICD10-I70.0). Electronically Signed   By: Burman Nieves M.D.   On: 01/14/2018 02:50   Dg Chest Port 1 View  Result Date: 01/13/2018 CLINICAL DATA:  52 year old male with sepsis and respiratory failure. Acute renal failure. Intubated. EXAM: PORTABLE CHEST 1 VIEW COMPARISON:  01/12/2018 and earlier. FINDINGS: Portable AP semi upright view at 0756 hours. Stable endotracheal tube tip at the level the clavicles. Stable right IJ central line. Enteric tube courses to the left upper quadrant, tip not included. Stable lung volumes. No pneumothorax. Stable pulmonary vascularity without overt edema. Dense retrocardiac and veiling bibasilar opacity is stable. Paucity of bowel gas in the upper abdomen. IMPRESSION: 1.  Stable lines and tubes.  2. Stable ventilation with bilateral lower lobe collapse or consolidation and probable small pleural effusions. Electronically Signed   By: Odessa Fleming M.D.   On: 01/13/2018 09:13     Echo LVEF 45 to 50%  TELEMETRY: Atrial flutter:  ASSESSMENT AND PLAN:  Active Problems:   Tachyarrhythmia   Respiratory failure (HCC)   Shock (HCC)   Acute respiratory failure (HCC)   Transaminitis   Pressure injury of skin    1.  Tachycardia, probable atrial flutter with right bundle branch block, ventricular rate, refractory to conventional therapy, due to underlying multiple comorbidities with multiple organ failure.  Patient apparently has history of intolerance to beta-blocker. 2.  Known CAD, chronically occluded RCA by previous cardiac catheterization, borderline elevated troponin likely due to demand supply ischemia, no acute ischemic ECG changes 3.  Mild cardiomyopathy, LVEF 45 to 50% 4.  Severe sepsis with multiple organ failure 5.  Respiratory failure, intubated on ventilator  Recommendations  1.  Continue amiodarone drip, convert to p.o. prior to discharge 2.  Continue diltiazem 60 mg every 6, uptitrate as needed 3.  Defer starting beta-blocker with history of intolerance 4.  Consider adding digoxin, though hesitant in light of labile renal function  Marcina Millard, MD, PhD, [facility] 01/14/2018 8:08 AM

## 2018-01-14 NOTE — Progress Notes (Signed)
Transported pt to CT on vent and returned to ICU without incident.

## 2018-01-14 NOTE — Progress Notes (Addendum)
CRITICAL CARE NOTE  CC  follow up respiratory failure  SUBJECTIVE Over the last 24 hours patient has had multiple temperature spikes. Continues on vancomycin and Zosyn. All recent cultures thus far are negative  SIGNIFICANT EVENTS  STUDIES: 6/22 ECHO-EF 45-50%  CULTURES: MRSA PCR- Neg  ANTIBIOTICS: 6/22Zosyn; Vancomycin 6/24 Vancomycin D/C  SIGNIFICANT EVENTS: 6/21 admission to hospital 6/22 transfer to ICU 7/1 ETT changed out   LINES/TUBES: 6/22 Foley 6/22 R-IJ TLC, R- Femoral hemodialysis cath 6/22 L- Femoral Arterial line 6/22 Endotracheal tube 6/26 failed wean off paralytics 7/1 ETT changed due to damage     BP 110/84   Pulse (!) 128   Temp 99.3 F (37.4 C)   Resp (!) 27   Ht _0  (1.753 m)   Wt (!) 301 lb 9.4 oz (136.8 kg)   SpO2 100%   BMI 44.54 kg/m    REVIEW OF SYSTEMS  PATIENT IS UNABLE TO PROVIDE COMPLETE REVIEW OF SYSTEM S DUE TO SEVERE CRITICAL ILLNESS AND ENCEPHALOPATHY   PHYSICAL EXAMINATION:  GENERAL:critically ill appearing, +resp distress HEAD: Normocephalic, atraumatic.  EYES: Pupils equal, round, reactive to light.  No scleral icterus.  MOUTH: Moist mucosal membrane. NECK: Supple. No thyromegaly. No nodules. No JVD.  PULMONARY: +rhonchi, +wheezing CARDIOVASCULAR: S1 and S2. Regular rate and rhythm. No murmurs, rubs, or gallops.  GASTROINTESTINAL: Soft, nontender, -distended. No masses. Positive bowel sounds. No hepatosplenomegaly.  MUSCULOSKELETAL: No swelling, clubbing, or edema.  NEUROLOGIC: obtunded, GCS<8 SKIN:intact,warm,dry  ASSESSMENT AND PLAN 52 yo morbidly obese white male with progressive resp failure with severe septic shock from probable acute viral syndrome in setting of metformin toxicity and sever acidosis, complicated by acute renal failure with difficulty weaning from vent   Severe Hypoxic and Hypercapnic Respiratory Failure  Patient with ongoing temperature spikes. Will broaden antibiotic coverage to  include antifungal therapy, will change vancomycin to linezolid, will perform bronchoscopy with BAL   Renal Failure-most likely due to ATN BUN 119/2.3 Cr with diminished UO yesterday  On IHD as needed per Neephrology  Hypernatremia will start on free water  NEUROLOGY Patient's mental status has remained poor. CT scan of the head was negative. Consult neurology to get their input. This may be just poor clearance of drugs with renal failure  Diffuse mottling noted. We'll send off ANA, rheumatoid factor, sedimentation rate, complements  Critical Care Time devoted to patient care services described in this note is 35 minutes.   Discussed with wife yesterday patient will most likely need tracheostomy  Hermelinda Dellen, DO   Patient ID: Ryan Webb, male   DOB: May 02, 1966, 52 y.o.   MRN: [id number] Patient ID: Ryan Webb, male   DOB: 26-Jul-1965, 52 y.o.   MRN: [id number]

## 2018-01-14 NOTE — Consult Note (Signed)
Pharmacy Antibiotic Note  Ryan Webb is a 52 y.o. male admitted on 04/13/2018 with pneumonia and sepsis.  Pharmacy has been consulted for vancomycin/zosyn dosing. Patient with renal failure, was on CRRT now started on intermittent HD. Plans are for HD 4 hr 4 L today. And follow daily for HD needs. Pt now with fever, elevated WBC  Plan: 07/03 @ 0500 VR 11. Will give patient another vanc 1.5g IV x 1 and will draw a 12 hour vanc level (1700).  Height: 5\' 9"  (175.3 cm) Weight: (!) 301 lb 9.4 oz (136.8 kg) IBW/kg (Calculated) : 70.7  Temp (24hrs), Avg:100.7 F (38.2 C), Min:97.3 F (36.3 C), Max:103.6 F (39.8 C)  Recent Labs  Lab 01/07/18 0811  01/07/18 1600  01/07/18 2359  01/08/18 0822  01/08/18 1550  01/09/18 0945  01/10/18 0228 01/11/18 0428 01/12/18 0535 01/13/18 0350 01/13/18 0942 01/14/18 0205 01/14/18 0430  WBC  --   --   --   --   --    < >  --   --   --    < > 13.4*  --  15.3* 9.7  --  7.5  --  9.9  --   CREATININE 1.26*   < > 1.13   < >  --    < > 1.22   < > 1.23   < >  --    < > 1.75* 2.49* 2.24* 1.99* 1.85*  --  2.30*  LATICACIDVEN 1.7  --  1.4  --  1.8  --  1.8  --  1.4  --   --   --   --   --   --   --   --   --   --   VANCORANDOM  --   --   --   --   --   --   --   --   --   --   --   --   --   --   --  5  --   --  11   < > = values in this interval not displayed.    Estimated Creatinine Clearance: 52.2 mL/min (A) (by C-G formula based on SCr of 2.3 mg/dL (H)).    Allergies  Allergen Reactions  . No Known Allergies     Antimicrobials this admission: zosyn 6/23 >>  vancomycin 6/29 >>   Dose adjustments this admission:   Microbiology results: 6/22 BCx: NG 5 days 6/22 UCx: NG  6/22 Sputum: normal respiratory flora  6/22 MRSA PCR: neg  Thank you for allowing pharmacy to be a part of this patient's care.  Thomasene Rippleavid Lakhia Gengler, PharmD, BCPS Clinical Pharmacist 01/14/2018

## 2018-01-14 NOTE — Progress Notes (Signed)
   01/14/18 1105  Clinical Encounter Type  Visited With Family  Visit Type Follow-up  Spiritual Encounters  Spiritual Needs Emotional;Prayer   Chaplain met with patient's spouse, mother, and other family members in waiting room.  Spouse shared thoughts and feelings regarding patient's ongoing hospitalization, current procedure, and recent medication adjustments.  She indicated that patient has been her primary focus, but she is now looking to balance responsibilities outside of hospital in addition to being focused on spouse.  Chaplain facilitated conversation regarding self-care and impact of stress on a person's physical and emotional well-being.  Patient's spouse identified areas of self-care to which she can tend.  Chaplain prayed with family; then reminded them of ongoing chaplain availability and encouraged them to page for chaplain as needed.

## 2018-01-14 NOTE — Progress Notes (Signed)
[facility], [place] 01/14/18  Subjective:   Critically ill, Sedated, vent support FiO2 40%, tube feeds going at 20 cc/h, patient is continued on amiodarone, propofol, fentanyl, heparin infusions.  He remains tachycardic with heart rate in the 130s.    Urine output is  Recorded at 3300 cc last 24 hours Labs from this morning show sodium of  148 (improved), K 3.8 (improved),  BUN/Creatinine are higher   Objective:  Vital signs in last 24 hours:  Temp:  [98.8 F (37.1 C)-103.6 F (39.8 C)] 99 F (37.2 C) (07/03 0800) Pulse Rate:  [115-151] 128 (07/03 0600) Resp:  [20-32] 32 (07/03 0800) BP: (91-154)/(59-101) 116/84 (07/03 0800) SpO2:  [90 %-100 %] 100 % (07/03 0814) FiO2 (%):  [30 %-50 %] 40 % (07/03 0814) Weight:  [136.8 kg (301 lb 9.4 oz)] 136.8 kg (301 lb 9.4 oz) (07/03 0450)  Weight change: 0.3 kg (10.6 oz) Filed Weights   01/12/18 0240 01/13/18 0406 01/14/18 0450  Weight: (!) 137.8 kg (303 lb 12.7 oz) (!) 136.5 kg (300 lb 14.9 oz) (!) 136.8 kg (301 lb 9.4 oz)    Intake/Output:    Intake/Output Summary (Last 24 hours) at 01/14/2018 6387 Last data filed at 01/14/2018 0800 Gross per 24 hour  Intake 3107.97 ml  Output 2655 ml  Net 452.97 ml     Physical Exam: General:  Critically ill-appearing, laying in the bed  HEENT   ET tube in place  Pulm/lungs  ventilator assisted,    CVS/Heart  sinus tachycardia in the 130s  Abdomen:   Soft, nontender, nondistended, scrotal edema  Extremities:  2+ dependent pitting edema  Neurologic:  Sedated  Skin:  No acute rashes  Access: Rt femoral vascath   Foley catheter in place    Basic Metabolic Panel:  Recent Labs  Lab 01/10/18 0228 01/11/18 0428 01/12/18 0535 01/13/18 0350 01/13/18 0942 01/14/18 0205 01/14/18 0430  NA 137 140 144 153* 154*  --  148*  K 4.4 3.2* 3.6 2.3* 2.9*  --  3.8  CL 104 107 105 112* 115*  --  112*  CO2 _0 32 28  --  24  GLUCOSE 154* 131* 213* 162* 219*  --  215*   BUN 54* 87* 98* 91* 90*  --  119*  CREATININE 1.75* 2.49* 2.24* 1.99* 1.85*  --  2.30*  CALCIUM 8.4* 8.0* 8.2* 8.2* 8.2*  --  8.0*  MG 2.3 2.4 2.2 2.4  --  2.4  --   PHOS 4.1 5.3* 6.2* 3.6  --   --  6.1*     CBC: Recent Labs  Lab 01/09/18 0945 01/10/18 0228 01/11/18 0428 01/13/18 0350 01/14/18 0205  WBC 13.4* 15.3* 9.7 7.5 9.9  NEUTROABS 10.5*  --   --   --   --   HGB 10.1* 9.9* 8.9* 9.0* 10.3*  HCT 31.9* 31.8* 26.9* 28.0* 32.2*  MCV 104.6* 103.8* 102.7* 102.9* 103.7*  PLT 76* 116* 124* 184 278      Lab Results  Component Value Date   HEPBSAG Negative 01/09/2018   HEPBSAB Non Reactive 01/09/2018   HEPBIGM Negative 01/03/2018      Microbiology:  Recent Results (from the past 240 hour(s))  CULTURE, BLOOD (ROUTINE X 2) w Reflex to ID Panel     Status: None (Preliminary result)   Collection Time: 01/10/18  9:35 AM  Result Value Ref Range Status   Specimen Description BLOOD L WRIST  Final   Special Requests  Final    BOTTLES DRAWN AEROBIC AND ANAEROBIC Blood Culture adequate volume   Culture   Final    NO GROWTH 4 DAYS Performed at [facility], [address] [place], [place] [address]    Report Status PENDING  Incomplete  CULTURE, BLOOD (ROUTINE X 2) w Reflex to ID Panel     Status: None (Preliminary result)   Collection Time: 01/10/18  9:47 AM  Result Value Ref Range Status   Specimen Description BLOOD R FOREARM  Final   Special Requests   Final    BOTTLES DRAWN AEROBIC AND ANAEROBIC Blood Culture adequate volume   Culture   Final    NO GROWTH 4 DAYS Performed at [facility], [address], [place], [place] [address]    Report Status PENDING  Incomplete  Culture, respiratory (NON-Expectorated)     Status: None   Collection Time: 01/10/18 12:03 PM  Result Value Ref Range Status   Specimen Description   Final    TRACHEAL ASPIRATE Performed at [facility], [address], [place], [place] [address]    Special Requests    Final    NONE Performed at [facility], [address] [place], [place] [address]    Gram Stain   Final    ABUNDANT WBC PRESENT,BOTH PMN AND MONONUCLEAR RARE SQUAMOUS EPITHELIAL CELLS PRESENT RARE BUDDING YEAST SEEN Performed at [facility], [place] [address] [place], [place] [address]    Culture MODERATE CANDIDA ALBICANS  Final   Report Status 01/13/2018 FINAL  Final  Culture, blood (Routine X 2) w Reflex to ID Panel     Status: None (Preliminary result)   Collection Time: 01/12/18 10:06 PM  Result Value Ref Range Status   Specimen Description BLOOD RIGHT HAND  Final   Special Requests   Final    BOTTLES DRAWN AEROBIC AND ANAEROBIC Blood Culture adequate volume   Culture   Final    NO GROWTH 2 DAYS Performed at [facility], [address], [place], [place] [address]    Report Status PENDING  Incomplete  Culture, blood (Routine X 2) w Reflex to ID Panel     Status: None (Preliminary result)   Collection Time: 01/12/18 10:07 PM  Result Value Ref Range Status   Specimen Description BLOOD RIGHT ANTECUBITAL  Final   Special Requests   Final    BOTTLES DRAWN AEROBIC AND ANAEROBIC Blood Culture results may not be optimal due to an excessive volume of blood received in culture bottles   Culture   Final    NO GROWTH 2 DAYS Performed at [facility], [address] [place], [place] [address]    Report Status PENDING  Incomplete  Culture, respiratory (NON-Expectorated)     Status: None (Preliminary result)   Collection Time: 01/13/18 12:28 AM  Result Value Ref Range Status   Specimen Description   Final    TRACHEAL ASPIRATE Performed at [facility], [address], [place],  [address]    Special Requests   Final    NONE Performed at [facility], [address] [place],  [address]    Gram Stain   Final    ABUNDANT WBC PRESENT, PREDOMINANTLY PMN NO ORGANISMS SEEN Performed at [facility], [place] [address], [place],  [address]    Culture PENDING  Incomplete   Report Status PENDING  Incomplete    Coagulation Studies: No results for input(s): LABPROT, INR in  the last 72 hours.  Urinalysis: No results for input(s): COLORURINE, LABSPEC, PHURINE, GLUCOSEU, HGBUR, BILIRUBINUR, KETONESUR, PROTEINUR, UROBILINOGEN, NITRITE, LEUKOCYTESUR in the last 72 hours.  Invalid input(s): APPERANCEUR    Imaging: Ct Head Wo Contrast  Result Date: 01/14/2018 CLINICAL DATA:  52 y/o  M; altered level of consciousness. EXAM: CT HEAD WITHOUT CONTRAST TECHNIQUE: Contiguous axial images were obtained from the base of the skull through the vertex without intravenous contrast. COMPARISON:  01/03/2018 CT head. FINDINGS: Brain: No evidence of acute infarction, hemorrhage, hydrocephalus, extra-axial collection or mass lesion/mass effect. Few stable nonspecific foci of hypoattenuation within subcortical white matter are compatible with mild chronic microvascular ischemic changes and there is mild brain parenchymal volume loss. Vascular: Mild calcific atherosclerosis of carotid siphons. Skull: Normal. Negative for fracture or focal lesion. Sinuses/Orbits: Small fluid level in the right posterior ethmoid air cells and the sphenoid sinus likely due to intubation. Right mastoid effusion. Other: None. IMPRESSION: No acute intracranial abnormality identified. Stable negative CT of the head. Electronically Signed   By: Kristine Garbe M.D.   On: 01/14/2018 02:42   Ct Chest Wo Contrast  Result Date: 01/14/2018 CLINICAL DATA:  Acute respiratory illness. Interstitial lung disease. Acute renal failure secondary to severe hypotension. Metabolic acidosis. Acute respiratory failure. Generalized edema. EXAM: CT CHEST WITHOUT CONTRAST TECHNIQUE: Multidetector CT imaging of the chest was performed following the standard protocol without IV contrast. COMPARISON:  01/03/2018 FINDINGS: Cardiovascular: Cardiac enlargement. No  pericardial effusion. Normal caliber thoracic aorta. Minimal aortic calcification. Right central venous catheter with tip in the low SVC. Mediastinum/Nodes: Enteric tube with tip in the distal stomach. Esophagus is decompressed. Diffusely enlarged mediastinal lymph nodes. AP window nodes measure up to 13 mm short axis dimension. Right paratracheal nodes measure up to 1.5 cm short axis dimension. Lymph nodes are similar in appearance to previous study. Lungs/Pleura: Motion artifact limits examination. There are small bilateral pleural effusions with consolidation and atelectasis in both lung bases. This appearance is similar to previous study. There appears to be developing airspace infiltration throughout the right lung since previous study. This could represent increasing edema or multifocal pneumonia. No pneumothorax. Airways are patent. Upper Abdomen: Surgical absence of the gallbladder. Musculoskeletal: No chest wall mass or suspicious bone lesions identified. Degenerative changes in the thoracic spine. IMPRESSION: Cardiac enlargement. Small bilateral pleural effusions with consolidation and atelectasis in both lung bases. There appears to be developing airspace disease in the right lung since previous study. This could represent increasing edema or multifocal pneumonia. Mediastinal lymphadenopathy is similar to previous study and likely reactive. Aortic Atherosclerosis (ICD10-I70.0). Electronically Signed   By: Lucienne Capers M.D.   On: 01/14/2018 02:50   Dg Chest Port 1 View  Result Date: 01/13/2018 CLINICAL DATA:  52 year old male with sepsis and respiratory failure. Acute renal failure. Intubated. EXAM: PORTABLE CHEST 1 VIEW COMPARISON:  01/12/2018 and earlier. FINDINGS: Portable AP semi upright view at 0756 hours. Stable endotracheal tube tip at the level the clavicles. Stable right IJ central line. Enteric tube courses to the left upper quadrant, tip not included. Stable lung volumes. No pneumothorax.  Stable pulmonary vascularity without overt edema. Dense retrocardiac and veiling bibasilar opacity is stable. Paucity of bowel gas in the upper abdomen. IMPRESSION: 1.  Stable lines and tubes. 2. Stable ventilation with bilateral lower lobe collapse or consolidation and probable small pleural effusions. Electronically Signed   By: Genevie Ann M.D.   On: 01/13/2018 09:13     Medications:   . sodium chloride  Stopped (01/11/18 1517)  . amiodarone 60 mg/hr (01/14/18 0848)  . anticoagulant sodium citrate    . fentaNYL infusion INTRAVENOUS 75 mcg/hr (01/14/18 0407)  . heparin 1,150 Units/hr (01/14/18 0407)  . piperacillin-tazobactam (ZOSYN)  IV 3.375 g (01/14/18 0521)  . propofol (DIPRIVAN) infusion 20 mcg/kg/min (01/14/18 0402)  . vancomycin     . chlorhexidine gluconate (MEDLINE KIT)  15 mL Mouth Rinse BID  . Chlorhexidine Gluconate Cloth  6 each Topical Q0600  . diltiazem  60 mg Oral Q6H  . sennosides  5 mL Per Tube BID   And  . docusate  100 mg Per Tube BID  . famotidine  20 mg Per Tube BID  . feeding supplement (PRO-STAT SUGAR FREE 64)  60 mL Per Tube 5 X Daily  . feeding supplement (VITAL HIGH PROTEIN)  1,000 mL Per Tube Q24H  . fentaNYL (SUBLIMAZE) injection  50 mcg Intravenous Once  . free water  250 mL Per Tube Q4H  . insulin aspart  0-15 Units Subcutaneous Q4H  . insulin aspart  3 Units Subcutaneous Q4H  . ipratropium-albuterol  3 mL Nebulization Q6H  . mouth rinse  15 mL Mouth Rinse 10 times per day  . multivitamin  15 mL Per Tube Daily  . nutrition supplement (JUVEN)  1 packet Per Tube BID BM  . polyvinyl alcohol  1 drop Both Eyes TID  . sodium chloride flush  10-40 mL Intracatheter Q12H  . sodium chloride flush  3 mL Intravenous Q12H  . thiamine  100 mg Per Tube Daily   sodium chloride, acetaminophen **OR** acetaminophen, anticoagulant sodium citrate, bisacodyl, fentaNYL, iopamidol, ipratropium-albuterol, labetalol, midazolam, midazolam, neomycin-bacitracin-polymyxin, sodium  chloride flush, sodium chloride flush, vancomycin, vecuronium  Assessment/ Plan:  52 y.o. male with diabetes mellitus type 2, hypertension, chronic systolic heart failure ejection fraction 45%, who was admitted to [facility] on6/21/2019for evaluation of abdominal discomfort and bloating. Patient had decompensation on January 03, 2018 with severe hypotension requiring multiple pressors, acute respiratory failure, shock liver and acute renal failure. Started CRRT from 6/22 to 6/28. Intermittent hemodialysis on 6/28. Trial of furosemide with good results on 6/29.   1. Acute renal failure secondary to severe hypotension,and also exposed to iv contrast with CTA. Baseline creatinine 1.14, 12/12/16 2. Hyperkalemia,now hypokalemia 3. Metabolic acidosis with elevated lactic acid level, pt was on metformin and TCAs as outpt.  4. Acute respiratory failure. 5.  Generalized edema   Remains critically ill.  Urine output is good.  Serum creatinine and BUN are worse suggesting intravascular depletion.  Will d/c Lasix for now Consider iv oncotic support with iv albumin Na, K have improved Will follow     LOS: [place] 7/3/20199:03 AM  [place], [place]  Note: This note was prepared with Dragon dictation. Any transcription errors are unintentional

## 2018-01-14 NOTE — Progress Notes (Signed)
[facility]   PATIENT NAME: Ryan Webb    MR#:  [id number]  DATE OF BIRTH:  Jul 10, 1966  SUBJECTIVE:   Patient remains critically ill.  Patient had 3400 normal urine output.   on vent, febrile, tachycardic heart rate around 110s  REVIEW OF SYSTEMS:    unAble to obtain  tolerating Diet: Tube feeds DRUG ALLERGIES:   Allergies  Allergen Reactions  . No Known Allergies     VITALS:  Blood pressure 115/85, pulse (!) 125, temperature (!) 100.8 F (38.2 C), temperature source Esophageal, resp. rate (!) 27, height 5\' 9"  (1.753 m), weight (!) 136.8 kg (301 lb 9.4 oz), SpO2 96 %.  PHYSICAL EXAMINATION:  Constitutional: Appears obese sedated on vent, critically ill HENT: Normocephalic.  Intubated Eyes: , no scleral icterus.  Neck: Normal ROM. Neck supple. No JVD. No tracheal deviation. CVS: Tachycardic S1/S2 +, no murmurs, no gallops, no carotid bruit.  Pulmonary: Effort and breath sounds normal, no stridor, rhonchi, wheezes, rales.  Abdominal: Soft. BS +,  no distension, tenderness, rebound or guarding.  Musculoskeletal: sedated Neuro:sedated     . Skin: Skin is warm and dry. No rash noted.  2+ lower extremity edema Psychiatric: sedated  LABORATORY PANEL:   CBC Recent Labs  Lab 01/14/18 0205  WBC 9.9  HGB 10.3*  HCT 32.2*  PLT 278   ------------------------------------------------------------------------------------------------------------------  Chemistries  Recent Labs  Lab 01/08/18 1223  01/14/18 0205 01/14/18 0430  NA 136  135   < >  --  148*  K 4.7  4.6   < >  --  3.8  CL 103  102   < >  --  112*  CO2 24  25   < >  --  24  GLUCOSE 122*  123*   < >  --  215*  BUN 36*  38*   < >  --  119*  CREATININE 1.22  1.29*   < >  --  2.30*  CALCIUM 8.6*  8.5*   < >  --  8.0*  MG 2.0   < > 2.4  --   AST 134*  --   --   --   ALT 833*  --   --   --   ALKPHOS 102  --   --   --   BILITOT 1.8*  --   --   --    < > = values in  this interval not displayed.   ------------------------------------------------------------------------------------------------------------------  Cardiac Enzymes No results for input(s): TROPONINI in the last 168 hours. ------------------------------------------------------------------------------------------------------------------  RADIOLOGY:  Ct Head Wo Contrast  Result Date: 01/14/2018 CLINICAL DATA:  52 y/o  M; altered level of consciousness. EXAM: CT HEAD WITHOUT CONTRAST TECHNIQUE: Contiguous axial images were obtained from the base of the skull through the vertex without intravenous contrast. COMPARISON:  01/03/2018 CT head. FINDINGS: Brain: No evidence of acute infarction, hemorrhage, hydrocephalus, extra-axial collection or mass lesion/mass effect. Few stable nonspecific foci of hypoattenuation within subcortical white matter are compatible with mild chronic microvascular ischemic changes and there is mild brain parenchymal volume loss. Vascular: Mild calcific atherosclerosis of carotid siphons. Skull: Normal. Negative for fracture or focal lesion. Sinuses/Orbits: Small fluid level in the right posterior ethmoid air cells and the sphenoid sinus likely due to intubation. Right mastoid effusion. Other: None. IMPRESSION: No acute intracranial abnormality identified. Stable negative CT of the head. Electronically Signed   By: Mitzi Hansen M.D.   On: 01/14/2018  02:42   Ct Chest Wo Contrast  Result Date: 01/14/2018 CLINICAL DATA:  Acute respiratory illness. Interstitial lung disease. Acute renal failure secondary to severe hypotension. Metabolic acidosis. Acute respiratory failure. Generalized edema. EXAM: CT CHEST WITHOUT CONTRAST TECHNIQUE: Multidetector CT imaging of the chest was performed following the standard protocol without IV contrast. COMPARISON:  01/03/2018 FINDINGS: Cardiovascular: Cardiac enlargement. No pericardial effusion. Normal caliber thoracic aorta. Minimal aortic  calcification. Right central venous catheter with tip in the low SVC. Mediastinum/Nodes: Enteric tube with tip in the distal stomach. Esophagus is decompressed. Diffusely enlarged mediastinal lymph nodes. AP window nodes measure up to 13 mm short axis dimension. Right paratracheal nodes measure up to 1.5 cm short axis dimension. Lymph nodes are similar in appearance to previous study. Lungs/Pleura: Motion artifact limits examination. There are small bilateral pleural effusions with consolidation and atelectasis in both lung bases. This appearance is similar to previous study. There appears to be developing airspace infiltration throughout the right lung since previous study. This could represent increasing edema or multifocal pneumonia. No pneumothorax. Airways are patent. Upper Abdomen: Surgical absence of the gallbladder. Musculoskeletal: No chest wall mass or suspicious bone lesions identified. Degenerative changes in the thoracic spine. IMPRESSION: Cardiac enlargement. Small bilateral pleural effusions with consolidation and atelectasis in both lung bases. There appears to be developing airspace disease in the right lung since previous study. This could represent increasing edema or multifocal pneumonia. Mediastinal lymphadenopathy is similar to previous study and likely reactive. Aortic Atherosclerosis (ICD10-I70.0). Electronically Signed   By: Burman NievesWilliam  Stevens M.D.   On: 01/14/2018 02:50   Dg Chest Port 1 View  Result Date: 01/13/2018 CLINICAL DATA:  52 year old male with sepsis and respiratory failure. Acute renal failure. Intubated. EXAM: PORTABLE CHEST 1 VIEW COMPARISON:  01/12/2018 and earlier. FINDINGS: Portable AP semi upright view at 0756 hours. Stable endotracheal tube tip at the level the clavicles. Stable right IJ central line. Enteric tube courses to the left upper quadrant, tip not included. Stable lung volumes. No pneumothorax. Stable pulmonary vascularity without overt edema. Dense  retrocardiac and veiling bibasilar opacity is stable. Paucity of bowel gas in the upper abdomen. IMPRESSION: 1.  Stable lines and tubes. 2. Stable ventilation with bilateral lower lobe collapse or consolidation and probable small pleural effusions. Electronically Signed   By: Odessa FlemingH  Hall M.D.   On: 01/13/2018 09:13     ASSESSMENT AND PLAN:   52 year old male with obesity, diabetes, hypertension came in with tachyarrhythmia and was noted to be in septic shock  * Septic shock with multiorgan failure.- PNA -Continue vent management as per ICU -Sputum culture obtained from tracheal aspirate is positive for gram-positive cocci in clusters and gram-negative coccobacilli 6/22 -IV zyvox,  eraxis empirically.  Follow-up on the cultures 6/29 -pt is s/o bronchoscopy with sputum cx pending -Previous blood cultures obtained on 01/03/2018 are negative.  Urine culture obtained on 01/03/2018 is also negative -MRSA PCR is negative   * Hypernatremia with hypokalemia D5W replete potassium  * Ectopic atrial tachycardia versus atrial flutter-remains tachycardic CT Angio of the chest and pelvis negative for any PE or aortic dissection. Echocardiogram with LV ejection fraction of 45% which is stable from prior. Platelet  count is better and patient is started on heparin drip.  Monitor platelet count closely Amiodarone drip -IV amiodarone gtt per cardiolgoy  * Acute renal failure with metabolic acidosis due to ATN from septic shock and contrast exposure due to CT scans.  Baseline creatinine 1.14 Patient was taking metformin and  TCA as an outpatient, had hemodialysis  6/28.  CRRT discontinued Management per nephrology  *  Thrombocytopenia due to sepsis: Platelet count is increasing. Hit panel negative Last Platelets at 184,000  *Acute respiratory failure: Due to sepsis and multiorgan failure Remains on ventilator and management per ICU team  * Elevated LFTs due to shock liver with multiorgan failure due to  sepsis.  LFTs are improving GI consultation appreciated  *  Nutrition: Continue tube feeds- vital Overall prognosis guarded.  Patient is critically ill.  Poor prognosis with high mortality and morbidity   CODE STATUS: FULL  TOTAL TIME TAKING CARE OF THIS PATIENT: 25 minutes.     POSSIBLE D/C ??, DEPENDING ON CLINICAL CONDITION.   Enedina Finner M.D on 01/14/2018 at 1:57 PM  Between 7am to 6pm - Pager - [phone] After 6pm go to [email or web address] - password [email or web address]  [facility]  Office  [phone]  CC: Primary care physician; Anola Gurney, PA  Note: This dictation was prepared with Dragon dictation along with smaller phrase technology. Any transcriptional errors that result from this process are unintentional.

## 2018-01-14 NOTE — Consult Note (Signed)
Pharmacy Antibiotic Note  Ryan Webb is a 52 y.o. male admitted on 12/16/2017 with pneumonia and sepsis.  Pharmacy has been consulted for vancomycin/zosyn dosing. Patient with renal failure, was on CRRT now started on intermittent HD.   Plan: Will continue Zosyn 3.375 g 8 hours for now.   After discussion with Dr. Lonn Georgiaonforti, will transition from vancomycin to linezolid due to risk of continued renal insult with vancomycin in the setting of ARF. Will order linezolid 600 mg iv q 12 hours.   After discussion with Dr. Lonn Georgiaonforti, will begin Eraxis with continued fevers despite broad-spectrum antibiotic therapy. Will order Eraxis 200 mg iv once followed by 100 mg iv q 24 hours.   Height: 5\' 9"  (175.3 cm) Weight: (!) 301 lb 9.4 oz (136.8 kg) IBW/kg (Calculated) : 70.7  Temp (24hrs), Avg:101.7 F (38.7 C), Min:99 F (37.2 C), Max:103.6 F (39.8 C)  Recent Labs  Lab 01/07/18 1600  01/07/18 2359  01/08/18 0822  01/08/18 1550  01/09/18 0945  01/10/18 0228 01/11/18 0428 01/12/18 0535 01/13/18 0350 01/13/18 0942 01/14/18 0205 01/14/18 0430  WBC  --   --   --    < >  --   --   --    < > 13.4*  --  15.3* 9.7  --  7.5  --  9.9  --   CREATININE 1.13   < >  --    < > 1.22   < > 1.23   < >  --    < > 1.75* 2.49* 2.24* 1.99* 1.85*  --  2.30*  LATICACIDVEN 1.4  --  1.8  --  1.8  --  1.4  --   --   --   --   --   --   --   --   --   --   VANCORANDOM  --   --   --   --   --   --   --   --   --   --   --   --   --  5  --   --  11   < > = values in this interval not displayed.    Estimated Creatinine Clearance: 52.2 mL/min (A) (by C-G formula based on SCr of 2.3 mg/dL (H)).    Allergies  Allergen Reactions  . No Known Allergies     Antimicrobials this admission: zosyn 6/23 >>  vancomycin 6/29 >> 7/3 linezolid 7/3 >> anidulafungin 7/3 >>  Dose adjustments this admission:   Microbiology results: 7/3 TA: pending  7/2 TA: pending 7/2 UCx: NG 7/1 BCx: NGTD 6/29 BCx: NGTD 6/28 TA:  moderate Candida albicans 6/22 BCx: NG 5 days 6/22 UCx: NG  6/22 Sputum: normal respiratory flora  6/22 MRSA PCR: neg  Thank you for allowing pharmacy to be a part of this patient's care.  Luisa HartScott Jemiah Ellenburg, PharmD Clinical Pharmacist  01/14/2018

## 2018-01-14 NOTE — Progress Notes (Signed)
ANTICOAGULATION CONSULT NOTE - Follow Up Consult  Pharmacy Consult for Heparin Drip Indication: atrial fibrillation  Allergies  Allergen Reactions  . No Known Allergies     Patient Measurements: Height: 5\' 9"  (175.3 cm) Weight: (!) 301 lb 9.4 oz (136.8 kg) IBW/kg (Calculated) : 70.7 Heparin Dosing Weight: 98.9 kg  Vital Signs: Temp: 99 F (37.2 C) (07/03 0800) Temp Source: Esophageal (07/03 0800) BP: 122/80 (07/03 1100) Pulse Rate: 125 (07/03 1100)  Labs: Recent Labs    01/13/18 0350 01/13/18 0942  01/13/18 1912 01/14/18 0205 01/14/18 0430 01/14/18 0816  HGB 9.0*  --   --   --  10.3*  --   --   HCT 28.0*  --   --   --  32.2*  --   --   PLT 184  --   --   --  278  --   --   HEPARINUNFRC 0.98*  --    < > 0.80* 0.84*  --  0.70  CREATININE 1.99* 1.85*  --   --   --  2.30*  --    < > = values in this interval not displayed.    Estimated Creatinine Clearance: 52.2 mL/min (A) (by C-G formula based on SCr of 2.3 mg/dL (H)).  52 y/o M admitted with septic shock on heparin drip for atrial flutter.   Goal of Therapy:  Heparin level 0.3-0.7 units/ml Monitor platelets by anticoagulation protocol: Yes   Assessment/Plan:  Heparin level now at goal but high end of goal range. Will decrease heparin infusion slightly to 1100 units/hr and recheck a HL in 6 hours.   Luisa HartScott Donyea Gafford, PharmD Clinical Pharmacist  01/14/2018

## 2018-01-14 NOTE — Progress Notes (Signed)
ANTICOAGULATION CONSULT NOTE - Follow Up Consult  Pharmacy Consult for Heparin Drip Indication: atrial fibrillation  Allergies  Allergen Reactions  . No Known Allergies     Patient Measurements: Height: 5\' 9"  (175.3 cm) Weight: (!) 301 lb 9.4 oz (136.8 kg) IBW/kg (Calculated) : 70.7 Heparin Dosing Weight: 98.9 kg  Vital Signs: Temp: 99.1 F (37.3 C) (07/03 1600) Temp Source: Esophageal (07/03 1600) BP: 128/73 (07/03 1600) Pulse Rate: 125 (07/03 1100)  Labs: Recent Labs    01/13/18 0350 01/13/18 0942  01/14/18 0205 01/14/18 0430 01/14/18 0816 01/14/18 1042 01/14/18 1616  HGB 9.0*  --   --  10.3*  --   --   --   --   HCT 28.0*  --   --  32.2*  --   --   --   --   PLT 184  --   --  278  --   --   --   --   HEPARINUNFRC 0.98*  --    < > 0.84*  --  0.70  --  0.71*  CREATININE 1.99* 1.85*  --   --  2.30*  --   --   --   CKTOTAL  --   --   --   --   --   --  55  --    < > = values in this interval not displayed.    Estimated Creatinine Clearance: 52.2 mL/min (A) (by C-G formula based on SCr of 2.3 mg/dL (H)).  52 y/o M admitted with septic shock on heparin drip for atrial flutter.   Goal of Therapy:  Heparin level 0.3-0.7 units/ml Monitor platelets by anticoagulation protocol: Yes   Assessment/Plan:  Heparin level now 0.71, slightly supratherapeutic Will decrease heparin infusion slightly to 1050 units/hr and recheck a HL with AM labs.      Luan PullingGarrett Berlene Dixson, PharmD, MBA, BCGP Clinical Pharmacist  01/14/2018

## 2018-01-14 NOTE — Progress Notes (Signed)
ANTICOAGULATION CONSULT NOTE - Follow Up Consult  Pharmacy Consult for Heparin Drip Indication: atrial fibrillation  Allergies  Allergen Reactions  . No Known Allergies     Patient Measurements: Height: 5\' 9"  (175.3 cm) Weight: (!) 300 lb 14.9 oz (136.5 kg) IBW/kg (Calculated) : 70.7 Heparin Dosing Weight: 98.9 kg  Vital Signs: Temp: 103.3 F (39.6 C) (07/03 0000) Temp Source: Esophageal (07/03 0000) BP: 137/92 (07/03 0100) Pulse Rate: 139 (07/03 0100)  Labs: Recent Labs    01/11/18 0428 01/12/18 0535 01/13/18 0350 01/13/18 0942  01/13/18 1804 01/13/18 1912 01/14/18 0205  HGB 8.9*  --  9.0*  --   --   --   --  10.3*  HCT 26.9*  --  28.0*  --   --   --   --  32.2*  PLT 124*  --  184  --   --   --   --  278  HEPARINUNFRC 0.65 0.68 0.98*  --    < > 0.79* 0.80* 0.84*  CREATININE 2.49* 2.24* 1.99* 1.85*  --   --   --   --    < > = values in this interval not displayed.    Estimated Creatinine Clearance: 64.8 mL/min (A) (by C-G formula based on SCr of 1.85 mg/dL (H)).  52 y/o M admitted with septic shock on heparin drip for atrial flutter.   Goal of Therapy:  Heparin level 0.3-0.7 units/ml Monitor platelets by anticoagulation protocol: Yes   Assessment/Plan:  07/03 @ 0200 HL 0.84 supratherapeutic. Will decrease rate to 1150 units/hr and will recheck @ 0800. CBC stable.  Thomasene Rippleavid Richey Doolittle, PharmD, BCPS Clinical Pharmacist 01/14/2018

## 2018-01-14 NOTE — Progress Notes (Addendum)
Patient ID: Ryan Webb, male   DOB: September 03, 1965, 52 y.o.   MRN: [id number] Pulmonary/critical care  Procedure note Bronchoscopy Indication: Ongoing temperature spikes with right lower lobe pneumonia and consolidation Consent is obtained by over phone discussion with wife Bronchoscope inserted through an adapter attached to the endotracheal tube Secretions were noted bilaterally worse in the right mainstem bronchus. Secretions were clear on the right and the left BAL with 15 mL injected into the right lower lobe bronchus with 15 mL return Thick yellow-green secretions were noted Patient had a brief desaturation  Was briefly bagged and ventilator adjusted. Will send for appropriate respiratory culture data  Hermelinda Dellen, D.O.

## 2018-01-15 ENCOUNTER — Inpatient Hospital Stay: Payer: BLUE CROSS/BLUE SHIELD

## 2018-01-15 DIAGNOSIS — R400 Somnolence: Secondary | ICD-10-CM

## 2018-01-15 LAB — CULTURE, BLOOD (ROUTINE X 2)
CULTURE: NO GROWTH
Culture: NO GROWTH
SPECIAL REQUESTS: ADEQUATE
Special Requests: ADEQUATE

## 2018-01-15 LAB — GLUCOSE, CAPILLARY
GLUCOSE-CAPILLARY: 227 mg/dL — AB (ref 70–99)
GLUCOSE-CAPILLARY: 202 mg/dL — AB (ref 70–99)
Glucose-Capillary: 188 mg/dL — ABNORMAL HIGH (ref 70–99)
Glucose-Capillary: 225 mg/dL — ABNORMAL HIGH (ref 70–99)
Glucose-Capillary: 232 mg/dL — ABNORMAL HIGH (ref 70–99)
Glucose-Capillary: 235 mg/dL — ABNORMAL HIGH (ref 70–99)

## 2018-01-15 LAB — CBC
HCT: 32.7 % — ABNORMAL LOW (ref 40.0–52.0)
Hemoglobin: 10.3 g/dL — ABNORMAL LOW (ref 13.0–18.0)
MCH: 33 pg (ref 26.0–34.0)
MCHC: 31.6 g/dL — ABNORMAL LOW (ref 32.0–36.0)
MCV: 104.6 fL — ABNORMAL HIGH (ref 80.0–100.0)
PLATELETS: 300 10*3/uL (ref 150–440)
RBC: 3.13 MIL/uL — AB (ref 4.40–5.90)
RDW: 19.3 % — ABNORMAL HIGH (ref 11.5–14.5)
WBC: 15.3 10*3/uL — AB (ref 3.8–10.6)

## 2018-01-15 LAB — RENAL FUNCTION PANEL
ANION GAP: 12 (ref 5–15)
Albumin: 3 g/dL — ABNORMAL LOW (ref 3.5–5.0)
BUN: 139 mg/dL — ABNORMAL HIGH (ref 6–20)
CALCIUM: 8.1 mg/dL — AB (ref 8.9–10.3)
CO2: 26 mmol/L (ref 22–32)
Chloride: 110 mmol/L (ref 98–111)
Creatinine, Ser: 2.43 mg/dL — ABNORMAL HIGH (ref 0.61–1.24)
GFR calc Af Amer: 34 mL/min — ABNORMAL LOW (ref >60)
GFR calc non Af Amer: 29 mL/min — ABNORMAL LOW (ref >60)
Glucose, Bld: 257 mg/dL — ABNORMAL HIGH (ref 70–99)
PHOSPHORUS: 5.2 mg/dL — AB (ref 2.5–4.6)
POTASSIUM: 4.1 mmol/L (ref 3.5–5.1)
SODIUM: 148 mmol/L — AB (ref 135–145)

## 2018-01-15 LAB — CULTURE, RESPIRATORY W GRAM STAIN

## 2018-01-15 LAB — MAGNESIUM: Magnesium: 2.5 mg/dL — ABNORMAL HIGH (ref 1.7–2.4)

## 2018-01-15 LAB — ANA: ANA: POSITIVE — AB

## 2018-01-15 LAB — C4 COMPLEMENT: Complement C4, Body Fluid: 24 mg/dL (ref 14–44)

## 2018-01-15 LAB — CULTURE, RESPIRATORY

## 2018-01-15 LAB — C3 COMPLEMENT: C3 Complement: 105 mg/dL (ref 82–167)

## 2018-01-15 LAB — HEPARIN LEVEL (UNFRACTIONATED)
HEPARIN UNFRACTIONATED: 0.58 [IU]/mL (ref 0.30–0.70)
HEPARIN UNFRACTIONATED: 0.55 [IU]/mL (ref 0.30–0.70)

## 2018-01-15 LAB — RHEUMATOID FACTOR: Rhuematoid fact SerPl-aCnc: <10 IU/mL (ref 0.0–13.9)

## 2018-01-15 LAB — COMPLEMENT, TOTAL: Compl, Total (CH50): >60 U/mL (ref >41)

## 2018-01-15 MED ORDER — MAGNESIUM HYDROXIDE 400 MG/5ML PO SUSP
60.0000 mL | Freq: Once | ORAL | Status: AC
  Administered 2018-01-15: 60 mL
  Filled 2018-01-15: qty 60

## 2018-01-15 MED ORDER — FUROSEMIDE 10 MG/ML IJ SOLN
60.0000 mg | Freq: Once | INTRAMUSCULAR | Status: AC
  Administered 2018-01-15: 60 mg via INTRAVENOUS
  Filled 2018-01-15: qty 6

## 2018-01-15 MED ORDER — METOCLOPRAMIDE HCL 5 MG/ML IJ SOLN
5.0000 mg | Freq: Three times a day (TID) | INTRAMUSCULAR | Status: AC
  Administered 2018-01-15 – 2018-01-17 (×6): 5 mg via INTRAVENOUS
  Filled 2018-01-15 (×6): qty 2

## 2018-01-15 NOTE — Progress Notes (Signed)
ANTICOAGULATION CONSULT NOTE - Follow Up Consult  Pharmacy Consult for Heparin Drip Indication: atrial fibrillation  Allergies  Allergen Reactions  . No Known Allergies     Patient Measurements: Height: 5\' 9"  (175.3 cm) Weight: (!) 319 lb 14.2 oz (145.1 kg) IBW/kg (Calculated) : 70.7 Heparin Dosing Weight: 98.9 kg  Vital Signs: Temp: 100.4 F (38 C) (07/04 0400) Temp Source: Esophageal (07/04 0400) BP: 110/80 (07/04 1000) Pulse Rate: 117 (07/04 1100)  Labs: Recent Labs    01/13/18 0350 01/13/18 0942  01/14/18 0205 01/14/18 0430  01/14/18 1042 01/14/18 1616 01/15/18 0440 01/15/18 1103  HGB 9.0*  --   --  10.3*  --   --   --   --  10.3*  --   HCT 28.0*  --   --  32.2*  --   --   --   --  32.7*  --   PLT 184  --   --  278  --   --   --   --  300  --   HEPARINUNFRC 0.98*  --    < > 0.84*  --    < >  --  0.71* 0.58 0.55  CREATININE 1.99* 1.85*  --   --  2.30*  --   --   --  2.43*  --   CKTOTAL  --   --   --   --   --   --  55  --   --   --    < > = values in this interval not displayed.    Estimated Creatinine Clearance: 51.1 mL/min (A) (by C-G formula based on SCr of 2.43 mg/dL (H)).  52 y/o M admitted with septic shock on heparin drip for atrial flutter.   Goal of Therapy:  Heparin level 0.3-0.7 units/ml Monitor platelets by anticoagulation protocol: Yes   Assessment/Plan:  07/04 @ 0500 HL 0.58 therapeutic. Will continue at 1050 units/hr and will recheck @ 1100.  07/04 11:29 HL therapeutic x 2. Continue current rate. Pharmacy will continue to follow HL and CBC daily per protocol.  Temperence Zenor A. Dahlia Bailiffookson, PharmD, BCPS Clinical Pharmacist /01/15/2018

## 2018-01-15 NOTE — Progress Notes (Signed)
  Amiodarone Drug - Drug Interaction Consult Note  Recommendations: Potassium this morning is 4.1, magnesium 2.5.  Will continue to follow and evaluate for drug interactions.   Potassium (mmol/L)  Date Value  01/15/2018 4.1   Magnesium (mg/dL)  Date Value  16/10/960407/10/2017 2.5 (H)    Amiodarone is metabolized by the cytochrome P450 system and therefore has the potential to cause many drug interactions. Amiodarone has an average plasma half-life of 50 days (range 20 to 100 days).   There is potential for drug interactions to occur several weeks or months after stopping treatment and the onset of drug interactions may be slow after initiating amiodarone.   []  Statins: Increased risk of myopathy. Simvastatin- restrict dose to 20mg  daily. Other statins: counsel patients to report any muscle pain or weakness immediately.  [x]  Anticoagulants: Amiodarone can increase anticoagulant effect. Consider warfarin dose reduction. Patients should be monitored closely and the dose of anticoagulant altered accordingly, remembering that amiodarone levels take several weeks to stabilize.  []  Antiepileptics: Amiodarone can increase plasma concentration of phenytoin, the dose should be reduced. Note that small changes in phenytoin dose can result in large changes in levels. Monitor patient and counsel on signs of toxicity.  [x]  Beta blockers: increased risk of bradycardia, AV block and myocardial depression. Sotalol - avoid concomitant use.  [x]   Calcium channel blockers (diltiazem and verapamil): increased risk of bradycardia, AV block and myocardial depression.  []   Cyclosporine: Amiodarone increases levels of cyclosporine. Reduced dose of cyclosporine is recommended.  []  Digoxin dose should be halved when amiodarone is started.  []  Diuretics: increased risk of cardiotoxicity if hypokalemia occurs.  []  Oral hypoglycemic agents (glyburide, glipizide, glimepiride): increased risk of hypoglycemia. Patient's  glucose levels should be monitored closely when initiating amiodarone therapy.   []  Drugs that prolong the QT interval:  Torsades de pointes risk may be increased with concurrent use - avoid if possible.  Monitor QTc, also keep magnesium/potassium WNL if concurrent therapy can't be avoided. Marland Kitchen. Antibiotics: e.g. fluoroquinolones, erythromycin. . Antiarrhythmics: e.g. quinidine, procainamide, disopyramide, sotalol. . Antipsychotics: e.g. phenothiazines, haloperidol.  . Lithium, tricyclic antidepressants, and methadone. Thank You,  Carola Frostathan A Danen Lapaglia  01/15/2018 2:09 PM

## 2018-01-15 NOTE — Progress Notes (Signed)
Pt with episodes of o2 sats decreasing. Pt not synchronized with the ventilator. Pt gotten multiple PRNs and continues to be have trouble oxygenating. MD and RT notified. Plan to give vecuronium and repeat CXR if episodes persist.

## 2018-01-15 NOTE — Progress Notes (Signed)
[facility], [place] 01/15/18  Subjective:   Critically ill, Sedated, vent support FiO2 50%, tube feeds on hold,  patient is continued on amiodarone, fentanyl, heparin infusions.   He remains tachycardic with heart rate in the 120's.    Urine output is  Recorded at 2640 cc last 24 hours Labs from this morning show sodium of  148 (improved), K 4.1  BUN/Creatinine are worse Spiking fevers, cooling blanket, started linezolid   Objective:  Vital signs in last 24 hours:  Temp:  [99.1 F (37.3 C)-100.8 F (38.2 C)] 100.4 F (38 C) (07/04 0400) Pulse Rate:  [114-125] 121 (07/04 0500) Resp:  [25-37] 25 (07/04 0600) BP: (112-141)/(45-105) 118/87 (07/04 0600) SpO2:  [91 %-100 %] 100 % (07/04 0749) FiO2 (%):  [40 %-80 %] 50 % (07/04 0749) Weight:  [145.1 kg (319 lb 14.2 oz)] 145.1 kg (319 lb 14.2 oz) (07/04 0438)  Weight change: 8.3 kg (18 lb 4.8 oz) Filed Weights   01/13/18 0406 01/14/18 0450 01/15/18 0438  Weight: (!) 136.5 kg (300 lb 14.9 oz) (!) 136.8 kg (301 lb 9.4 oz) (!) 145.1 kg (319 lb 14.2 oz)    Intake/Output:    Intake/Output Summary (Last 24 hours) at 01/15/2018 0807 Last data filed at 01/15/2018 0600 Gross per 24 hour  Intake 1101.13 ml  Output 2465 ml  Net -1363.87 ml     Physical Exam: General:  Critically ill-appearing, laying in the bed  HEENT   ET tube in place  Pulm/lungs  ventilator assisted,    CVS/Heart  sinus tachycardia in the 120s  Abdomen:   Soft, nontender, nondistended, scrotal edema  Extremities:  2+ generalized pitting edema  Neurologic:  Sedated  Skin:  mottling rt leg and rt arm  Access: Rt femoral vascath   Foley catheter in place    Basic Metabolic Panel:  Recent Labs  Lab 01/11/18 0428 01/12/18 0535 01/13/18 0350 01/13/18 0942 01/14/18 0205 01/14/18 0430 01/15/18 0440  NA 140 144 153* 154*  --  148* 148*  K 3.2* 3.6 2.3* 2.9*  --  3.8 4.1  CL 107 105 112* 115*  --  112* 110  CO2 25 29 32 28  --   24 26  GLUCOSE 131* 213* 162* 219*  --  215* 257*  BUN 87* 98* 91* 90*  --  119* 139*  CREATININE 2.49* 2.24* 1.99* 1.85*  --  2.30* 2.43*  CALCIUM 8.0* 8.2* 8.2* 8.2*  --  8.0* 8.1*  MG 2.4 2.2 2.4  --  2.4  --  2.5*  PHOS 5.3* 6.2* 3.6  --   --  6.1* 5.2*     CBC: Recent Labs  Lab 01/09/18 0945 01/10/18 0228 01/11/18 0428 01/13/18 0350 01/14/18 0205 01/15/18 0440  WBC 13.4* 15.3* 9.7 7.5 9.9 15.3*  NEUTROABS 10.5*  --   --   --   --   --   HGB 10.1* 9.9* 8.9* 9.0* 10.3* 10.3*  HCT 31.9* 31.8* 26.9* 28.0* 32.2* 32.7*  MCV 104.6* 103.8* 102.7* 102.9* 103.7* 104.6*  PLT 76* 116* 124* 184 278 300      Lab Results  Component Value Date   HEPBSAG Negative 01/09/2018   HEPBSAB Non Reactive 01/09/2018   HEPBIGM Negative 01/03/2018      Microbiology:  Recent Results (from the past 240 hour(s))  CULTURE, BLOOD (ROUTINE X 2) w Reflex to ID Panel     Status: None   Collection Time: 01/10/18  9:35 AM  Result  Value Ref Range Status   Specimen Description BLOOD L WRIST  Final   Special Requests   Final    BOTTLES DRAWN AEROBIC AND ANAEROBIC Blood Culture adequate volume   Culture   Final    NO GROWTH 5 DAYS Performed at [facility], [address] [place], [place] [address]    Report Status 01/15/2018 FINAL  Final  CULTURE, BLOOD (ROUTINE X 2) w Reflex to ID Panel     Status: None   Collection Time: 01/10/18  9:47 AM  Result Value Ref Range Status   Specimen Description BLOOD R FOREARM  Final   Special Requests   Final    BOTTLES DRAWN AEROBIC AND ANAEROBIC Blood Culture adequate volume   Culture   Final    NO GROWTH 5 DAYS Performed at [facility], [address], [place], [place] [address]    Report Status 01/15/2018 FINAL  Final  Culture, respiratory (NON-Expectorated)     Status: None   Collection Time: 01/10/18 12:03 PM  Result Value Ref Range Status   Specimen Description   Final    TRACHEAL ASPIRATE Performed at [facility], [address] [place], [place] [address]    Special Requests   Final    NONE Performed at [facility], [address] [place], [place] [address]    Gram Stain   Final    ABUNDANT WBC PRESENT,BOTH PMN AND MONONUCLEAR RARE SQUAMOUS EPITHELIAL CELLS PRESENT RARE BUDDING YEAST SEEN Performed at [facility], [place] [address], [place], [place] [address]    Culture MODERATE CANDIDA ALBICANS  Final   Report Status 01/13/2018 FINAL  Final  Culture, blood (Routine X 2) w Reflex to ID Panel     Status: None (Preliminary result)   Collection Time: 01/12/18 10:06 PM  Result Value Ref Range Status   Specimen Description BLOOD RIGHT HAND  Final   Special Requests   Final    BOTTLES DRAWN AEROBIC AND ANAEROBIC Blood Culture adequate volume   Culture   Final    NO GROWTH 3 DAYS Performed at [facility], [address] [place], [place] [address]    Report Status PENDING  Incomplete  Culture, blood (Routine X 2) w Reflex to ID Panel     Status: None (Preliminary result)   Collection Time: 01/12/18 10:07 PM  Result Value Ref Range Status   Specimen Description BLOOD RIGHT ANTECUBITAL  Final   Special Requests   Final    BOTTLES DRAWN AEROBIC AND ANAEROBIC Blood Culture results may not be optimal due to an excessive volume of blood received in culture bottles   Culture   Final    NO GROWTH 3 DAYS Performed at [facility], [address] [place], [place] [address]    Report Status PENDING  Incomplete  Culture, respiratory (NON-Expectorated)     Status: None   Collection Time: 01/13/18 12:28 AM  Result Value Ref Range Status   Specimen Description   Final    TRACHEAL ASPIRATE Performed at [facility], [address], [place], [place] [address]    Special Requests   Final    NONE Performed at [facility], [address] [place], [place] [address]    Gram Stain   Final    ABUNDANT WBC PRESENT, PREDOMINANTLY PMN NO  ORGANISMS SEEN Performed at [facility], [place] [address] [place], [place] [address]    Culture FEW CANDIDA ALBICANS  Final  Report Status 01/15/2018 FINAL  Final  Urine Culture     Status: None   Collection Time: 01/13/18  3:50 AM  Result Value Ref Range Status   Specimen Description   Final    URINE, RANDOM Performed at [facility], [address] [place], [place] [address]    Special Requests   Final    NONE Performed at [facility], [address] [place], [place] [address]    Culture   Final    NO GROWTH Performed at [facility], [place] [address] [place], [place] [address]    Report Status 01/14/2018 FINAL  Final  Culture, respiratory (NON-Expectorated)     Status: None (Preliminary result)   Collection Time: 01/14/18 11:07 AM  Result Value Ref Range Status   Specimen Description   Final    TRACHEAL ASPIRATE Performed at [facility], [address], [place], [place] [address]    Special Requests   Final    NONE Performed at [facility], [address] [place], [place] [address]    Gram Stain   Final    RARE WBC PRESENT, PREDOMINANTLY PMN NO ORGANISMS SEEN Performed at [facility], [place] [address], [place],  [address]    Culture PENDING  Incomplete   Report Status PENDING  Incomplete    Coagulation Studies: No results for input(s): LABPROT, INR in the last 72 hours.  Urinalysis: No results for input(s): COLORURINE, LABSPEC, PHURINE, GLUCOSEU, HGBUR, BILIRUBINUR, KETONESUR, PROTEINUR, UROBILINOGEN, NITRITE, LEUKOCYTESUR in the last 72 hours.  Invalid input(s): APPERANCEUR    Imaging: Dg Abd 1 View  Result Date: 01/15/2018 CLINICAL DATA:  Vomiting. EXAM: ABDOMEN - 1 VIEW COMPARISON:  01/12/2018 FINDINGS: Artifact from pad limits evaluation. Abdomen is incompletely included within the field of view. There is scattered gas in the colon without abnormal small or large bowel distention. An  enteric tube is present in the upper mid abdomen consistent with location in the body of the stomach. Tubing projected over the right femoral region may represent a femoral venous catheter. Degenerative changes in the spine. IMPRESSION: Nonobstructive bowel gas pattern. Enteric tube tip projects over the body of the stomach. Electronically Signed   By: Lucienne Capers M.D.   On: 01/15/2018 04:42   Ct Head Wo Contrast  Result Date: 01/14/2018 CLINICAL DATA:  52 y/o  M; altered level of consciousness. EXAM: CT HEAD WITHOUT CONTRAST TECHNIQUE: Contiguous axial images were obtained from the base of the skull through the vertex without intravenous contrast. COMPARISON:  01/03/2018 CT head. FINDINGS: Brain: No evidence of acute infarction, hemorrhage, hydrocephalus, extra-axial collection or mass lesion/mass effect. Few stable nonspecific foci of hypoattenuation within subcortical white matter are compatible with mild chronic microvascular ischemic changes and there is mild brain parenchymal volume loss. Vascular: Mild calcific atherosclerosis of carotid siphons. Skull: Normal. Negative for fracture or focal lesion. Sinuses/Orbits: Small fluid level in the right posterior ethmoid air cells and the sphenoid sinus likely due to intubation. Right mastoid effusion. Other: None. IMPRESSION: No acute intracranial abnormality identified. Stable negative CT of the head. Electronically Signed   By: Kristine Garbe M.D.   On: 01/14/2018 02:42   Ct Chest Wo Contrast  Result Date: 01/14/2018 CLINICAL DATA:  Acute respiratory illness. Interstitial lung disease. Acute renal failure secondary to severe hypotension. Metabolic acidosis. Acute respiratory failure. Generalized edema. EXAM: CT CHEST WITHOUT CONTRAST TECHNIQUE: Multidetector CT imaging of the chest was performed following the standard protocol without IV  contrast. COMPARISON:  01/03/2018 FINDINGS: Cardiovascular: Cardiac enlargement. No pericardial effusion.  Normal caliber thoracic aorta. Minimal aortic calcification. Right central venous catheter with tip in the low SVC. Mediastinum/Nodes: Enteric tube with tip in the distal stomach. Esophagus is decompressed. Diffusely enlarged mediastinal lymph nodes. AP window nodes measure up to 13 mm short axis dimension. Right paratracheal nodes measure up to 1.5 cm short axis dimension. Lymph nodes are similar in appearance to previous study. Lungs/Pleura: Motion artifact limits examination. There are small bilateral pleural effusions with consolidation and atelectasis in both lung bases. This appearance is similar to previous study. There appears to be developing airspace infiltration throughout the right lung since previous study. This could represent increasing edema or multifocal pneumonia. No pneumothorax. Airways are patent. Upper Abdomen: Surgical absence of the gallbladder. Musculoskeletal: No chest wall mass or suspicious bone lesions identified. Degenerative changes in the thoracic spine. IMPRESSION: Cardiac enlargement. Small bilateral pleural effusions with consolidation and atelectasis in both lung bases. There appears to be developing airspace disease in the right lung since previous study. This could represent increasing edema or multifocal pneumonia. Mediastinal lymphadenopathy is similar to previous study and likely reactive. Aortic Atherosclerosis (ICD10-I70.0). Electronically Signed   By: Lucienne Capers M.D.   On: 01/14/2018 02:50   Dg Chest Port 1 View  Result Date: 01/15/2018 CLINICAL DATA:  Acute respiratory failure EXAM: PORTABLE CHEST 1 VIEW COMPARISON:  Chest 01/13/2018 FINDINGS: Endotracheal tube with tip measuring 3.4 cm above the carina. Enteric tube tip is off the field of view but below the left hemidiaphragm. Right central venous catheter with tip over the low SVC region. Cardiac enlargement. Bilateral pleural effusions with basilar atelectasis. IMPRESSION: Appliances appear in satisfactory  position. Cardiac enlargement. Bilateral pleural effusions with basilar atelectasis. Electronically Signed   By: Lucienne Capers M.D.   On: 01/15/2018 04:41   Dg Chest Port 1 View  Result Date: 01/13/2018 CLINICAL DATA:  52 year old male with sepsis and respiratory failure. Acute renal failure. Intubated. EXAM: PORTABLE CHEST 1 VIEW COMPARISON:  01/12/2018 and earlier. FINDINGS: Portable AP semi upright view at 0756 hours. Stable endotracheal tube tip at the level the clavicles. Stable right IJ central line. Enteric tube courses to the left upper quadrant, tip not included. Stable lung volumes. No pneumothorax. Stable pulmonary vascularity without overt edema. Dense retrocardiac and veiling bibasilar opacity is stable. Paucity of bowel gas in the upper abdomen. IMPRESSION: 1.  Stable lines and tubes. 2. Stable ventilation with bilateral lower lobe collapse or consolidation and probable small pleural effusions. Electronically Signed   By: Genevie Ann M.D.   On: 01/13/2018 09:13     Medications:   . sodium chloride Stopped (01/11/18 1517)  . amiodarone 60 mg/hr (01/15/18 0800)  . anidulafungin    . anticoagulant sodium citrate    . fentaNYL infusion INTRAVENOUS 350 mcg/hr (01/15/18 0454)  . heparin 1,050 Units/hr (01/15/18 0206)  . linezolid (ZYVOX) IV Stopped (01/14/18 2239)  . piperacillin-tazobactam (ZOSYN)  IV 3.375 g (01/15/18 0500)  . propofol (DIPRIVAN) infusion Stopped (01/14/18 0810)   . chlorhexidine gluconate (MEDLINE KIT)  15 mL Mouth Rinse BID  . Chlorhexidine Gluconate Cloth  6 each Topical Q0600  . diltiazem  60 mg Oral Q6H  . sennosides  5 mL Per Tube BID   And  . docusate  100 mg Per Tube BID  . famotidine  20 mg Per Tube BID  . feeding supplement (PRO-STAT SUGAR FREE 64)  60 mL Per Tube 5 X Daily  .  feeding supplement (VITAL HIGH PROTEIN)  1,000 mL Per Tube Q24H  . fentaNYL (SUBLIMAZE) injection  50 mcg Intravenous Once  . free water  250 mL Per Tube Q4H  . insulin aspart   0-15 Units Subcutaneous Q4H  . insulin aspart  5 Units Subcutaneous Q4H  . ipratropium-albuterol  3 mL Nebulization Q6H  . mouth rinse  15 mL Mouth Rinse 10 times per day  . multivitamin  15 mL Per Tube Daily  . nutrition supplement (JUVEN)  1 packet Per Tube BID BM  . polyvinyl alcohol  1 drop Both Eyes TID  . sodium chloride flush  10-40 mL Intracatheter Q12H  . sodium chloride flush  3 mL Intravenous Q12H  . thiamine  100 mg Per Tube Daily   sodium chloride, acetaminophen **OR** acetaminophen, anticoagulant sodium citrate, bisacodyl, fentaNYL, iopamidol, ipratropium-albuterol, labetalol, midazolam, midazolam, neomycin-bacitracin-polymyxin, sodium chloride flush, sodium chloride flush, vecuronium  Assessment/ Plan:  52 y.o. male with diabetes mellitus type 2, hypertension, chronic systolic heart failure ejection fraction 45%, who was admitted to [facility] on[facility]2019for evaluation of abdominal discomfort and bloating. Patient had decompensation on January 03, 2018 with severe hypotension requiring multiple pressors, acute respiratory failure, shock liver and acute renal failure. Started CRRT from 6/22 to 6/28. Intermittent hemodialysis on 6/28. Trial of furosemide with good results on 6/29.   1. Acute renal failure secondary to severe hypotension,and also exposed to iv contrast with CTA. Baseline creatinine 1.14, 12/12/16 2. Generalized edema 3.  Acute respiratory failure.     Remains critically ill.  Urine output is good.  Serum creatinine and BUN are worse suggesting intravascular depletion vs developing sepsis as WBC counts elevated and patient now has fevers Will d/c Lasix for now Maintain hemodynamic support and avoid hypotension Electrolytes and Volume status are acceptable No acute indication for Dialysis at present Will follow     LOS: [place] 7/4/20198:07 AM  [place], [place]  Note: This note was prepared with  Dragon dictation. Any transcription errors are unintentional

## 2018-01-15 NOTE — Progress Notes (Signed)
[employer] [facility] [place] [facility]   PATIENT NAME: Ryan Webb    MR#:  [id number]  DATE OF BIRTH:  03/25/1966  SUBJECTIVE:   Patient remains critically ill.  Patient had 2600 normal urine output.   on vent, febrile, tachycardic heart rate around 110s  REVIEW OF SYSTEMS:    unAble to obtain  tolerating Diet: Tube feeds DRUG ALLERGIES:   Allergies  Allergen Reactions  . No Known Allergies     VITALS:  Blood pressure 110/80, pulse (!) 117, temperature (!) 100.4 F (38 C), temperature source Esophageal, resp. rate (!) 25, height 5\' 9"  (1.753 m), weight (!) 145.1 kg (319 lb 14.2 oz), SpO2 92 %.  PHYSICAL EXAMINATION:  Constitutional: Appears obese sedated on vent, critically ill HENT: Normocephalic.  Intubated Eyes: , no scleral icterus.  Neck: Normal ROM. Neck supple. No JVD. No tracheal deviation. CVS: Tachycardic S1/S2 +, no murmurs, no gallops, no carotid bruit.  Pulmonary: Effort and breath sounds normal, no stridor, rhonchi, wheezes, rales.  Abdominal: Soft. BS +,  no distension, tenderness, rebound or guarding.  Musculoskeletal: sedated Neuro:sedated     . Skin: Skin is warm and dry. No rash noted.  2+ lower extremity edema Psychiatric: sedated  LABORATORY PANEL:   CBC Recent Labs  Lab 01/15/18 0440  WBC 15.3*  HGB 10.3*  HCT 32.7*  PLT 300   ------------------------------------------------------------------------------------------------------------------  Chemistries  Recent Labs  Lab 01/15/18 0440  NA 148*  K 4.1  CL 110  CO2 26  GLUCOSE 257*  BUN 139*  CREATININE 2.43*  CALCIUM 8.1*  MG 2.5*   ------------------------------------------------------------------------------------------------------------------  Cardiac Enzymes No results for input(s): TROPONINI in the last 168 hours. ------------------------------------------------------------------------------------------------------------------  RADIOLOGY:  Dg Abd 1  View  Result Date: 01/15/2018 CLINICAL DATA:  Vomiting. EXAM: ABDOMEN - 1 VIEW COMPARISON:  01/12/2018 FINDINGS: Artifact from pad limits evaluation. Abdomen is incompletely included within the field of view. There is scattered gas in the colon without abnormal small or large bowel distention. An enteric tube is present in the upper mid abdomen consistent with location in the body of the stomach. Tubing projected over the right femoral region may represent a femoral venous catheter. Degenerative changes in the spine. IMPRESSION: Nonobstructive bowel gas pattern. Enteric tube tip projects over the body of the stomach. Electronically Signed   By: Burman NievesWilliam  Stevens M.D.   On: 01/15/2018 04:42   Ct Head Wo Contrast  Result Date: 01/14/2018 CLINICAL DATA:  52 y/o  M; altered level of consciousness. EXAM: CT HEAD WITHOUT CONTRAST TECHNIQUE: Contiguous axial images were obtained from the base of the skull through the vertex without intravenous contrast. COMPARISON:  01/03/2018 CT head. FINDINGS: Brain: No evidence of acute infarction, hemorrhage, hydrocephalus, extra-axial collection or mass lesion/mass effect. Few stable nonspecific foci of hypoattenuation within subcortical white matter are compatible with mild chronic microvascular ischemic changes and there is mild brain parenchymal volume loss. Vascular: Mild calcific atherosclerosis of carotid siphons. Skull: Normal. Negative for fracture or focal lesion. Sinuses/Orbits: Small fluid level in the right posterior ethmoid air cells and the sphenoid sinus likely due to intubation. Right mastoid effusion. Other: None. IMPRESSION: No acute intracranial abnormality identified. Stable negative CT of the head. Electronically Signed   By: Mitzi HansenLance  Furusawa-Stratton M.D.   On: 01/14/2018 02:42   Ct Chest Wo Contrast  Result Date: 01/14/2018 CLINICAL DATA:  Acute respiratory illness. Interstitial lung disease. Acute renal failure secondary to severe hypotension. Metabolic  acidosis. Acute respiratory failure. Generalized edema.  EXAM: CT CHEST WITHOUT CONTRAST TECHNIQUE: Multidetector CT imaging of the chest was performed following the standard protocol without IV contrast. COMPARISON:  01/03/2018 FINDINGS: Cardiovascular: Cardiac enlargement. No pericardial effusion. Normal caliber thoracic aorta. Minimal aortic calcification. Right central venous catheter with tip in the low SVC. Mediastinum/Nodes: Enteric tube with tip in the distal stomach. Esophagus is decompressed. Diffusely enlarged mediastinal lymph nodes. AP window nodes measure up to 13 mm short axis dimension. Right paratracheal nodes measure up to 1.5 cm short axis dimension. Lymph nodes are similar in appearance to previous study. Lungs/Pleura: Motion artifact limits examination. There are small bilateral pleural effusions with consolidation and atelectasis in both lung bases. This appearance is similar to previous study. There appears to be developing airspace infiltration throughout the right lung since previous study. This could represent increasing edema or multifocal pneumonia. No pneumothorax. Airways are patent. Upper Abdomen: Surgical absence of the gallbladder. Musculoskeletal: No chest wall mass or suspicious bone lesions identified. Degenerative changes in the thoracic spine. IMPRESSION: Cardiac enlargement. Small bilateral pleural effusions with consolidation and atelectasis in both lung bases. There appears to be developing airspace disease in the right lung since previous study. This could represent increasing edema or multifocal pneumonia. Mediastinal lymphadenopathy is similar to previous study and likely reactive. Aortic Atherosclerosis (ICD10-I70.0). Electronically Signed   By: Burman Nieves M.D.   On: 01/14/2018 02:50   Dg Chest Port 1 View  Result Date: 01/15/2018 CLINICAL DATA:  Acute respiratory failure EXAM: PORTABLE CHEST 1 VIEW COMPARISON:  Chest 01/13/2018 FINDINGS: Endotracheal tube with  tip measuring 3.4 cm above the carina. Enteric tube tip is off the field of view but below the left hemidiaphragm. Right central venous catheter with tip over the low SVC region. Cardiac enlargement. Bilateral pleural effusions with basilar atelectasis. IMPRESSION: Appliances appear in satisfactory position. Cardiac enlargement. Bilateral pleural effusions with basilar atelectasis. Electronically Signed   By: Burman Nieves M.D.   On: 01/15/2018 04:41     ASSESSMENT AND PLAN:   52 year old male with obesity, diabetes, hypertension came in with tachyarrhythmia and was noted to be in septic shock  * Septic shock with multiorgan failure.- PNA -Continue vent management as per ICU -Sputum culture obtained from tracheal aspirate is positive for gram-positive cocci in clusters and gram-negative coccobacilli 6/22 -IV zyvox, zosyn and eraxis empirically.  Follow-up on the cultures 6/29 -pt is s/o bronchoscopy with sputum cx pending -Previous blood cultures obtained on 01/03/2018 are negative.  Urine culture obtained on 01/03/2018 is also negative -MRSA PCR is negative   * Hypernatremia with hypokalemia D5W replete potassium  * Ectopic atrial tachycardia versus atrial flutter-remains tachycardic CT Angio of the chest and pelvis negative for any PE or aortic dissection. Echocardiogram with LV ejection fraction of 45% which is stable from prior. Platelet  count is better and patient is started on heparin drip.  Monitor platelet count closely Amiodarone drip -IV amiodarone gtt per cardiolgoy  * Acute renal failure with metabolic acidosis due to ATN from septic shock and contrast exposure due to CT scans.  Baseline creatinine 1.14 Patient was taking metformin and TCA as an outpatient, had hemodialysis  6/28.  CRRT discontinued Management per nephrology  *  Thrombocytopenia due to sepsis: Platelet count is increasing. Hit panel negative Last Platelets at 184,000  *Acute respiratory failure: Due  to sepsis and multiorgan failure Remains on ventilator and management per ICU team  * Elevated LFTs due to shock liver with multiorgan failure due to sepsis.  LFTs are  improving GI consultation appreciated  *  Nutrition: Continue tube feeds- vital Overall prognosis guarded.  Patient is critically ill.  Poor prognosis with high mortality and morbidity   CODE STATUS: FULL  TOTAL TIME TAKING CARE OF THIS PATIENT: 25 minutes.     POSSIBLE D/C ??, DEPENDING ON CLINICAL CONDITION.   Enedina Finner M.D on 01/15/2018 at 1:00 PM  Between 7am to 6pm - Pager - [phone] After 6pm go to [email or web address] - password [email or web address]  [facility]  Office  [phone]  CC: Primary care physician; Anola Gurney, PA  Note: This dictation was prepared with Dragon dictation along with smaller phrase technology. Any transcriptional errors that result from this process are unintentional.

## 2018-01-15 NOTE — Consult Note (Signed)
Pharmacy Antibiotic Note  Ryan Webb is a 52 y.o. male admitted on 12/18/2017 with pneumonia and sepsis.  Pharmacy has been consulted for vancomycin/zosyn dosing. Patient with renal failure, was on CRRT now started on intermittent HD.   Plan: No acute indication for HD at present per nephrology. Urine output is good. Lasix has been held.   Will continue Zosyn 3.375 g 8 hours for now as patient does not need HD currently - please follow closely and adjust for renal function.   Vancomycin discontinued - now on linezolid. Monitor CBC per protocol.   Patient continues on Eraxis.   Height: 5\' 9"  (175.3 cm) Weight: (!) 319 lb 14.2 oz (145.1 kg) IBW/kg (Calculated) : 70.7  Temp (24hrs), Avg:100 F (37.8 C), Min:99.1 F (37.3 C), Max:100.4 F (38 C)  Recent Labs  Lab 01/08/18 1550  01/10/18 0228 01/11/18 0428 01/12/18 0535 01/13/18 0350 01/13/18 0942 01/14/18 0205 01/14/18 0430 01/15/18 0440  WBC  --    < > 15.3* 9.7  --  7.5  --  9.9  --  15.3*  CREATININE 1.23   < > 1.75* 2.49* 2.24* 1.99* 1.85*  --  2.30* 2.43*  LATICACIDVEN 1.4  --   --   --   --   --   --   --   --   --   VANCORANDOM  --   --   --   --   --  5  --   --  11  --    < > = values in this interval not displayed.    Estimated Creatinine Clearance: 51.1 mL/min (A) (by C-G formula based on SCr of 2.43 mg/dL (H)).    Allergies  Allergen Reactions  . No Known Allergies     Antimicrobials this admission: zosyn 6/23 >>  vancomycin 6/29 >> 7/3 linezolid 7/3 >> anidulafungin 7/3 >>  Dose adjustments this admission:   Microbiology results: 7/3 TA: pending  7/2 TA: pending 7/2 UCx: NG 7/1 BCx: NGTD 6/29 BCx: NGTD 6/28 TA: moderate Candida albicans 6/22 BCx: NG 5 days 6/22 UCx: NG  6/22 Sputum: normal respiratory flora  6/22 MRSA PCR: neg  Thank you for allowing pharmacy to be a part of this patient's care.  Ryan Webb, PharmD Clinical Pharmacist  01/15/2018

## 2018-01-15 NOTE — Progress Notes (Signed)
[facility] Cardiology  SUBJECTIVE: Patient remains intubated on ventilator   Vitals:   01/15/18 0400 01/15/18 0438 01/15/18 0500 01/15/18 0600  BP: 124/87  116/88 118/87  Pulse: (!) 122  (!) 121   Resp: (!) 32  (!) 25 (!) 25  Temp: (!) 100.4 F (38 C)     TempSrc: Esophageal     SpO2: 92%  91%   Weight:  (!) 145.1 kg (319 lb 14.2 oz)    Height:         Intake/Output Summary (Last 24 hours) at 01/15/2018 0736 Last data filed at 01/15/2018 0600 Gross per 24 hour  Intake 1311.86 ml  Output 2690 ml  Net -1378.14 ml      PHYSICAL EXAM  General: Critically ill HEENT Intubated Neck:  No JVD.  Lungs: Clear bilaterally to auscultation and percussion. Heart: HRRR . Normal S1 and S2 without gallops or murmurs.  Abdomen: Bowel sounds are positive, abdomen soft and non-tender  Msk:  Back normal, normal gait. Normal strength and tone for age. Extremities: No clubbing, cyanosis or edema.   Neuro: Sedated Psych: Sedated   LABS: Basic Metabolic Panel: Recent Labs    01/14/18 0205 01/14/18 0430 01/15/18 0440  NA  --  148* 148*  K  --  3.8 4.1  CL  --  112* 110  CO2  --  24 26  GLUCOSE  --  215* 257*  BUN  --  119* 139*  CREATININE  --  2.30* 2.43*  CALCIUM  --  8.0* 8.1*  MG 2.4  --  2.5*  PHOS  --  6.1* 5.2*   Liver Function Tests: Recent Labs    01/14/18 0430 01/15/18 0440  ALBUMIN 2.9* 3.0*   No results for input(s): LIPASE, AMYLASE in the last 72 hours. CBC: Recent Labs    01/14/18 0205 01/15/18 0440  WBC 9.9 15.3*  HGB 10.3* 10.3*  HCT 32.2* 32.7*  MCV 103.7* 104.6*  PLT 278 300   Cardiac Enzymes: Recent Labs    01/14/18 1042  CKTOTAL 55   BNP: Invalid input(s): POCBNP D-Dimer: No results for input(s): DDIMER in the last 72 hours. Hemoglobin A1C: No results for input(s): HGBA1C in the last 72 hours. Fasting Lipid Panel: Recent Labs    01/12/18 1413  TRIG 105   Thyroid Function Tests: No results for input(s): TSH, T4TOTAL, T3FREE, THYROIDAB in  the last 72 hours.  Invalid input(s): FREET3 Anemia Panel: No results for input(s): VITAMINB12, FOLATE, FERRITIN, TIBC, IRON, RETICCTPCT in the last 72 hours.  Dg Abd 1 View  Result Date: 01/15/2018 CLINICAL DATA:  Vomiting. EXAM: ABDOMEN - 1 VIEW COMPARISON:  01/12/2018 FINDINGS: Artifact from pad limits evaluation. Abdomen is incompletely included within the field of view. There is scattered gas in the colon without abnormal small or large bowel distention. An enteric tube is present in the upper mid abdomen consistent with location in the body of the stomach. Tubing projected over the right femoral region may represent a femoral venous catheter. Degenerative changes in the spine. IMPRESSION: Nonobstructive bowel gas pattern. Enteric tube tip projects over the body of the stomach. Electronically Signed   By: Burman Nieves M.D.   On: 01/15/2018 04:42   Ct Head Wo Contrast  Result Date: 01/14/2018 CLINICAL DATA:  52 y/o  M; altered level of consciousness. EXAM: CT HEAD WITHOUT CONTRAST TECHNIQUE: Contiguous axial images were obtained from the base of the skull through the vertex without intravenous contrast. COMPARISON:  01/03/2018 CT head. FINDINGS:  Brain: No evidence of acute infarction, hemorrhage, hydrocephalus, extra-axial collection or mass lesion/mass effect. Few stable nonspecific foci of hypoattenuation within subcortical white matter are compatible with mild chronic microvascular ischemic changes and there is mild brain parenchymal volume loss. Vascular: Mild calcific atherosclerosis of carotid siphons. Skull: Normal. Negative for fracture or focal lesion. Sinuses/Orbits: Small fluid level in the right posterior ethmoid air cells and the sphenoid sinus likely due to intubation. Right mastoid effusion. Other: None. IMPRESSION: No acute intracranial abnormality identified. Stable negative CT of the head. Electronically Signed   By: Mitzi HansenLance  Furusawa-Stratton M.D.   On: 01/14/2018 02:42   Ct  Chest Wo Contrast  Result Date: 01/14/2018 CLINICAL DATA:  Acute respiratory illness. Interstitial lung disease. Acute renal failure secondary to severe hypotension. Metabolic acidosis. Acute respiratory failure. Generalized edema. EXAM: CT CHEST WITHOUT CONTRAST TECHNIQUE: Multidetector CT imaging of the chest was performed following the standard protocol without IV contrast. COMPARISON:  01/03/2018 FINDINGS: Cardiovascular: Cardiac enlargement. No pericardial effusion. Normal caliber thoracic aorta. Minimal aortic calcification. Right central venous catheter with tip in the low SVC. Mediastinum/Nodes: Enteric tube with tip in the distal stomach. Esophagus is decompressed. Diffusely enlarged mediastinal lymph nodes. AP window nodes measure up to 13 mm short axis dimension. Right paratracheal nodes measure up to 1.5 cm short axis dimension. Lymph nodes are similar in appearance to previous study. Lungs/Pleura: Motion artifact limits examination. There are small bilateral pleural effusions with consolidation and atelectasis in both lung bases. This appearance is similar to previous study. There appears to be developing airspace infiltration throughout the right lung since previous study. This could represent increasing edema or multifocal pneumonia. No pneumothorax. Airways are patent. Upper Abdomen: Surgical absence of the gallbladder. Musculoskeletal: No chest wall mass or suspicious bone lesions identified. Degenerative changes in the thoracic spine. IMPRESSION: Cardiac enlargement. Small bilateral pleural effusions with consolidation and atelectasis in both lung bases. There appears to be developing airspace disease in the right lung since previous study. This could represent increasing edema or multifocal pneumonia. Mediastinal lymphadenopathy is similar to previous study and likely reactive. Aortic Atherosclerosis (ICD10-I70.0). Electronically Signed   By: Burman NievesWilliam  Stevens M.D.   On: 01/14/2018 02:50   Dg  Chest Port 1 View  Result Date: 01/15/2018 CLINICAL DATA:  Acute respiratory failure EXAM: PORTABLE CHEST 1 VIEW COMPARISON:  Chest 01/13/2018 FINDINGS: Endotracheal tube with tip measuring 3.4 cm above the carina. Enteric tube tip is off the field of view but below the left hemidiaphragm. Right central venous catheter with tip over the low SVC region. Cardiac enlargement. Bilateral pleural effusions with basilar atelectasis. IMPRESSION: Appliances appear in satisfactory position. Cardiac enlargement. Bilateral pleural effusions with basilar atelectasis. Electronically Signed   By: Burman NievesWilliam  Stevens M.D.   On: 01/15/2018 04:41   Dg Chest Port 1 View  Result Date: 01/13/2018 CLINICAL DATA:  52 year old male with sepsis and respiratory failure. Acute renal failure. Intubated. EXAM: PORTABLE CHEST 1 VIEW COMPARISON:  01/12/2018 and earlier. FINDINGS: Portable AP semi upright view at 0756 hours. Stable endotracheal tube tip at the level the clavicles. Stable right IJ central line. Enteric tube courses to the left upper quadrant, tip not included. Stable lung volumes. No pneumothorax. Stable pulmonary vascularity without overt edema. Dense retrocardiac and veiling bibasilar opacity is stable. Paucity of bowel gas in the upper abdomen. IMPRESSION: 1.  Stable lines and tubes. 2. Stable ventilation with bilateral lower lobe collapse or consolidation and probable small pleural effusions. Electronically Signed   By: Odessa FlemingH  Hall  M.D.   On: 01/13/2018 09:13     Echo LVEF 45 to 50%  TELEMETRY: Atrial flutter 110 to 120 bpm:  ASSESSMENT AND PLAN:  Active Problems:   Tachyarrhythmia   Respiratory failure (HCC)   Shock (HCC)   Acute respiratory failure (HCC)   Transaminitis   Pressure injury of skin    1.  Tachycardia, probable atrial flutter with right bundle branch block and 2-1 conduction, rate and rhythm refractory to conventional therapy, due to underlying comorbidities with continuing sepsis picture,  respiratory failure, febrile episodes, and multiorgan failure. 2.  Known CAD, chronically occluded RCA by previous cardiac catheterization, borderline elevated troponin likely due to demand supply ischemia without acute ischemic ECG changes 3.  Mild cardiomyopathy, LVEF 45 to 50% 4.  Ongoing sepsis, respiratory failure, multiorgan failure  Recommendations  1.  Continue amiodarone drip, transition to 400 mg p.o. twice daily prior to discharge 2.  Continue diltiazem 60 mg every 6 hours, uptitrate as needed 3.  Defer starting beta-blocker with history of intolerance 4.  Transition heparin drip to Eliquis 5 mg twice daily prior to discharge 5.  May consider adding digoxin, though hesitant in light of labile renal function  Sign off for now, please call if any questions   Marcina Millard, MD, PhD, [facility] 01/15/2018 7:36 AM

## 2018-01-15 NOTE — Progress Notes (Signed)
ANTICOAGULATION CONSULT NOTE - Follow Up Consult  Pharmacy Consult for Heparin Drip Indication: atrial fibrillation  Allergies  Allergen Reactions  . No Known Allergies     Patient Measurements: Height: 5\' 9"  (175.3 cm) Weight: (!) 319 lb 14.2 oz (145.1 kg) IBW/kg (Calculated) : 70.7 Heparin Dosing Weight: 98.9 kg  Vital Signs: Temp: 100.4 F (38 C) (07/04 0400) Temp Source: Esophageal (07/04 0400) BP: 118/87 (07/04 0600) Pulse Rate: 121 (07/04 0500)  Labs: Recent Labs    01/13/18 0350 01/13/18 0942  01/14/18 0205 01/14/18 0430 01/14/18 0816 01/14/18 1042 01/14/18 1616 01/15/18 0440  HGB 9.0*  --   --  10.3*  --   --   --   --  10.3*  HCT 28.0*  --   --  32.2*  --   --   --   --  32.7*  PLT 184  --   --  278  --   --   --   --  300  HEPARINUNFRC 0.98*  --    < > 0.84*  --  0.70  --  0.71* 0.58  CREATININE 1.99* 1.85*  --   --  2.30*  --   --   --  2.43*  CKTOTAL  --   --   --   --   --   --  55  --   --    < > = values in this interval not displayed.    Estimated Creatinine Clearance: 51.1 mL/min (A) (by C-G formula based on SCr of 2.43 mg/dL (H)).  52 y/o M admitted with septic shock on heparin drip for atrial flutter.   Goal of Therapy:  Heparin level 0.3-0.7 units/ml Monitor platelets by anticoagulation protocol: Yes   Assessment/Plan:  07/04 @ 0500 HL 0.58 therapeutic. Will continue at 1050 units/hr and will recheck @ 1100.  Thomasene Rippleavid Akeelah Seppala, PharmD, BCPS Clinical Pharmacist /01/15/2018

## 2018-01-15 NOTE — Consult Note (Signed)
Reason for Consult: mental status change  Referring Physician: Dr. Lonn Georgia   CC: mental status change.   HPI: Ryan Webb is an 52 y.o. male with past medical history of hypertension, diabetes, tachycardia,admitted initially on 6/21 for abdominal pain, noted to have severe acidosis, possibly related to metformin use,respiratory failure, renal failure, now with prolonged mechanical ventilation.     Past Medical History:  Diagnosis Date  . Diabetes mellitus without complication (HCC)   . Hypertension     Past Surgical History:  Procedure Laterality Date  . CHOLECYSTECTOMY  2000    Family History  Problem Relation Age of Onset  . COPD Father   . Diabetes Father   . Diabetes Mother     Social History:  reports that he has never smoked. He has never used smokeless tobacco. He reports that he does not drink alcohol or use drugs.  Allergies  Allergen Reactions  . No Known Allergies     Medications: I have reviewed the patient's current medications.  ROS: Intubated unable to obtain   Physical Examination: Blood pressure 110/80, pulse (!) 117, temperature (!) 100.4 F (38 C), temperature source Esophageal, resp. rate (!) 25, height 5\' 9"  (1.753 m), weight (!) 319 lb 14.2 oz (145.1 kg), SpO2 92 %.  Opens his eyes spontaneously  Does not track No withdrawal from painful stimuli  Laboratory Studies:   Basic Metabolic Panel: Recent Labs  Lab 01/11/18 0428 01/12/18 0535 01/13/18 0350 01/13/18 0942 01/14/18 0205 01/14/18 0430 01/15/18 0440  NA 140 144 153* 154*  --  148* 148*  K 3.2* 3.6 2.3* 2.9*  --  3.8 4.1  CL 107 105 112* 115*  --  112* 110  CO2 25 29 32 28  --  24 26  GLUCOSE 131* 213* 162* 219*  --  215* 257*  BUN 87* 98* 91* 90*  --  119* 139*  CREATININE 2.49* 2.24* 1.99* 1.85*  --  2.30* 2.43*  CALCIUM 8.0* 8.2* 8.2* 8.2*  --  8.0* 8.1*  MG 2.4 2.2 2.4  --  2.4  --  2.5*  PHOS 5.3* 6.2* 3.6  --   --  6.1* 5.2*    Liver Function Tests: Recent Labs   Lab 01/08/18 1223  01/11/18 0428 01/12/18 0535 01/13/18 0350 01/14/18 0430 01/15/18 0440  AST 134*  --   --   --   --   --   --   ALT 833*  --   --   --   --   --   --   ALKPHOS 102  --   --   --   --   --   --   BILITOT 1.8*  --   --   --   --   --   --   PROT 6.8  --   --   --   --   --   --   ALBUMIN 4.1  4.2   < > 3.0* 3.5 2.9* 2.9* 3.0*   < > = values in this interval not displayed.   No results for input(s): LIPASE, AMYLASE in the last 168 hours. No results for input(s): AMMONIA in the last 168 hours.  CBC: Recent Labs  Lab 01/09/18 0945 01/10/18 0228 01/11/18 0428 01/13/18 0350 01/14/18 0205 01/15/18 0440  WBC 13.4* 15.3* 9.7 7.5 9.9 15.3*  NEUTROABS 10.5*  --   --   --   --   --   HGB 10.1* 9.9* 8.9* 9.0*  10.3* 10.3*  HCT 31.9* 31.8* 26.9* 28.0* 32.2* 32.7*  MCV 104.6* 103.8* 102.7* 102.9* 103.7* 104.6*  PLT 76* 116* 124* 184 278 300    Cardiac Enzymes: Recent Labs  Lab 01/14/18 1042  CKTOTAL 55    BNP: Invalid input(s): POCBNP  CBG: Recent Labs  Lab 01/14/18 1537 01/14/18 1944 01/14/18 2340 01/15/18 0354 01/15/18 0722  GLUCAP 257* 230* 232* 232* 227*    Microbiology: Results for orders placed or performed during the hospital encounter of 03-30-18  Culture, blood (routine x 2)     Status: None   Collection Time: 01/03/18 10:42 AM  Result Value Ref Range Status   Specimen Description [facility]  Final   Special Requests   Final    BOTTLES DRAWN AEROBIC AND ANAEROBIC Blood Culture adequate volume   Culture   Final    NO GROWTH 5 DAYS Performed at [facility], [address] [place], [place] [address]    Report Status 01/08/2018 FINAL  Final  MRSA PCR Screening     Status: None   Collection Time: 01/03/18 10:56 AM  Result Value Ref Range Status   MRSA by PCR NEGATIVE NEGATIVE Final    Comment:        The GeneXpert MRSA Assay (FDA approved for NASAL specimens only), is one component of a comprehensive MRSA  colonization surveillance program. It is not intended to diagnose MRSA infection nor to guide or monitor treatment for MRSA infections. Performed at [facility], [address] [place], [place] [address]   CULTURE, BLOOD (ROUTINE X 2) w Reflex to ID Panel     Status: None   Collection Time: 01/03/18  1:25 PM  Result Value Ref Range Status   Specimen Description BLOOD LINE  Final   Special Requests   Final    BOTTLES DRAWN AEROBIC AND ANAEROBIC Blood Culture adequate volume   Culture   Final    NO GROWTH 5 DAYS Performed at [facility], [address] [place], [place] [address]    Report Status 01/08/2018 FINAL  Final  Culture, Urine     Status: None   Collection Time: 01/03/18  4:49 PM  Result Value Ref Range Status   Specimen Description   Final    URINE, RANDOM Performed at [facility], [address] [place], [place] [address]    Special Requests   Final    NONE Performed at [facility], [address] [place], [place] [address]    Culture   Final    NO GROWTH Performed at [facility], [address] [place], [place] [address]    Report Status 01/04/2018 FINAL  Final  Culture, expectorated sputum-assessment     Status: None   Collection Time: 01/03/18  5:53 PM  Result Value Ref Range Status   Specimen Description TRACHEAL ASPIRATE  Final   Special Requests NONE  Final   Sputum evaluation   Final    THIS SPECIMEN IS ACCEPTABLE FOR SPUTUM CULTURE Performed at [facility], [address] [place], [place] [address]    Report Status 01/03/2018 FINAL  Final  Culture, respiratory (NON-Expectorated)     Status: None   Collection Time: 01/03/18  5:53 PM  Result Value Ref Range Status   Specimen Description   Final    TRACHEAL ASPIRATE Performed at [facility], [address] [place], [place] [address]    Special Requests   Final    NONE  Reflexed from [id number] Performed at [facility], [address] [place], [place] [address]    Gram Stain   Final    FEW WBC PRESENT, PREDOMINANTLY PMN RARE SQUAMOUS EPITHELIAL CELLS PRESENT FEW GRAM POSITIVE COCCI IN CLUSTERS FEW GRAM NEGATIVE COCCOBACILLI    Culture   Final    Consistent with normal respiratory flora. Performed at [facility], [address] [place], [place] [address]    Report Status 01/06/2018 FINAL  Final  CULTURE, BLOOD (ROUTINE X 2) w Reflex to ID Panel     Status: None   Collection Time: 01/10/18  9:35 AM  Result Value Ref Range Status   Specimen Description BLOOD L WRIST  Final   Special Requests   Final    BOTTLES DRAWN AEROBIC AND ANAEROBIC Blood Culture adequate volume   Culture   Final    NO GROWTH 5 DAYS Performed at [facility], [address] [place], [place] [address]    Report Status 01/15/2018 FINAL  Final  CULTURE, BLOOD (ROUTINE X 2) w Reflex to ID Panel     Status: None   Collection Time: 01/10/18  9:47 AM  Result Value Ref Range Status   Specimen Description BLOOD R FOREARM  Final   Special Requests   Final    BOTTLES DRAWN AEROBIC AND ANAEROBIC Blood Culture adequate volume   Culture   Final    NO GROWTH 5 DAYS Performed at [facility], [address] [place], [place] [address]    Report Status 01/15/2018 FINAL  Final  Culture, respiratory (NON-Expectorated)     Status: None   Collection Time: 01/10/18 12:03 PM  Result Value Ref Range Status   Specimen Description   Final    TRACHEAL ASPIRATE Performed at [facility], [address], [place], [place] [address]    Special Requests   Final    NONE Performed at [facility], [address] [place], [place] [address]    Gram Stain   Final    ABUNDANT WBC PRESENT,BOTH PMN AND MONONUCLEAR RARE SQUAMOUS EPITHELIAL CELLS PRESENT RARE BUDDING YEAST SEEN Performed at [facility], [address] [place], [place] [address]    Culture MODERATE CANDIDA ALBICANS  Final    Report Status 01/13/2018 FINAL  Final  Culture, blood (Routine X 2) w Reflex to ID Panel     Status: None (Preliminary result)   Collection Time: 01/12/18 10:06 PM  Result Value Ref Range Status   Specimen Description BLOOD RIGHT HAND  Final   Special Requests   Final    BOTTLES DRAWN AEROBIC AND ANAEROBIC Blood Culture adequate volume   Culture   Final    NO GROWTH 3 DAYS Performed at [facility], [address] [place], [place] [address]    Report Status PENDING  Incomplete  Culture, blood (Routine X 2) w Reflex to ID Panel     Status: None (Preliminary result)   Collection Time: 01/12/18 10:07 PM  Result Value Ref Range Status   Specimen Description BLOOD RIGHT ANTECUBITAL  Final   Special Requests   Final    BOTTLES DRAWN AEROBIC AND ANAEROBIC Blood Culture results may not be optimal due to an excessive volume of blood received in culture bottles   Culture   Final    NO GROWTH 3 DAYS Performed at [facility], [address] [place], [place] [address]    Report Status PENDING  Incomplete  Culture,  respiratory (NON-Expectorated)     Status: None   Collection Time: 01/13/18 12:28 AM  Result Value Ref Range Status   Specimen Description   Final    TRACHEAL ASPIRATE Performed at [facility], [address] [place], [place] [address]    Special Requests   Final    NONE Performed at [facility], [address] [place], [place] [address]    Gram Stain   Final    ABUNDANT WBC PRESENT, PREDOMINANTLY PMN NO ORGANISMS SEEN Performed at [facility], [address], [place], [place] [address]    Culture FEW CANDIDA ALBICANS  Final   Report Status 01/15/2018 FINAL  Final  Urine Culture     Status: None   Collection Time: 01/13/18  3:50 AM  Result Value Ref Range Status   Specimen Description   Final    URINE, RANDOM Performed at [facility], [address], [place], [place] [address]    Special Requests   Final     NONE Performed at [facility], [address], [place], [place] [address]    Culture   Final    NO GROWTH Performed at [facility], [address] [place], [place] [address]    Report Status 01/14/2018 FINAL  Final  Culture, respiratory (NON-Expectorated)     Status: None (Preliminary result)   Collection Time: 01/14/18 11:07 AM  Result Value Ref Range Status   Specimen Description   Final    TRACHEAL ASPIRATE Performed at [facility], [address] [place], [place] [address]    Special Requests   Final    NONE Performed at [facility], [address] [place], [place] [address]    Gram Stain   Final    RARE WBC PRESENT, PREDOMINANTLY PMN NO ORGANISMS SEEN    Culture   Final    CULTURE REINCUBATED FOR BETTER GROWTH Performed at [facility], [address], [place], [place] [address]    Report Status PENDING  Incomplete    Coagulation Studies: No results for input(s): LABPROT, INR in the last 72 hours.  Urinalysis:  Recent Labs  Lab 01/10/18 1016  COLORURINE AMBER*  LABSPEC 1.016  PHURINE 5.0  GLUCOSEU NEGATIVE  HGBUR NEGATIVE  BILIRUBINUR NEGATIVE  KETONESUR NEGATIVE  PROTEINUR 100*  NITRITE NEGATIVE  LEUKOCYTESUR NEGATIVE    Lipid Panel:     Component Value Date/Time   CHOL 220 (H) 09/26/2017 0823   TRIG 105 01/12/2018 1413   HDL 33 (L) 09/26/2017 0823   CHOLHDL 6.7 (H) 09/26/2017 0823   LDLCALC 130 (H) 09/26/2017 0823    HgbA1C:  Lab Results  Component Value Date   HGBA1C 6.5 09/18/2017    Urine Drug Screen:      Component Value Date/Time   LABOPIA NONE DETECTED 01/03/2018 1649   COCAINSCRNUR NONE DETECTED 01/03/2018 1649   LABBENZ POSITIVE (A) 01/03/2018 1649   AMPHETMU NONE DETECTED 01/03/2018 1649   THCU NONE DETECTED 01/03/2018 1649   LABBARB (A) 01/03/2018 1649    Result not available. Reagent lot number recalled by manufacturer.    Alcohol Level: No results for input(s): ETH in  the last 168 hours.   Imaging: Dg Abd 1 View  Result Date: 01/15/2018 CLINICAL DATA:  Vomiting. EXAM: ABDOMEN - 1 VIEW COMPARISON:  01/12/2018 FINDINGS: Artifact from pad limits evaluation. Abdomen is incompletely included within the field of view. There is scattered gas in the colon without abnormal small  or large bowel distention. An enteric tube is present in the upper mid abdomen consistent with location in the body of the stomach. Tubing projected over the right femoral region may represent a femoral venous catheter. Degenerative changes in the spine. IMPRESSION: Nonobstructive bowel gas pattern. Enteric tube tip projects over the body of the stomach. Electronically Signed   By: Burman Nieves M.D.   On: 01/15/2018 04:42   Ct Head Wo Contrast  Result Date: 01/14/2018 CLINICAL DATA:  52 y/o  M; altered level of consciousness. EXAM: CT HEAD WITHOUT CONTRAST TECHNIQUE: Contiguous axial images were obtained from the base of the skull through the vertex without intravenous contrast. COMPARISON:  01/03/2018 CT head. FINDINGS: Brain: No evidence of acute infarction, hemorrhage, hydrocephalus, extra-axial collection or mass lesion/mass effect. Few stable nonspecific foci of hypoattenuation within subcortical white matter are compatible with mild chronic microvascular ischemic changes and there is mild brain parenchymal volume loss. Vascular: Mild calcific atherosclerosis of carotid siphons. Skull: Normal. Negative for fracture or focal lesion. Sinuses/Orbits: Small fluid level in the right posterior ethmoid air cells and the sphenoid sinus likely due to intubation. Right mastoid effusion. Other: None. IMPRESSION: No acute intracranial abnormality identified. Stable negative CT of the head. Electronically Signed   By: Mitzi Hansen M.D.   On: 01/14/2018 02:42   Ct Chest Wo Contrast  Result Date: 01/14/2018 CLINICAL DATA:  Acute respiratory illness. Interstitial lung disease. Acute renal failure  secondary to severe hypotension. Metabolic acidosis. Acute respiratory failure. Generalized edema. EXAM: CT CHEST WITHOUT CONTRAST TECHNIQUE: Multidetector CT imaging of the chest was performed following the standard protocol without IV contrast. COMPARISON:  01/03/2018 FINDINGS: Cardiovascular: Cardiac enlargement. No pericardial effusion. Normal caliber thoracic aorta. Minimal aortic calcification. Right central venous catheter with tip in the low SVC. Mediastinum/Nodes: Enteric tube with tip in the distal stomach. Esophagus is decompressed. Diffusely enlarged mediastinal lymph nodes. AP window nodes measure up to 13 mm short axis dimension. Right paratracheal nodes measure up to 1.5 cm short axis dimension. Lymph nodes are similar in appearance to previous study. Lungs/Pleura: Motion artifact limits examination. There are small bilateral pleural effusions with consolidation and atelectasis in both lung bases. This appearance is similar to previous study. There appears to be developing airspace infiltration throughout the right lung since previous study. This could represent increasing edema or multifocal pneumonia. No pneumothorax. Airways are patent. Upper Abdomen: Surgical absence of the gallbladder. Musculoskeletal: No chest wall mass or suspicious bone lesions identified. Degenerative changes in the thoracic spine. IMPRESSION: Cardiac enlargement. Small bilateral pleural effusions with consolidation and atelectasis in both lung bases. There appears to be developing airspace disease in the right lung since previous study. This could represent increasing edema or multifocal pneumonia. Mediastinal lymphadenopathy is similar to previous study and likely reactive. Aortic Atherosclerosis (ICD10-I70.0). Electronically Signed   By: Burman Nieves M.D.   On: 01/14/2018 02:50   Dg Chest Port 1 View  Result Date: 01/15/2018 CLINICAL DATA:  Acute respiratory failure EXAM: PORTABLE CHEST 1 VIEW COMPARISON:  Chest  01/13/2018 FINDINGS: Endotracheal tube with tip measuring 3.4 cm above the carina. Enteric tube tip is off the field of view but below the left hemidiaphragm. Right central venous catheter with tip over the low SVC region. Cardiac enlargement. Bilateral pleural effusions with basilar atelectasis. IMPRESSION: Appliances appear in satisfactory position. Cardiac enlargement. Bilateral pleural effusions with basilar atelectasis. Electronically Signed   By: Burman Nieves M.D.   On: 01/15/2018 04:41     Assessment/Plan:  52 y.o. male with past medical history of hypertension, diabetes, tachycardia,admitted initially on 6/21 for abdominal pain, noted to have severe acidosis, possibly related to metformin use,respiratory failure, renal failure, now with prolonged mechanical ventilation.   Current symptoms are likely multifactorial and metabolic related in setting of renal failure, sepsis/pna Overall looking into the chart this is likely going to be a poor prognosis CTH reviewed I would consider getting MRI of brain due to periods of hypotension that could be causing watershed subcortical ischemia.   He is on 4 drips, not sure he can lay flat.  Would order tomorrow if possible.    Pauletta Browns  01/15/2018, 11:26 AM

## 2018-01-15 NOTE — Progress Notes (Signed)
Follow up - Critical Care Medicine Note  Patient Details:    Ryan Webb is an 52 y.o. male.with past medical history of hypertension, diabetes, tachycardia,admitted initially for abdominal pain, noted to have severe acidosis, possibly related to metformin use,respiratory failure, renal failure, now with prolonged mechanical ventilation  Lines, Airways, Drains: Airway (Active)  Secured at (cm) 23 cm 01/15/2018  4:00 AM  Measured From Lips 01/15/2018  4:00 AM  Secured Location Left 01/15/2018  4:00 AM  Secured By [employer] 01/15/2018  4:00 AM  Tube Holder Repositioned Yes 01/15/2018  3:46 AM  Cuff Pressure (cm H2O) 32 cm H2O 01/15/2018  3:46 AM  Site Condition Dry 01/15/2018  4:00 AM     CVC Triple Lumen 01/03/18 Right Internal jugular (Active)  Indication for Insertion or Continuance of Line Prolonged intravenous therapies 01/14/2018  8:00 PM  Site Assessment Clean;Dry;Intact 01/14/2018  8:00 PM  Proximal Lumen Status Infusing;Flushed;Blood return noted 01/14/2018  8:00 PM  Medial Lumen Status Infusing;Flushed;Blood return noted 01/14/2018  8:00 PM  Distal Lumen Status In-line blood sampling system in place;Blood return noted;Flushed 01/14/2018  8:00 PM  Dressing Type Transparent;Occlusive 01/14/2018  8:00 PM  Dressing Status Clean;Dry;Intact;Antimicrobial disc in place 01/14/2018  8:00 PM  Line Care Connections checked and tightened 01/14/2018  8:00 PM  Dressing Intervention New dressing;Antimicrobial disc changed;Securement device changed 01/15/2018  1:00 AM  Dressing Change Due Feb 09, 2018 01/15/2018  1:00 AM     Arterial Line 01/03/18 Left Femoral (Active)  Site Assessment Clean;Dry;Intact 01/14/2018  8:00 PM  Line Status Pulsatile blood flow 01/14/2018  8:00 PM  Art Line Waveform Appropriate;Square wave test performed 01/14/2018  8:00 PM  Art Line Interventions Zeroed and calibrated;Connections checked and tightened 01/14/2018  8:00 PM  Color/Movement/Sensation Capillary refill greater than 3 sec 01/14/2018   8:00 PM  Dressing Type Transparent;Occlusive 01/14/2018  8:00 PM  Dressing Status Clean;Dry;Intact;Antimicrobial disc in place 01/14/2018  8:00 PM  Interventions Other (Comment) 01/14/2018  8:00 PM  Dressing Change Due 01/17/18 01/14/2018  8:00 PM     NG/OG Tube Orogastric 16 Fr. Right mouth Xray Documented cm marking at nare/ corner of mouth (Active)  Site Assessment Clean;Dry;Intact 01/15/2018  4:00 AM  Ongoing Placement Verification No change in cm markings or external length of tube from initial placement;No change in respiratory status;No acute changes, not attributed to clinical condition 01/15/2018  4:00 AM  Status Suction-low intermittent 01/15/2018  4:00 AM  Drainage Appearance Tan 01/15/2018  4:00 AM  Intake (mL) 360 mL 01/11/2018  6:00 AM  Output (mL) 50 mL 01/15/2018  6:00 AM     Urethral Catheter Ryan Webb Double-lumen;Latex 16 Fr. (Active)  Indication for Insertion or Continuance of Catheter Aggressive IV diuresis 01/15/2018  4:00 AM  Site Assessment Clean;Intact;Swelling 01/15/2018  4:00 AM  Catheter Maintenance Bag below level of bladder;Catheter secured;Drainage bag/tubing not touching floor;Insertion date on drainage bag;No dependent loops;Seal intact 01/15/2018  4:00 AM  Collection Container Standard drainage bag 01/15/2018  4:00 AM  Securement Method Leg strap 01/15/2018  4:00 AM  Urinary Catheter Interventions Unclamped 01/15/2018  4:00 AM  Input (mL) 0 mL 01/11/2018  6:00 AM  Output (mL) 180 mL 01/15/2018  6:00 AM    Anti-infectives:  Anti-infectives (From admission, onward)   Start     Dose/Rate Route Frequency Ordered Stop   01/15/18 1000  anidulafungin (ERAXIS) 100 mg in sodium chloride 0.9 % 100 mL IVPB     100 mg 78 mL/hr over 100 Minutes Intravenous Every  24 hours 01/14/18 1105     01/14/18 2200  linezolid (ZYVOX) IVPB 600 mg     600 mg 300 mL/hr over 60 Minutes Intravenous Every 12 hours 01/14/18 1047     01/14/18 1230  anidulafungin (ERAXIS) 200 mg in sodium chloride 0.9 % 200 mL  IVPB     200 mg 78 mL/hr over 200 Minutes Intravenous  Once 01/14/18 1105 01/14/18 1551   01/14/18 0515  vancomycin (VANCOCIN) 1,500 mg in sodium chloride 0.9 % 500 mL IVPB     1,500 mg 250 mL/hr over 120 Minutes Intravenous  Once 01/14/18 0509 01/14/18 0810   01/13/18 1400  piperacillin-tazobactam (ZOSYN) IVPB 3.375 g     3.375 g 12.5 mL/hr over 240 Minutes Intravenous Every 8 hours 01/13/18 0811     01/13/18 0515  vancomycin (VANCOCIN) 1,500 mg in sodium chloride 0.9 % 500 mL IVPB     1,500 mg 250 mL/hr over 120 Minutes Intravenous  Once 01/13/18 0500 01/13/18 0741   01/10/18 1000  vancomycin (VANCOCIN) 2,000 mg in sodium chloride 0.9 % 500 mL IVPB     2,000 mg 250 mL/hr over 120 Minutes Intravenous  Once 01/10/18 0927 01/10/18 1225   01/10/18 1000  piperacillin-tazobactam (ZOSYN) IVPB 3.375 g  Status:  Discontinued     3.375 g 12.5 mL/hr over 240 Minutes Intravenous Every 12 hours 01/10/18 0927 01/13/18 0811   01/10/18 0918  vancomycin (VANCOCIN) IVPB 1000 mg/200 mL premix  Status:  Discontinued     1,000 mg 200 mL/hr over 60 Minutes Intravenous Every Dialysis 01/10/18 0927 01/14/18 1047   01/06/18 0900  vancomycin (VANCOCIN) 1,500 mg in sodium chloride 0.9 % 500 mL IVPB  Status:  Discontinued     1,500 mg 250 mL/hr over 120 Minutes Intravenous Every 24 hours 01/05/18 0858 01/05/18 1021   01/05/18 0700  vancomycin (VANCOCIN) IVPB 1000 mg/200 mL premix  Status:  Discontinued     1,000 mg 200 mL/hr over 60 Minutes Intravenous Every 24 hours 01/04/18 1303 01/05/18 0638   01/05/18 0700  vancomycin (VANCOCIN) 1,500 mg in sodium chloride 0.9 % 500 mL IVPB  Status:  Discontinued     1,500 mg 250 mL/hr over 120 Minutes Intravenous Every 24 hours 01/05/18 0638 01/05/18 0641   01/05/18 0700  vancomycin (VANCOCIN) 1,250 mg in sodium chloride 0.9 % 250 mL IVPB  Status:  Discontinued     1,250 mg 166.7 mL/hr over 90 Minutes Intravenous Every 12 hours 01/05/18 0641 01/05/18 0858   01/04/18 0700   vancomycin (VANCOCIN) IVPB 1000 mg/200 mL premix  Status:  Discontinued     1,000 mg 200 mL/hr over 60 Minutes Intravenous Every 24 hours 01/03/18 1204 01/04/18 1250   01/03/18 1400  vancomycin (VANCOCIN) 1,250 mg in sodium chloride 0.9 % 250 mL IVPB  Status:  Discontinued     1,250 mg 166.7 mL/hr over 90 Minutes Intravenous Every 12 hours 01/03/18 0740 01/03/18 1204   01/03/18 0630  vancomycin (VANCOCIN) 1,500 mg in sodium chloride 0.9 % 500 mL IVPB     1,500 mg 250 mL/hr over 120 Minutes Intravenous  Once 01/03/18 0619 01/03/18 1018   01/03/18 0630  piperacillin-tazobactam (ZOSYN) IVPB 3.375 g     3.375 g 12.5 mL/hr over 240 Minutes Intravenous Every 8 hours 01/03/18 0623 01/10/18 0011   01/03/18 0615  piperacillin-tazobactam (ZOSYN) IVPB 3.375 g  Status:  Discontinued     3.375 g 100 mL/hr over 30 Minutes Intravenous Every 8 hours 01/03/18 0608 01/03/18 4034  01/03/18 0615  vancomycin (VANCOCIN) 1,500 mg in sodium chloride 0.9 % 500 mL IVPB  Status:  Discontinued     1,500 mg 250 mL/hr over 120 Minutes Intravenous Every 12 hours 01/03/18 0608 01/03/18 0617   01/03/18 0615  vancomycin (VANCOCIN) IVPB 1000 mg/200 mL premix  Status:  Discontinued     1,000 mg 200 mL/hr over 60 Minutes Intravenous  Once 01/03/18 4656 01/03/18 8127      Microbiology: Results for orders placed or performed during the hospital encounter of 01/03/2018  Culture, blood (routine x 2)     Status: None   Collection Time: 01/03/18 10:42 AM  Result Value Ref Range Status   Specimen Description BLOOD LT [facility]  Final   Special Requests   Final    BOTTLES DRAWN AEROBIC AND ANAEROBIC Blood Culture adequate volume   Culture   Final    NO GROWTH 5 DAYS Performed at [facility], [address] [place], [place] [address]    Report Status 01/08/2018 FINAL  Final  MRSA PCR Screening     Status: None   Collection Time: 01/03/18 10:56 AM  Result Value Ref Range Status   MRSA by PCR NEGATIVE NEGATIVE Final     Comment:        The GeneXpert MRSA Assay (FDA approved for NASAL specimens only), is one component of a comprehensive MRSA colonization surveillance program. It is not intended to diagnose MRSA infection nor to guide or monitor treatment for MRSA infections. Performed at [facility], [address] [place], [place] [address]   CULTURE, BLOOD (ROUTINE X 2) w Reflex to ID Panel     Status: None   Collection Time: 01/03/18  1:25 PM  Result Value Ref Range Status   Specimen Description BLOOD LINE  Final   Special Requests   Final    BOTTLES DRAWN AEROBIC AND ANAEROBIC Blood Culture adequate volume   Culture   Final    NO GROWTH 5 DAYS Performed at [facility], [address], [place], [place] [address]    Report Status 01/08/2018 FINAL  Final  Culture, Urine     Status: None   Collection Time: 01/03/18  4:49 PM  Result Value Ref Range Status   Specimen Description   Final    URINE, RANDOM Performed at [facility], [address], [place], [place] [address]    Special Requests   Final    NONE Performed at [facility], [address], [place], [place] [address]    Culture   Final    NO GROWTH Performed at [facility], [place] [address], [place], [place] [address]    Report Status 01/04/2018 FINAL  Final  Culture, expectorated sputum-assessment     Status: None   Collection Time: 01/03/18  5:53 PM  Result Value Ref Range Status   Specimen Description TRACHEAL ASPIRATE  Final   Special Requests NONE  Final   Sputum evaluation   Final    THIS SPECIMEN IS ACCEPTABLE FOR SPUTUM CULTURE Performed at [facility], [address], [place], [place] [address]    Report Status 01/03/2018 FINAL  Final  Culture, respiratory (NON-Expectorated)     Status: None   Collection Time: 01/03/18  5:53 PM  Result Value Ref Range Status   Specimen Description   Final    TRACHEAL ASPIRATE Performed at [facility], [address], [place], [place] [address]    Special Requests  Final    NONE Reflexed from [id number] Performed at [facility], [place], [place] [address]    Gram Stain   Final    FEW WBC PRESENT, PREDOMINANTLY PMN RARE SQUAMOUS EPITHELIAL CELLS PRESENT FEW GRAM POSITIVE COCCI IN CLUSTERS FEW GRAM NEGATIVE COCCOBACILLI    Culture   Final    Consistent with normal respiratory flora. Performed at [facility], [place] [address] [place], [place] [address]    Report Status 01/06/2018 FINAL  Final  CULTURE, BLOOD (ROUTINE X 2) w Reflex to ID Panel     Status: None   Collection Time: 01/10/18  9:35 AM  Result Value Ref Range Status   Specimen Description BLOOD L WRIST  Final   Special Requests   Final    BOTTLES DRAWN AEROBIC AND ANAEROBIC Blood Culture adequate volume   Culture   Final    NO GROWTH 5 DAYS Performed at [facility], [address] [place], [place] [address]    Report Status 01/15/2018 FINAL  Final  CULTURE, BLOOD (ROUTINE X 2) w Reflex to ID Panel     Status: None   Collection Time: 01/10/18  9:47 AM  Result Value Ref Range Status   Specimen Description BLOOD R FOREARM  Final   Special Requests   Final    BOTTLES DRAWN AEROBIC AND ANAEROBIC Blood Culture adequate volume   Culture   Final    NO GROWTH 5 DAYS Performed at [facility], [address], [place], [place] [address]    Report Status 01/15/2018 FINAL  Final  Culture, respiratory (NON-Expectorated)     Status: None   Collection Time: 01/10/18 12:03 PM  Result Value Ref Range Status   Specimen Description   Final    TRACHEAL ASPIRATE Performed at [facility], [address] [place], [place] [address]    Special Requests   Final    NONE Performed at [facility], [address] [place], [place] [address]    Gram Stain   Final    ABUNDANT WBC PRESENT,BOTH PMN AND MONONUCLEAR RARE SQUAMOUS EPITHELIAL CELLS PRESENT RARE BUDDING  YEAST SEEN Performed at [facility], [place] [address], [place], [place] [address]    Culture MODERATE CANDIDA ALBICANS  Final   Report Status 01/13/2018 FINAL  Final  Culture, blood (Routine X 2) w Reflex to ID Panel     Status: None (Preliminary result)   Collection Time: 01/12/18 10:06 PM  Result Value Ref Range Status   Specimen Description BLOOD RIGHT HAND  Final   Special Requests   Final    BOTTLES DRAWN AEROBIC AND ANAEROBIC Blood Culture adequate volume   Culture   Final    NO GROWTH 3 DAYS Performed at [facility], [address], [place], [place] [address]    Report Status PENDING  Incomplete  Culture, blood (Routine X 2) w Reflex to ID Panel     Status: None (Preliminary result)   Collection Time: 01/12/18 10:07 PM  Result Value Ref Range Status   Specimen Description BLOOD RIGHT ANTECUBITAL  Final   Special Requests   Final    BOTTLES DRAWN AEROBIC AND ANAEROBIC Blood Culture results may not be optimal due to an excessive volume of blood received in culture bottles   Culture   Final    NO GROWTH 3 DAYS Performed at [facility], [address], [place],  [address]    Report Status PENDING  Incomplete  Culture, respiratory (NON-Expectorated)     Status: None (Preliminary result)   Collection Time: 01/13/18 12:28 AM  Result Value Ref Range Status   Specimen Description   Final    TRACHEAL ASPIRATE Performed at [facility], [address] [place], [place] [address]    Special Requests   Final    NONE Performed at [facility], [address] [place], [place] [address]    Gram Stain   Final    ABUNDANT WBC PRESENT, PREDOMINANTLY PMN NO ORGANISMS SEEN    Culture   Final    CULTURE REINCUBATED FOR BETTER GROWTH Performed at [facility], [place] [address], [place], [place] [address]    Report Status PENDING  Incomplete  Urine Culture     Status: None   Collection Time: 01/13/18  3:50 AM  Result Value Ref  Range Status   Specimen Description   Final    URINE, RANDOM Performed at [facility], [address] [place], [place] [address]    Special Requests   Final    NONE Performed at [facility], [address], [place], [place] [address]    Culture   Final    NO GROWTH Performed at [facility], [place] [address] [place], [place] [address]    Report Status 01/14/2018 FINAL  Final  Culture, respiratory (NON-Expectorated)     Status: None (Preliminary result)   Collection Time: 01/14/18 11:07 AM  Result Value Ref Range Status   Specimen Description   Final    TRACHEAL ASPIRATE Performed at [facility], [address] [place], [place] [address]    Special Requests   Final    NONE Performed at [facility], [address] [place], [place] [address]    Gram Stain   Final    RARE WBC PRESENT, PREDOMINANTLY PMN NO ORGANISMS SEEN Performed at [facility], [place] [address] [place],  [address]    Culture PENDING  Incomplete   Report Status PENDING  Incomplete    Studies: Dg Chest 1 View  Result Date: 01/03/2018 CLINICAL DATA:  New intubation. EXAM: CHEST  1 VIEW COMPARISON:  01/03/2018 FINDINGS: Endotracheal tube has tip 4 cm above the carina. Nasogastric tube courses into the stomach and off the inferior portion of the film. Right IJ central venous catheter has tip over the SVC. Lungs are adequately inflated with new hazy prominence of the perihilar vasculature likely mild interstitial edema. Possible small amount right pleural fluid unchanged. No pneumothorax. Stable cardiomegaly. Remainder of the exam is unchanged. IMPRESSION: Findings suggesting new mild interstitial edema. Stable small amount right pleural fluid. Stable cardiomegaly. Tubes and lines as described. Electronically Signed   By: Marin Olp M.D.   On: 01/03/2018 09:28   Dg Chest 2 View  Result Date: 01/08/2018 CLINICAL DATA:  Tachycardia and shortness of  breath EXAM: CHEST - 2 VIEW COMPARISON:  10/16/2013 FINDINGS: The heart size and mediastinal contours are within normal limits. Both lungs are clear. The visualized skeletal structures are unremarkable. IMPRESSION: No acute abnormality noted. Electronically Signed   By: Inez Catalina M.D.   On: 12/21/2017 11:40   Dg Abd 1 View  Result Date: 01/15/2018 CLINICAL DATA:  Vomiting. EXAM: ABDOMEN - 1 VIEW COMPARISON:  01/12/2018 FINDINGS: Artifact from pad limits evaluation. Abdomen is incompletely included within the field of view. There is scattered gas in the colon without abnormal small or large bowel distention. An enteric  tube is present in the upper mid abdomen consistent with location in the body of the stomach. Tubing projected over the right femoral region may represent a femoral venous catheter. Degenerative changes in the spine. IMPRESSION: Nonobstructive bowel gas pattern. Enteric tube tip projects over the body of the stomach. Electronically Signed   By: Lucienne Capers M.D.   On: 01/15/2018 04:42   Dg Abd 1 View  Result Date: 01/12/2018 CLINICAL DATA:  52 year old male with NG tube placement. EXAM: PORTABLE CHEST 1 VIEW COMPARISON:  CT of the abdomen pelvis dated 12/18/2017 and chest radiograph dated 01/11/2018 FINDINGS: An endotracheal tube is noted with tip approximately 5 cm above the carina. An enteric tube extends into the left hemiabdomen with tip to the left of the L1 spine over the gastric air likely in the mid body of the stomach. Right IJ central venous line with tip over central SVC. There is cardiomegaly with small bilateral pleural effusions and associated atelectatic changes. Pneumonia is not excluded. Clinical correlation is recommended. There is no pneumothorax. No acute osseous pathology. IMPRESSION: 1. Cardiomegaly with small bilateral pleural effusions and associated atelectatic changes. 2. Support devices as described. Electronically Signed   By: Anner Crete M.D.   On:  01/12/2018 05:56   Ct Head Wo Contrast  Result Date: 01/14/2018 CLINICAL DATA:  52 y/o  M; altered level of consciousness. EXAM: CT HEAD WITHOUT CONTRAST TECHNIQUE: Contiguous axial images were obtained from the base of the skull through the vertex without intravenous contrast. COMPARISON:  01/03/2018 CT head. FINDINGS: Brain: No evidence of acute infarction, hemorrhage, hydrocephalus, extra-axial collection or mass lesion/mass effect. Few stable nonspecific foci of hypoattenuation within subcortical white matter are compatible with mild chronic microvascular ischemic changes and there is mild brain parenchymal volume loss. Vascular: Mild calcific atherosclerosis of carotid siphons. Skull: Normal. Negative for fracture or focal lesion. Sinuses/Orbits: Small fluid level in the right posterior ethmoid air cells and the sphenoid sinus likely due to intubation. Right mastoid effusion. Other: None. IMPRESSION: No acute intracranial abnormality identified. Stable negative CT of the head. Electronically Signed   By: Kristine Garbe M.D.   On: 01/14/2018 02:42   Ct Head Wo Contrast  Result Date: 01/03/2018 CLINICAL DATA:  Altered mental status change since yesterday. Unresponsive. EXAM: CT HEAD WITHOUT CONTRAST TECHNIQUE: Contiguous axial images were obtained from the base of the skull through the vertex without intravenous contrast. COMPARISON:  None. FINDINGS: Brain: No evidence of acute infarction, hemorrhage, hydrocephalus, extra-axial collection or mass lesion/mass effect. Vascular: No hyperdense vessel or unexpected calcification. Skull: Possible fracture along the posterolateral wall the right maxillary sinus as there is a small amount of air within the soft tissues just superficial to this region. Sinuses/Orbits: No acute finding. Other: None. IMPRESSION: No acute brain injury. Subtle air in the soft tissues just superficial to the posterolateral wall of the right maxillary sinus as cannot exclude a  subtle fracture of the wall of the adjacent sinus wall. Recommend clinical correlation. Electronically Signed   By: Marin Olp M.D.   On: 01/03/2018 10:18   Ct Chest Wo Contrast  Result Date: 01/14/2018 CLINICAL DATA:  Acute respiratory illness. Interstitial lung disease. Acute renal failure secondary to severe hypotension. Metabolic acidosis. Acute respiratory failure. Generalized edema. EXAM: CT CHEST WITHOUT CONTRAST TECHNIQUE: Multidetector CT imaging of the chest was performed following the standard protocol without IV contrast. COMPARISON:  01/03/2018 FINDINGS: Cardiovascular: Cardiac enlargement. No pericardial effusion. Normal caliber thoracic aorta. Minimal aortic calcification. Right central venous  catheter with tip in the low SVC. Mediastinum/Nodes: Enteric tube with tip in the distal stomach. Esophagus is decompressed. Diffusely enlarged mediastinal lymph nodes. AP window nodes measure up to 13 mm short axis dimension. Right paratracheal nodes measure up to 1.5 cm short axis dimension. Lymph nodes are similar in appearance to previous study. Lungs/Pleura: Motion artifact limits examination. There are small bilateral pleural effusions with consolidation and atelectasis in both lung bases. This appearance is similar to previous study. There appears to be developing airspace infiltration throughout the right lung since previous study. This could represent increasing edema or multifocal pneumonia. No pneumothorax. Airways are patent. Upper Abdomen: Surgical absence of the gallbladder. Musculoskeletal: No chest wall mass or suspicious bone lesions identified. Degenerative changes in the thoracic spine. IMPRESSION: Cardiac enlargement. Small bilateral pleural effusions with consolidation and atelectasis in both lung bases. There appears to be developing airspace disease in the right lung since previous study. This could represent increasing edema or multifocal pneumonia. Mediastinal lymphadenopathy is  similar to previous study and likely reactive. Aortic Atherosclerosis (ICD10-I70.0). Electronically Signed   By: Lucienne Capers M.D.   On: 01/14/2018 02:50   Ct Abdomen Pelvis W Contrast  Result Date: 12/16/2017 CLINICAL DATA:  Abdominal distension EXAM: CT ABDOMEN AND PELVIS WITH CONTRAST TECHNIQUE: Multidetector CT imaging of the abdomen and pelvis was performed using the standard protocol following bolus administration of intravenous contrast. CONTRAST:  180m ISOVUE-300 IOPAMIDOL (ISOVUE-300) INJECTION 61% COMPARISON:  None. FINDINGS: Lower chest: Small right pleural effusion is noted. No focal infiltrate is identified. Hepatobiliary: Diffuse decreased attenuation in the liver is noted consistent with fatty infiltration. The gallbladder has been surgically removed. Pancreas: Unremarkable. No pancreatic ductal dilatation or surrounding inflammatory changes. Spleen: Normal in size without focal abnormality. Adrenals/Urinary Tract: Adrenal glands are within normal limits. The kidneys demonstrate a nonobstructing right lower pole renal stone. No obstructive changes are seen. No ureteral stones are noted. The bladder is well distended. Stomach/Bowel: Scattered diverticular changes noted. No evidence of diverticulitis is seen. No obstructive or inflammatory changes are noted. The appendix is within normal limits. Vascular/Lymphatic: No significant vascular findings are present. No enlarged abdominal or pelvic lymph nodes. Reproductive: Prostate is unremarkable. Other: No abdominal wall hernia or abnormality. No abdominopelvic ascites. Musculoskeletal: Degenerative changes of lumbar spine are noted. IMPRESSION: Tiny nonobstructing right lower pole renal stone. Small right-sided pleural effusion. No other focal abnormality is noted to correspond with the patient's given clinical history. Electronically Signed   By: MInez CatalinaM.D.   On: 12/18/2017 13:30   [place]Venous Img Lower Bilateral  Result Date:  01/05/2018 CLINICAL DATA:  Bilateral lower extremity swelling EXAM: BILATERAL LOWER EXTREMITY VENOUS DOPPLER ULTRASOUND TECHNIQUE: Gray-scale sonography with graded compression, as well as color Doppler and duplex ultrasound were performed to evaluate the lower extremity deep venous systems from the level of the common femoral vein and including the common femoral, femoral, profunda femoral, popliteal and calf veins including the posterior tibial, peroneal and gastrocnemius veins when visible. The superficial great saphenous vein was also interrogated. Spectral Doppler was utilized to evaluate flow at rest and with distal augmentation maneuvers in the common femoral, femoral and popliteal veins. COMPARISON:  None. FINDINGS: RIGHT LOWER EXTREMITY Common Femoral Vein: Unable to be evaluated due to overlying dressing from recent catheter placement Saphenofemoral Junction: Unable to be evaluated due to overlying dressing from recent catheter placement Profunda Femoral Vein: Unable to be evaluated due to overlying dressing from recent catheter placement Femoral Vein: No evidence of  thrombus. Normal compressibility, respiratory phasicity and response to augmentation. Popliteal Vein: No evidence of thrombus. Normal compressibility, respiratory phasicity and response to augmentation. Calf Veins: No evidence of thrombus. Normal compressibility and flow on color Doppler imaging. Superficial Great Saphenous Vein: No evidence of thrombus. Normal compressibility. Venous Reflux:  None. Other Findings:  None. LEFT LOWER EXTREMITY Common Femoral Vein: No evidence of thrombus. Normal compressibility, respiratory phasicity and response to augmentation. Saphenofemoral Junction: No evidence of thrombus. Normal compressibility and flow on color Doppler imaging. Profunda Femoral Vein: No evidence of thrombus. Normal compressibility and flow on color Doppler imaging. Femoral Vein: No evidence of thrombus. Normal compressibility,  respiratory phasicity and response to augmentation. Popliteal Vein: No evidence of thrombus. Normal compressibility, respiratory phasicity and response to augmentation. Calf Veins: No evidence of thrombus. Normal compressibility and flow on color Doppler imaging. Superficial Great Saphenous Vein: No evidence of thrombus. Normal compressibility. Venous Reflux:  None. Other Findings:  None. IMPRESSION: No evidence of deep venous thrombosis. The right femoral vein has a central venous line within and could not be well evaluated. Electronically Signed   By: Inez Catalina M.D.   On: 01/05/2018 15:22   Dg Chest Port 1 View  Result Date: 01/15/2018 CLINICAL DATA:  Acute respiratory failure EXAM: PORTABLE CHEST 1 VIEW COMPARISON:  Chest 01/13/2018 FINDINGS: Endotracheal tube with tip measuring 3.4 cm above the carina. Enteric tube tip is off the field of view but below the left hemidiaphragm. Right central venous catheter with tip over the low SVC region. Cardiac enlargement. Bilateral pleural effusions with basilar atelectasis. IMPRESSION: Appliances appear in satisfactory position. Cardiac enlargement. Bilateral pleural effusions with basilar atelectasis. Electronically Signed   By: Lucienne Capers M.D.   On: 01/15/2018 04:41   Dg Chest Port 1 View  Result Date: 01/13/2018 CLINICAL DATA:  52 year old male with sepsis and respiratory failure. Acute renal failure. Intubated. EXAM: PORTABLE CHEST 1 VIEW COMPARISON:  01/12/2018 and earlier. FINDINGS: Portable AP semi upright view at 0756 hours. Stable endotracheal tube tip at the level the clavicles. Stable right IJ central line. Enteric tube courses to the left upper quadrant, tip not included. Stable lung volumes. No pneumothorax. Stable pulmonary vascularity without overt edema. Dense retrocardiac and veiling bibasilar opacity is stable. Paucity of bowel gas in the upper abdomen. IMPRESSION: 1.  Stable lines and tubes. 2. Stable ventilation with bilateral lower lobe  collapse or consolidation and probable small pleural effusions. Electronically Signed   By: Genevie Ann M.D.   On: 01/13/2018 09:13   Dg Chest Port 1 View  Result Date: 01/12/2018 CLINICAL DATA:  52 year old male with NG tube placement. EXAM: PORTABLE CHEST 1 VIEW COMPARISON:  CT of the abdomen pelvis dated 12/29/2017 and chest radiograph dated 01/11/2018 FINDINGS: An endotracheal tube is noted with tip approximately 5 cm above the carina. An enteric tube extends into the left hemiabdomen with tip to the left of the L1 spine over the gastric air likely in the mid body of the stomach. Right IJ central venous line with tip over central SVC. There is cardiomegaly with small bilateral pleural effusions and associated atelectatic changes. Pneumonia is not excluded. Clinical correlation is recommended. There is no pneumothorax. No acute osseous pathology. IMPRESSION: 1. Cardiomegaly with small bilateral pleural effusions and associated atelectatic changes. 2. Support devices as described. Electronically Signed   By: Anner Crete M.D.   On: 01/12/2018 05:56   Dg Chest Port 1 View  Result Date: 01/11/2018 CLINICAL DATA:  Fever EXAM: PORTABLE CHEST  1 VIEW COMPARISON:  Test radiograph 01/10/2018 FINDINGS: ET tube terminates in the mid trachea. Enteric tube courses inferior to the diaphragm. Right IJ central venous catheter tip projects over the superior vena cava. Stable cardiomegaly. Interval increase in bilateral airspace opacities. Small bilateral pleural effusions. IMPRESSION: Stable support apparatus. Cardiomegaly. Similar-appearing diffuse bilateral airspace opacities. Small layering bilateral effusions. Electronically Signed   By: Lovey Newcomer M.D.   On: 01/11/2018 14:52   Dg Chest Port 1 View  Result Date: 01/10/2018 CLINICAL DATA:  Acute respiratory distress. EXAM: PORTABLE CHEST 1 VIEW COMPARISON:  01/09/2018. FINDINGS: Endotracheal tube in satisfactory position. Right jugular catheter tip in the superior  vena cava. Nasogastric tube extending into the stomach. Stable enlarged cardiac silhouette. Mild increase in bibasilar opacity. Thoracic spine degenerative changes. IMPRESSION: Increased bibasilar atelectasis or pneumonia with stable cardiomegaly and probable pleural effusions. Electronically Signed   By: Claudie Revering M.D.   On: 01/10/2018 07:34   Dg Chest Port 1 View  Result Date: 01/09/2018 CLINICAL DATA:  On mechanically assisted ventilation. EXAM: PORTABLE CHEST 1 VIEW COMPARISON:  01/10/2018 FINDINGS: Endotracheal tube is 4.7 cm above the carina. Nasogastric tube extends into the abdomen. Right jugular central line tip in the SVC region. Negative for pneumothorax. Hazy densities at both lung bases are suggestive for volume loss and possibly pleural effusions. Lung aeration has minimal change from the recent comparison examination. Heart size is upper limits of normal. Central vascular structures are mildly prominent. IMPRESSION: Stable appearance of the support apparatuses. Endotracheal tube is appropriately positioned above the carina. Persistent basilar chest densities. Findings are suggestive for combination of volume loss/consolidation and pleural effusions. Minimal change from the recent comparison examination. Electronically Signed   By: Markus Daft M.D.   On: 01/09/2018 12:41   Dg Chest Port 1 View  Result Date: 01/08/2018 CLINICAL DATA:  Respiratory failure EXAM: PORTABLE CHEST 1 VIEW COMPARISON:  Yesterday FINDINGS: Endotracheal tube tip just below the clavicular heads. Right IJ line with tip at the SVC. An orogastric tube reaches the stomach. Cardiopericardial enlargement, stable. Haziness of the bilateral chest attributed to layering pleural fluid. No Kerley lines or air bronchogram. Remote left clavicle fracture. IMPRESSION: 1. Stable compared to yesterday. 2. Layering pleural effusions and atelectasis. 3. Unremarkable hardware positioning. Electronically Signed   By: Monte Fantasia M.D.    On: 01/08/2018 08:50   Dg Chest Port 1 View  Result Date: 01/07/2018 CLINICAL DATA:  Acute respiratory failure EXAM: PORTABLE CHEST 1 VIEW COMPARISON:  01/04/2018 FINDINGS: Cardiac shadow is stable. Endotracheal tube, nasogastric catheter and right jugular central line are again seen and stable. Right-sided pleural effusion is again identified. Mild central vascular congestion is again noted. Mild bibasilar atelectasis is again noted. IMPRESSION: Stable bibasilar atelectasis and moderate right effusion. Electronically Signed   By: Inez Catalina M.D.   On: 01/07/2018 10:52   Portable Chest Xray  Result Date: 01/04/2018 CLINICAL DATA:  Respiratory failure.  Follow-up exam. EXAM: PORTABLE CHEST 1 VIEW COMPARISON:  01/03/2018 and older studies. FINDINGS: Endotracheal tube, nasogastric tube and right internal jugular central venous line are stable. Vascular congestion, mild interstitial thickening and hazy lung base opacity, right greater than left, is similar to the prior exam allowing for differences in patient positioning. Lung base opacities consistent with combination of pleural effusions and atelectasis. No new lung abnormalities.  No pneumothorax. IMPRESSION: 1. No significant change from the most recent prior exam. 2. Persistent right greater than left pleural effusions with associated atelectasis. Vascular congestion without  overt pulmonary edema. 3. Stable well-positioned support apparatus. Electronically Signed   By: Lajean Manes M.D.   On: 01/04/2018 08:01   Dg Chest Port 1 View  Result Date: 01/03/2018 CLINICAL DATA:  Sudden onset respiratory distress in inpatient. EXAM: PORTABLE CHEST 1 VIEW COMPARISON:  12/22/2017 FINDINGS: Cardiac enlargement. Pulmonary vascularity is normal. Probable small right pleural effusion developing since previous study. Atelectasis in the lung bases. No focal consolidation is suggested. No pneumothorax. IMPRESSION: Cardiac enlargement. Developing small right pleural  effusion. Atelectasis in the lung bases. Electronically Signed   By: Lucienne Capers M.D.   On: 01/03/2018 06:42   Ct Angio Chest/abd/pel For Dissection W And/or W/wo  Result Date: 01/03/2018 CLINICAL DATA:  Pt intubated. Change in mental status since yesterday. Yesterday pt walking and talking, today pt unresponsive. Admitted to hospital with abdominal discomfort, SOB and tachycardia. Pt in acute respiratory distress. EXAM: CT ANGIOGRAPHY CHEST, ABDOMEN AND PELVIS TECHNIQUE: Multidetector CT imaging through the chest, abdomen and pelvis was performed using the standard protocol during bolus administration of intravenous contrast. Multiplanar reconstructed images and MIPs were obtained and reviewed to evaluate the vascular anatomy. CONTRAST:  146m ISOVUE-370 IOPAMIDOL (ISOVUE-370) INJECTION 76% COMPARISON:  Current chest radiograph. FINDINGS: CTA CHEST FINDINGS Cardiovascular: Nurse satisfactory to opacification the segmental of the pulmonary arteries level. Study is limited by respiratory motion. Allowing for this, there is no evidence of a pulmonary embolism. Heart is mildly enlarged. No pericardial effusion. Mild three-vessel coronary artery calcifications. Great vessels normal in caliber. Mild atherosclerotic calcifications noted along the aortic arch. Mediastinum/Nodes: No neck base or axillary masses or pathologically enlarged lymph nodes. There is mediastinal adenopathy. Largest prevascular node is 16 mm short axis. Largest right paratracheal node measures 3 cm in short axis. Nodes are predominantly shotty. Prominent nodes are noted along the hila, right greater than left. The trachea is patent. Endotracheal tube is well positioned. Nasal/orogastric tube passes below the diaphragm into the proximal to mid stomach. Lungs/Pleura: Small to moderate right and minimal left pleural effusions. There is dependent lower lobe opacity consistent with atelectasis. There is subtle areas of hazy opacity in the upper  lobes that are likely due to the expiratory nature of the images and air trapping. No convincing pneumonia or pulmonary edema. No pneumothorax. Review of the MIP images confirms the above findings. CTA ABDOMEN AND PELVIS FINDINGS VASCULAR Aorta: Aorta is normal in caliber. No dissection. Minor atherosclerotic disease noted along the infrarenal abdominal aorta. Celiac: Patent without evidence of aneurysm, dissection, vasculitis or significant stenosis. SMA: Patent without evidence of aneurysm, dissection, vasculitis or significant stenosis. Renals: Small renal arteries. Are dual renal arteries on the right. No plaque or stenosis. No evidence of vasculitis or fibromuscular dysplasia. IMA: Patent without evidence of aneurysm, dissection, vasculitis or significant stenosis. Inflow: The iliac arteries are relatively small. There is mild atherosclerotic disease along the common iliac arteries and internal iliac arteries. No hemodynamically significant stenosis of either common iliac or external iliac artery. Veins: No obvious venous abnormality within the limitations of this arterial phase study. Review of the MIP images confirms the above findings. NON-VASCULAR Hepatobiliary: No focal liver abnormality is seen. Status post cholecystectomy. No biliary dilatation. Pancreas: Unremarkable. No pancreatic ductal dilatation or surrounding inflammatory changes. Spleen: Normal in size without focal abnormality. Adrenals/Urinary Tract: No adrenal masses. Mild bilateral renal cortical thinning. Nonobstructing stone in the lower pole the right kidney. No hydronephrosis. Ureters are normal course and caliber. No ureteral stones. Bladder is decompressed by a Foley catheter.  Stomach/Bowel: Stomach is within normal limits. Appendix appears normal. No evidence of bowel wall thickening, distention, or inflammatory changes. Lymphatic: No pathologically enlarged lymph nodes. Reproductive: Unremarkable. Other: There is a small amount of  pelvic free fluid MUSCULOSKELETAL FINDINGS No fracture or acute finding. No osteoblastic or osteolytic lesions. Review of the MIP images confirms the above findings. IMPRESSION: CTA CHEST 1. No evidence of a pulmonary embolism. 2. Small to moderate right and minimal left pleural effusions associated with dependent lower lobe atelectasis. 3. Mild hazy increased opacity in the upper lobes. This is most likely due to air trapping. 4. No convincing pneumonia.  No pulmonary edema. CTA ABDOMEN AND PELVIS 1. No aortic dissection or aneurysm. Minor aortic atherosclerotic change. Aortic branch vessels are widely patent. 2. No acute findings within the abdomen or pelvis. 3. Small amount of pelvic free fluid, nonspecific. 4. Small nonobstructing stone in the lower pole the right kidney. Electronically Signed   By: Lajean Manes M.D.   On: 01/03/2018 10:06    Consults: Treatment Team:  Isaias Cowman, MD Bettey Costa, MD   Subjective:    Overnight Issues: over the last 24 hours patient  Has undergone bronchoscopy with BAL, antibiotics have been adjusted, with improved temperature curve  Objective:  Vital signs for last 24 hours: Temp:  [99 F (37.2 C)-100.8 F (38.2 C)] 100.4 F (38 C) (07/04 0400) Pulse Rate:  [114-125] 121 (07/04 0500) Resp:  [25-37] 25 (07/04 0600) BP: (112-141)/(45-105) 118/87 (07/04 0600) SpO2:  [91 %-100 %] 91 % (07/04 0500) FiO2 (%):  [40 %-80 %] 50 % (07/04 0346) Weight:  [319 lb 14.2 oz (145.1 kg)] 319 lb 14.2 oz (145.1 kg) (07/04 0438)  Hemodynamic parameters for last 24 hours:    Intake/Output from previous day: 07/03 0701 - 07/04 0700 In: 1311.9 [I.V.:856.2; NG/GT:240; IV Piggyback:215.7] Out: 2690 [Urine:2640; Emesis/NG output:50]  Intake/Output this shift: No intake/output data recorded.  Vent settings for last 24 hours: Vent Mode: PRVC FiO2 (%):  [40 %-80 %] 50 % Set Rate:  [25 bmp] 25 bmp Vt Set:  [500 mL] 500 mL PEEP:  [5 cmH20] 5 cmH20 Plateau  Pressure:  [23 cmH20] 23 cmH20  Physical Exam:  GENERAL:critically ill appearing,on mechanical ventilation HEENT: Oral endotracheal tube in place, oral gastric tube, trachea is midline PULMONARY: coarse rhonchi appreciated CARDIOVASCULAR: tachycardia appreciated, on monitor it appears to be atrial flutter with 2-1 AV nodal conduction  GASTROINTESTINAL: Soft exam, hypoactive bowel sounds, no appreciable organomegaly.  MUSCULOSKELETAL: anasarca NEUROLOGIC: limited exam, patient does have a gag, does breathe spontaneously   Assessment/Plan:   Resp failure. Patient with evidence of multilobar pneumonia right greater than left. Status post bronchoscopy with thick greenish mucopurulent secretions in the right. Sent off for Gram stain and C&S.pending results. Continues on 50% FiO2 and 5 of PEEP without making any progress towards weaning. Will most likely require tracheostomy, consulted palliative care to assist with goals of care discussion  Sepsis/pneumonia. Patient had ongoing temperature spikes, status post bronchoscopy, change vancomycin to linezolid, started Eraxis empirically and continued Zosyn with improved temperature curve  Renal Failure-most likely due to ATN BUN. Good urinary output yesterday, diuresed 1.378 L.no acidosis or hyperkalemia  Hypernatremia improving on free water  Encephalopathic. Patient has been slow to wake up, CT scan head was negative, pending neurology input although most likely is residual clearing of sedatives in the setting of renal failure and obesity  Diffuse mottling noted. No clear evidence of vasculitis, complement levels are within normal range,  rheumatoid factor negative, pending remainder of connective tissue studies  Have been keeping family/wife up-to-date daily. Considering tracheostomy although consult and palliative care to help delineate future goals of care    Critical Care Total Time 40 minutes  Ryan Webb 01/15/2018  *Care during  the described time interval was provided by me and/or other providers on the critical care team.  I have reviewed this patient's available data, including medical history, events of note, physical examination and test results as part of my evaluation.

## 2018-01-15 NOTE — Progress Notes (Signed)
Patient was sleeping. Chaplain provided energetic prayer and presence for both patient and nurse.

## 2018-01-15 NOTE — Progress Notes (Signed)
Patient was placed on 100% FIO2 after unable to keep sats greater than 88%. Patient was airway suctioned without any secretions. Prior to this patient had tube feeding in his subglottic suction tubing. Tube feeding was stopped immediately and CXR and KUB were ordered.  Fentanyl increased.  Awaiting results.

## 2018-01-16 ENCOUNTER — Inpatient Hospital Stay: Payer: BLUE CROSS/BLUE SHIELD

## 2018-01-16 DIAGNOSIS — Z515 Encounter for palliative care: Secondary | ICD-10-CM

## 2018-01-16 DIAGNOSIS — Z9911 Dependence on respirator [ventilator] status: Secondary | ICD-10-CM

## 2018-01-16 DIAGNOSIS — Z7189 Other specified counseling: Secondary | ICD-10-CM

## 2018-01-16 LAB — CBC
HCT: 33.9 % — ABNORMAL LOW (ref 40.0–52.0)
HEMOGLOBIN: 10.3 g/dL — AB (ref 13.0–18.0)
MCH: 32.6 pg (ref 26.0–34.0)
MCHC: 30.5 g/dL — ABNORMAL LOW (ref 32.0–36.0)
MCV: 106.8 fL — ABNORMAL HIGH (ref 80.0–100.0)
Platelets: 343 10*3/uL (ref 150–440)
RBC: 3.18 MIL/uL — AB (ref 4.40–5.90)
RDW: 19.6 % — ABNORMAL HIGH (ref 11.5–14.5)
WBC: 20.2 10*3/uL — ABNORMAL HIGH (ref 3.8–10.6)

## 2018-01-16 LAB — RENAL FUNCTION PANEL
Albumin: 2.8 g/dL — ABNORMAL LOW (ref 3.5–5.0)
Anion gap: 18 — ABNORMAL HIGH (ref 5–15)
BUN: 180 mg/dL — AB (ref 6–20)
CHLORIDE: 103 mmol/L (ref 98–111)
CO2: 25 mmol/L (ref 22–32)
Calcium: 8 mg/dL — ABNORMAL LOW (ref 8.9–10.3)
Creatinine, Ser: 3.36 mg/dL — ABNORMAL HIGH (ref 0.61–1.24)
GFR calc Af Amer: 23 mL/min — ABNORMAL LOW (ref >60)
GFR, EST NON AFRICAN AMERICAN: 20 mL/min — AB (ref >60)
Glucose, Bld: 249 mg/dL — ABNORMAL HIGH (ref 70–99)
POTASSIUM: 5 mmol/L (ref 3.5–5.1)
Phosphorus: 5.4 mg/dL — ABNORMAL HIGH (ref 2.5–4.6)
Sodium: 146 mmol/L — ABNORMAL HIGH (ref 135–145)

## 2018-01-16 LAB — GLUCOSE, CAPILLARY
GLUCOSE-CAPILLARY: 162 mg/dL — AB (ref 70–99)
GLUCOSE-CAPILLARY: 188 mg/dL — AB (ref 70–99)
Glucose-Capillary: 221 mg/dL — ABNORMAL HIGH (ref 70–99)
Glucose-Capillary: 187 mg/dL — ABNORMAL HIGH (ref 70–99)
Glucose-Capillary: 162 mg/dL — ABNORMAL HIGH (ref 70–99)
Glucose-Capillary: 179 mg/dL — ABNORMAL HIGH (ref 70–99)

## 2018-01-16 LAB — CULTURE, RESPIRATORY W GRAM STAIN

## 2018-01-16 LAB — MAGNESIUM: MAGNESIUM: 3.4 mg/dL — AB (ref 1.7–2.4)

## 2018-01-16 LAB — CULTURE, RESPIRATORY

## 2018-01-16 LAB — HEPARIN LEVEL (UNFRACTIONATED): HEPARIN UNFRACTIONATED: 0.34 [IU]/mL (ref 0.30–0.70)

## 2018-01-16 MED ORDER — FLUCONAZOLE IN SODIUM CHLORIDE 200-0.9 MG/100ML-% IV SOLN
200.0000 mg | INTRAVENOUS | Status: DC
  Administered 2018-01-19 – 2018-01-20 (×2): 200 mg via INTRAVENOUS
  Filled 2018-01-16 (×3): qty 100

## 2018-01-16 MED ORDER — PIPERACILLIN-TAZOBACTAM 3.375 G IVPB: 3.3750 g | Freq: Two times a day (BID) | INTRAVENOUS | Status: DC

## 2018-01-16 NOTE — Progress Notes (Signed)
This note also relates to the following rows which could not be included: Resp - Cannot attach notes to unvalidated device data  Hd completed  

## 2018-01-16 NOTE — Progress Notes (Signed)
[facility], [place] 01/16/18  Subjective:   Critically ill, Sedated, vent support FiO2 35%, tube feeds on hold,  patient is continued on amiodarone, fentanyl, heparin infusions.   He remains tachycardic with heart rate in the 120's.    Urine output is lower now recorded at 1075 cc last 24 hours BUN/Creatinine are worse Fever curve has improved, cooling blanket, started linezolid   Objective:  Vital signs in last 24 hours:  Temp:  [99.2 F (37.3 C)-99.6 F (37.6 C)] 99.2 F (37.3 C) (07/05 0400) Pulse Rate:  [117-128] 117 (07/05 0100) Resp:  [25] 25 (07/05 0500) BP: (90-121)/(17-81) 102/68 (07/05 0511) SpO2:  [75 %-100 %] 100 % (07/05 0727) FiO2 (%):  [35 %-40 %] 35 % (07/05 0727) Weight:  [145.6 kg (320 lb 15.8 oz)] 145.6 kg (320 lb 15.8 oz) (07/05 0500)  Weight change: 0.5 kg (1 lb 1.6 oz) Filed Weights   01/14/18 0450 01/15/18 0438 01/16/18 0500  Weight: (!) 136.8 kg (301 lb 9.4 oz) (!) 145.1 kg (319 lb 14.2 oz) (!) 145.6 kg (320 lb 15.8 oz)    Intake/Output:    Intake/Output Summary (Last 24 hours) at 01/16/2018 1005 Last data filed at 01/16/2018 0520 Gross per 24 hour  Intake 1052.21 ml  Output 1015 ml  Net 37.21 ml     Physical Exam: General:  Critically ill-appearing, laying in the bed  HEENT   ET tube in place  Pulm/lungs  ventilator assisted,    CVS/Heart  sinus tachycardia in the 120s  Abdomen:   Soft, nontender, nondistended, scrotal edema  Extremities:  2+ generalized pitting edema  Neurologic:  Sedated  Skin:  mottling rt leg and rt arm  Access: Rt femoral vascath   Foley catheter in place    Basic Metabolic Panel:  Recent Labs  Lab 01/12/18 0535 01/13/18 0350 01/13/18 0942 01/14/18 0205 01/14/18 0430 01/15/18 0440 01/16/18 0756  NA 144 153* 154*  --  148* 148* 146*  K 3.6 2.3* 2.9*  --  3.8 4.1 5.0  CL 105 112* 115*  --  112* 110 103  CO2 29 32 28  --  24 26 25   GLUCOSE 213* 162* 219*  --  215* 257* 249*   BUN 98* 91* 90*  --  119* 139* 180*  CREATININE 2.24* 1.99* 1.85*  --  2.30* 2.43* 3.36*  CALCIUM 8.2* 8.2* 8.2*  --  8.0* 8.1* 8.0*  MG 2.2 2.4  --  2.4  --  2.5* 3.4*  PHOS 6.2* 3.6  --   --  6.1* 5.2* 5.4*     CBC: Recent Labs  Lab 01/11/18 0428 01/13/18 0350 01/14/18 0205 01/15/18 0440 01/16/18 0756  WBC 9.7 7.5 9.9 15.3* 20.2*  HGB 8.9* 9.0* 10.3* 10.3* 10.3*  HCT 26.9* 28.0* 32.2* 32.7* 33.9*  MCV 102.7* 102.9* 103.7* 104.6* 106.8*  PLT 124* 184 278 300 343      Lab Results  Component Value Date   HEPBSAG Negative 01/09/2018   HEPBSAB Non Reactive 01/09/2018   HEPBIGM Negative 01/03/2018      Microbiology:  Recent Results (from the past 240 hour(s))  CULTURE, BLOOD (ROUTINE X 2) w Reflex to ID Panel     Status: None   Collection Time: 01/10/18  9:35 AM  Result Value Ref Range Status   Specimen Description BLOOD L WRIST  Final   Special Requests   Final    BOTTLES DRAWN AEROBIC AND ANAEROBIC Blood Culture adequate volume   Culture  Final    NO GROWTH 5 DAYS Performed at [facility], [address] [place], [place] [address]    Report Status 01/15/2018 FINAL  Final  CULTURE, BLOOD (ROUTINE X 2) w Reflex to ID Panel     Status: None   Collection Time: 01/10/18  9:47 AM  Result Value Ref Range Status   Specimen Description BLOOD R FOREARM  Final   Special Requests   Final    BOTTLES DRAWN AEROBIC AND ANAEROBIC Blood Culture adequate volume   Culture   Final    NO GROWTH 5 DAYS Performed at [facility], [address], [place],  [place] [address]    Report Status 01/15/2018 FINAL  Final  Culture, respiratory (NON-Expectorated)     Status: None   Collection Time: 01/10/18 12:03 PM  Result Value Ref Range Status   Specimen Description   Final    TRACHEAL ASPIRATE Performed at [facility], [address], [place], [place] [address]    Special Requests   Final    NONE Performed at [facility], [address] [place], [place] [address]    Gram Stain   Final    ABUNDANT WBC PRESENT,BOTH PMN AND MONONUCLEAR RARE SQUAMOUS EPITHELIAL CELLS PRESENT RARE BUDDING YEAST SEEN Performed at [facility], [place] [address] [place], [place] [address]    Culture MODERATE CANDIDA ALBICANS  Final   Report Status 01/13/2018 FINAL  Final  Culture, blood (Routine X 2) w Reflex to ID Panel     Status: None (Preliminary result)   Collection Time: 01/12/18 10:06 PM  Result Value Ref Range Status   Specimen Description BLOOD RIGHT HAND  Final   Special Requests   Final    BOTTLES DRAWN AEROBIC AND ANAEROBIC Blood Culture adequate volume   Culture   Final    NO GROWTH 4 DAYS Performed at [facility], [address], [place], [place] [address]    Report Status PENDING  Incomplete  Culture, blood (Routine X 2) w Reflex to ID Panel     Status: None (Preliminary result)   Collection Time: 01/12/18 10:07 PM  Result Value Ref Range Status   Specimen Description BLOOD RIGHT ANTECUBITAL  Final   Special Requests   Final    BOTTLES DRAWN AEROBIC AND ANAEROBIC Blood Culture results may not be optimal due to an excessive volume of blood received in culture bottles   Culture   Final    NO GROWTH 4 DAYS Performed at [facility], [address] [place], [place] [address]    Report Status PENDING  Incomplete  Culture, respiratory (NON-Expectorated)     Status: None   Collection Time: 01/13/18 12:28 AM  Result Value Ref Range Status   Specimen Description   Final    TRACHEAL ASPIRATE Performed at [facility], [address] [place], [place] [address]    Special Requests   Final    NONE Performed at [facility], [address] [place], [place] [address]    Gram Stain   Final    ABUNDANT WBC PRESENT, PREDOMINANTLY PMN NO ORGANISMS SEEN Performed at [facility], [place] [address] [place], [place] [address]    Culture FEW CANDIDA ALBICANS  Final   Report  Status 01/15/2018 FINAL  Final  Urine Culture     Status: None   Collection Time: 01/13/18  3:50 AM  Result Value Ref Range Status   Specimen Description  Final    URINE, RANDOM Performed at [facility], [address], [place], [place] [address]    Special Requests   Final    NONE Performed at [facility], [address] [place], [place] [address]    Culture   Final    NO GROWTH Performed at [facility], [place] [address] [place], [place] [address]    Report Status 01/14/2018 FINAL  Final  Culture, respiratory (NON-Expectorated)     Status: None (Preliminary result)   Collection Time: 01/14/18 11:07 AM  Result Value Ref Range Status   Specimen Description   Final    TRACHEAL ASPIRATE Performed at [facility], [address], [place], [place] [address]    Special Requests   Final    NONE Performed at [facility], [address] [place], [place] [address]    Gram Stain   Final    RARE WBC PRESENT, PREDOMINANTLY PMN NO ORGANISMS SEEN    Culture   Final    CULTURE REINCUBATED FOR BETTER GROWTH Performed at [facility], [place] [address] [place], [place] [address]    Report Status PENDING  Incomplete    Coagulation Studies: No results for input(s): LABPROT, INR in the last 72 hours.  Urinalysis: No results for input(s): COLORURINE, LABSPEC, PHURINE, GLUCOSEU, HGBUR, BILIRUBINUR, KETONESUR, PROTEINUR, UROBILINOGEN, NITRITE, LEUKOCYTESUR in the last 72 hours.  Invalid input(s): APPERANCEUR    Imaging: Dg Abd 1 View  Result Date: 01/15/2018 CLINICAL DATA:  Abdominal distention EXAM: ABDOMEN - 1 VIEW COMPARISON:  01/15/2017 FINDINGS: Examination is technically limited due to artifact from pads. Scattered gas-filled colon with mild distention of the cecum. This likely represents ileus. No small bowel distention is identified. No radiopaque stones. Surgical clips in the right upper quadrant. Enteric tube tip in the upper  abdomen consistent with location in the body of the stomach. IMPRESSION: Mild prominence of gas-filled colon likely indicating ileus. Electronically Signed   By: Lucienne Capers M.D.   On: 01/15/2018 21:07   Dg Abd 1 View  Result Date: 01/15/2018 CLINICAL DATA:  Vomiting. EXAM: ABDOMEN - 1 VIEW COMPARISON:  01/12/2018 FINDINGS: Artifact from pad limits evaluation. Abdomen is incompletely included within the field of view. There is scattered gas in the colon without abnormal small or large bowel distention. An enteric tube is present in the upper mid abdomen consistent with location in the body of the stomach. Tubing projected over the right femoral region may represent a femoral venous catheter. Degenerative changes in the spine. IMPRESSION: Nonobstructive bowel gas pattern. Enteric tube tip projects over the body of the stomach. Electronically Signed   By: Lucienne Capers M.D.   On: 01/15/2018 04:42   Dg Chest Port 1 View  Result Date: 01/16/2018 CLINICAL DATA:  Respiratory failure. EXAM: PORTABLE CHEST 1 VIEW COMPARISON:  01/15/2018. FINDINGS: Tracheostomy tube, NG tube, right IJ line in stable position. Persistent cardiomegaly. Persistent bilateral pulmonary infiltrates/edema and basilar atelectasis again noted. Slight worsening from prior exam. Right pleural effusion with slight progression from prior exam. Small left pleural effusion cannot be excluded. No pneumothorax noted. IMPRESSION: 1.  Lines and tubes stable position. 2. Cardiomegaly again noted. Bilateral pulmonary infiltrates/edema and bibasilar atelectasis again noted. Slight progression from prior exam. Right pleural effusion with slight progression from prior exam. Small left pleural effusion cannot be excluded on today's exam. Electronically Signed   By: [place]   On: 01/16/2018 06:57   Dg Chest Port 1  View  Result Date: 01/15/2018 CLINICAL DATA:  52 year old critically ill male with fever, septic shock with multi organ failure.  EXAM: PORTABLE CHEST 1 VIEW COMPARISON:  Portable chest 0408 hours today, and earlier. FINDINGS: Portable AP semi upright view at 1602 hours. Stable endotracheal tube tip in good position between the clavicles and carina. Enteric tube courses to the abdomen, tip not included. Stable right IJ central line. Stable cardiomegaly and mediastinal contours. Dense retrocardiac and lung base opacity with superimposed veiling opacity on the right is stable. No superimposed pneumothorax. Pulmonary vascularity is stable. Paucity of bowel gas in the upper abdomen. IMPRESSION: 1.  Stable lines and tubes. 2. Stable ventilation with bilateral lower lobe consolidation and right pleural effusion. Electronically Signed   By: Genevie Ann M.D.   On: 01/15/2018 16:30   Dg Chest Port 1 View  Result Date: 01/15/2018 CLINICAL DATA:  Acute respiratory failure EXAM: PORTABLE CHEST 1 VIEW COMPARISON:  Chest 01/13/2018 FINDINGS: Endotracheal tube with tip measuring 3.4 cm above the carina. Enteric tube tip is off the field of view but below the left hemidiaphragm. Right central venous catheter with tip over the low SVC region. Cardiac enlargement. Bilateral pleural effusions with basilar atelectasis. IMPRESSION: Appliances appear in satisfactory position. Cardiac enlargement. Bilateral pleural effusions with basilar atelectasis. Electronically Signed   By: Lucienne Capers M.D.   On: 01/15/2018 04:41     Medications:   . sodium chloride Stopped (01/11/18 1517)  . amiodarone 60 mg/hr (01/16/18 0140)  . anidulafungin Stopped (01/15/18 1921)  . anticoagulant sodium citrate    . fentaNYL infusion INTRAVENOUS 350 mcg/hr (01/16/18 0209)  . heparin 1,050 Units/hr (01/16/18 0140)  . linezolid (ZYVOX) IV 600 mg (01/16/18 0944)  . piperacillin-tazobactam (ZOSYN)  IV 3.375 g (01/16/18 0512)  . propofol (DIPRIVAN) infusion Stopped (01/14/18 0810)   . chlorhexidine gluconate (MEDLINE KIT)  15 mL Mouth Rinse BID  . Chlorhexidine Gluconate  Cloth  6 each Topical Q0600  . diltiazem  60 mg Oral Q6H  . sennosides  5 mL Per Tube BID   And  . docusate  100 mg Per Tube BID  . famotidine  20 mg Per Tube BID  . feeding supplement (PRO-STAT SUGAR FREE 64)  60 mL Per Tube 5 X Daily  . feeding supplement (VITAL HIGH PROTEIN)  1,000 mL Per Tube Q24H  . fentaNYL (SUBLIMAZE) injection  50 mcg Intravenous Once  . free water  250 mL Per Tube Q4H  . insulin aspart  0-15 Units Subcutaneous Q4H  . insulin aspart  5 Units Subcutaneous Q4H  . ipratropium-albuterol  3 mL Nebulization Q6H  . mouth rinse  15 mL Mouth Rinse 10 times per day  . metoCLOPramide (REGLAN) injection  5 mg Intravenous Q8H  . multivitamin  15 mL Per Tube Daily  . nutrition supplement (JUVEN)  1 packet Per Tube BID BM  . polyvinyl alcohol  1 drop Both Eyes TID  . sodium chloride flush  10-40 mL Intracatheter Q12H  . sodium chloride flush  3 mL Intravenous Q12H  . thiamine  100 mg Per Tube Daily   sodium chloride, acetaminophen **OR** acetaminophen, anticoagulant sodium citrate, bisacodyl, fentaNYL, iopamidol, ipratropium-albuterol, labetalol, midazolam, neomycin-bacitracin-polymyxin, sodium chloride flush, sodium chloride flush, vecuronium  Assessment/ Plan:  52 y.o. male with diabetes mellitus type 2, hypertension, chronic systolic heart failure ejection fraction 45%, who was admitted to [facility] on[facility]21/2019for evaluation of abdominal discomfort and bloating. Patient had decompensation on January 03, 2018 with severe hypotension requiring multiple  pressors, acute respiratory failure, shock liver and acute renal failure. Started CRRT from 6/22 to 6/28. Intermittent hemodialysis on 6/28. Trial of furosemide with good results on 6/29.   1. Acute renal failure secondary to severe hypotension,and also exposed to iv contrast with CTA. Baseline creatinine 1.14, 12/12/16 2. Generalized edema 3.  Acute respiratory failure.     Remains critically ill.  Urine output has decreased  significantly serum creatinine and BUN are worse likely from sepsis  Hold Lasix for now Maintain hemodynamic support and avoid hypotension BUN is critically elevated.  Will dialyze patient today We will continue to follow     LOS: [place] 7/5/201910:05 AM  [place], [place]  Note: This note was prepared with Dragon dictation. Any transcription errors are unintentional

## 2018-01-16 NOTE — Progress Notes (Signed)
[employer] - [place] at [facility]   PATIENT NAME: Ryan Webb    MR#:  [id number]  DATE OF BIRTH:  12/05/1965  SUBJECTIVE:   Patient remains critically ill.    on vent, febrile, tachycardic heart rate around 110s  REVIEW OF SYSTEMS:    unAble to obtain  tolerating Diet: Tube feeds DRUG ALLERGIES:   Allergies  Allergen Reactions  . No Known Allergies     VITALS:  Blood pressure 101/61, pulse (!) 117, temperature 99.2 F (37.3 C), temperature source Esophageal, resp. rate (!) 25, height 5\' 9"  (1.753 m), weight (!) 145.6 kg (320 lb 15.8 oz), SpO2 99 %.  PHYSICAL EXAMINATION:  Constitutional: Appears obese sedated on vent, critically ill HENT: Normocephalic.  Intubated Eyes: , no scleral icterus.  Neck: Normal ROM. Neck supple. No JVD. No tracheal deviation. CVS: Tachycardic S1/S2 +, no murmurs, no gallops, no carotid bruit.  Pulmonary: Effort and breath sounds normal, no stridor, rhonchi, wheezes, rales.  Abdominal: Soft. BS +,  no distension, tenderness, rebound or guarding.  Musculoskeletal: sedated Neuro:sedated     . Skin: Skin is warm and dry. No rash noted.  2+ lower extremity edema Psychiatric: sedated  LABORATORY PANEL:   CBC Recent Labs  Lab 01/16/18 0756  WBC 20.2*  HGB 10.3*  HCT 33.9*  PLT 343   ------------------------------------------------------------------------------------------------------------------  Chemistries  Recent Labs  Lab 01/16/18 0756  NA 146*  K 5.0  CL 103  CO2 25  GLUCOSE 249*  BUN 180*  CREATININE 3.36*  CALCIUM 8.0*  MG 3.4*   ------------------------------------------------------------------------------------------------------------------  Cardiac Enzymes No results for input(s): TROPONINI in the last 168 hours. ------------------------------------------------------------------------------------------------------------------  RADIOLOGY:  Dg Abd 1 View  Result Date: 01/15/2018 CLINICAL DATA:   Abdominal distention EXAM: ABDOMEN - 1 VIEW COMPARISON:  01/15/2017 FINDINGS: Examination is technically limited due to artifact from pads. Scattered gas-filled colon with mild distention of the cecum. This likely represents ileus. No small bowel distention is identified. No radiopaque stones. Surgical clips in the right upper quadrant. Enteric tube tip in the upper abdomen consistent with location in the body of the stomach. IMPRESSION: Mild prominence of gas-filled colon likely indicating ileus. Electronically Signed   By: Burman NievesWilliam  Stevens M.D.   On: 01/15/2018 21:07   Dg Abd 1 View  Result Date: 01/15/2018 CLINICAL DATA:  Vomiting. EXAM: ABDOMEN - 1 VIEW COMPARISON:  01/12/2018 FINDINGS: Artifact from pad limits evaluation. Abdomen is incompletely included within the field of view. There is scattered gas in the colon without abnormal small or large bowel distention. An enteric tube is present in the upper mid abdomen consistent with location in the body of the stomach. Tubing projected over the right femoral region may represent a femoral venous catheter. Degenerative changes in the spine. IMPRESSION: Nonobstructive bowel gas pattern. Enteric tube tip projects over the body of the stomach. Electronically Signed   By: Burman NievesWilliam  Stevens M.D.   On: 01/15/2018 04:42   Dg Chest Port 1 View  Result Date: 01/16/2018 CLINICAL DATA:  Respiratory failure. EXAM: PORTABLE CHEST 1 VIEW COMPARISON:  01/15/2018. FINDINGS: Tracheostomy tube, NG tube, right IJ line in stable position. Persistent cardiomegaly. Persistent bilateral pulmonary infiltrates/edema and basilar atelectasis again noted. Slight worsening from prior exam. Right pleural effusion with slight progression from prior exam. Small left pleural effusion cannot be excluded. No pneumothorax noted. IMPRESSION: 1.  Lines and tubes stable position. 2. Cardiomegaly again noted. Bilateral pulmonary infiltrates/edema and bibasilar atelectasis again noted. Slight  progression from prior  exam. Right pleural effusion with slight progression from prior exam. Small left pleural effusion cannot be excluded on today's exam. Electronically Signed   By: Maisie Fus  Register   On: 01/16/2018 06:57   Dg Chest Port 1 View  Result Date: 01/15/2018 CLINICAL DATA:  52 year old critically ill male with fever, septic shock with multi organ failure. EXAM: PORTABLE CHEST 1 VIEW COMPARISON:  Portable chest 0408 hours today, and earlier. FINDINGS: Portable AP semi upright view at 1602 hours. Stable endotracheal tube tip in good position between the clavicles and carina. Enteric tube courses to the abdomen, tip not included. Stable right IJ central line. Stable cardiomegaly and mediastinal contours. Dense retrocardiac and lung base opacity with superimposed veiling opacity on the right is stable. No superimposed pneumothorax. Pulmonary vascularity is stable. Paucity of bowel gas in the upper abdomen. IMPRESSION: 1.  Stable lines and tubes. 2. Stable ventilation with bilateral lower lobe consolidation and right pleural effusion. Electronically Signed   By: Odessa Fleming M.D.   On: 01/15/2018 16:30   Dg Chest Port 1 View  Result Date: 01/15/2018 CLINICAL DATA:  Acute respiratory failure EXAM: PORTABLE CHEST 1 VIEW COMPARISON:  Chest 01/13/2018 FINDINGS: Endotracheal tube with tip measuring 3.4 cm above the carina. Enteric tube tip is off the field of view but below the left hemidiaphragm. Right central venous catheter with tip over the low SVC region. Cardiac enlargement. Bilateral pleural effusions with basilar atelectasis. IMPRESSION: Appliances appear in satisfactory position. Cardiac enlargement. Bilateral pleural effusions with basilar atelectasis. Electronically Signed   By: Burman Nieves M.D.   On: 01/15/2018 04:41     ASSESSMENT AND PLAN:   52 year old male with obesity, diabetes, hypertension came in with tachyarrhythmia and was noted to be in septic shock  * Septic shock with  multiorgan failure.- PNA -Continue vent management as per ICU -Sputum culture obtained from tracheal aspirate is positive for gram-positive cocci in clusters and gram-negative coccobacilli 6/22 -IV zyvox, zosyn and eraxis empirically.  Follow-up on the cultures 6/29 -pt is s/o bronchoscopy with sputum cx pending -Previous blood cultures obtained on 01/03/2018 are negative.  Urine culture obtained on 01/03/2018 is also negative -MRSA PCR is negative   * Hypernatremia with hypokalemia D5W replete potassium  * Ectopic atrial tachycardia versus atrial flutter-remains tachycardic CT Angio of the chest and pelvis negative for any PE or aortic dissection. Echocardiogram with LV ejection fraction of 45% which is stable from prior. Platelet  count is better and patient is started on heparin drip.  Monitor platelet count closely Amiodarone drip -IV amiodarone gtt per cardiolgoy  * Acute renal failure with metabolic acidosis due to ATN from septic shock and contrast exposure due to CT scans.  Baseline creatinine 1.14 Patient was taking metformin and TCA as an outpatient, had hemodialysis  6/28.   Management per nephrology creatinine upto 3.36  *  Thrombocytopenia due to sepsis: Platelet count is increasing--back to normal Hit panel negative Last Platelets at 343,000  *Acute respiratory failure: Due to sepsis and multiorgan failure Remains on ventilator and management per ICU team  * Elevated LFTs due to shock liver with multiorgan failure due to sepsis.  LFTs are improving GI consultation appreciated  *  Nutrition: Continue tube feeds- vital Overall prognosis guarded.  Patient is critically ill.  Poor prognosis with high mortality and morbidity   CODE STATUS: FULL  TOTAL TIME TAKING CARE OF THIS PATIENT: 25 minutes.     POSSIBLE D/C ??, DEPENDING ON CLINICAL CONDITION.   Dawood Spitler  Telford Archambeau M.D on 01/16/2018 at 2:07 PM  Between 7am to 6pm - Pager - [phone] After 6pm go to [email or web address]  - password [email or web address]  [facility]  Office  [phone]  CC: Primary care physician; Anola Gurney, PA  Note: This dictation was prepared with Dragon dictation along with smaller phrase technology. Any transcriptional errors that result from this process are unintentional.

## 2018-01-16 NOTE — Progress Notes (Addendum)
Daily Progress Note   Patient Name: Ryan Webb       Date: 01/16/2018 DOB: 12-22-1965  Age: 52 y.o. MRN#: [id number] Attending Physician: Ryan Mandes, MD Primary Care Physician: Ryan Webb, [place] Admit Date: 12/24/2017  Reason for Consultation/Follow-up: Establishing goals of care  Subjective:  Patient is resting in bed on ventilator.  Dialysis resumed today. Met with family. Recapped conversation from yesterday regarding trach vs 1 way extubation, and the future. They would like to wait through the weekend and see how he does. They state he would not want to live in a facility, or on a ventilator. They would like to continue full code at this time, but will discuss over the weekend.   Will follow up with family on Tuesday.   Length of Stay: 13  Current Medications: Scheduled Meds:  . chlorhexidine gluconate (MEDLINE KIT)  15 mL Mouth Rinse BID  . Chlorhexidine Gluconate Cloth  6 each Topical Q0600  . diltiazem  60 mg Oral Q6H  . sennosides  5 mL Per Tube BID   And  . docusate  100 mg Per Tube BID  . famotidine  20 mg Per Tube BID  . feeding supplement (PRO-STAT SUGAR FREE 64)  60 mL Per Tube 5 X Daily  . feeding supplement (VITAL HIGH PROTEIN)  1,000 mL Per Tube Q24H  . fentaNYL (SUBLIMAZE) injection  50 mcg Intravenous Once  . free water  250 mL Per Tube Q4H  . insulin aspart  0-15 Units Subcutaneous Q4H  . insulin aspart  5 Units Subcutaneous Q4H  . ipratropium-albuterol  3 mL Nebulization Q6H  . mouth rinse  15 mL Mouth Rinse 10 times per day  . metoCLOPramide (REGLAN) injection  5 mg Intravenous Q8H  . multivitamin  15 mL Per Tube Daily  . nutrition supplement (JUVEN)  1 packet Per Tube BID BM  . polyvinyl alcohol  1 drop Both Eyes TID  . sodium chloride flush  10-40 mL  Intracatheter Q12H  . sodium chloride flush  3 mL Intravenous Q12H  . thiamine  100 mg Per Tube Daily    Continuous Infusions: . sodium chloride Stopped (01/11/18 1517)  . amiodarone 60 mg/hr (01/16/18 0140)  . anidulafungin Stopped (01/15/18 1921)  . anticoagulant sodium citrate    . fentaNYL infusion INTRAVENOUS 350 mcg/hr (  01/16/18 0209)  . heparin 1,050 Units/hr (01/16/18 0140)  . linezolid (ZYVOX) IV 600 mg (01/16/18 0944)  . piperacillin-tazobactam (ZOSYN)  IV 3.375 g (01/16/18 1514)  . propofol (DIPRIVAN) infusion Stopped (01/14/18 0810)    PRN Meds: sodium chloride, acetaminophen **OR** acetaminophen, anticoagulant sodium citrate, bisacodyl, fentaNYL, iopamidol, ipratropium-albuterol, labetalol, midazolam, neomycin-bacitracin-polymyxin, sodium chloride flush, sodium chloride flush, vecuronium  Physical Exam  Constitutional: No distress.  Pulmonary/Chest: Effort normal.  Neurological: He is alert.  Skin: Skin is warm and dry.            Vital Signs: BP 101/66   Pulse (!) 117   Temp 99.2 F (37.3 C) (Esophageal)   Resp (!) 26   Ht 5' 9"  (1.753 m)   Wt (!) 145.6 kg (320 lb 15.8 oz)   SpO2 99%   BMI 47.40 kg/m  SpO2: SpO2: 99 % O2 Device: O2 Device: Ventilator O2 Flow Rate:    Intake/output summary:   Intake/Output Summary (Last 24 hours) at 01/16/2018 1541 Last data filed at 01/16/2018 0520 Gross per 24 hour  Intake 1010.21 ml  Output 765 ml  Net 245.21 ml   LBM: Last BM Date: 01/14/18 Baseline Weight: Weight: 122.5 kg (270 lb) Most recent weight: Weight: (!) 145.6 kg (320 lb 15.8 oz)       Palliative Assessment/Data: 10%      Patient Active Problem List   Diagnosis Date Noted  . Pressure injury of skin 01/10/2018  . Transaminitis   . Acute respiratory failure ([place]) 01/03/2018  . Respiratory failure ([place])   . Shock ([place])   . Tachyarrhythmia 12/21/2017  . Diabetes mellitus without complication ([place]) 58/85/0277  . Cardiomyopathy ([place]) 01/17/2016    . Coronary artery disease 01/17/2016  . Breathlessness on exertion 01/17/2016  . Acid reflux 01/17/2016  . H/O: HTN (hypertension) 01/17/2016  . 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 Prostatitis 01/17/2016    Palliative Care Assessment & Plan   Patient Profile: Ryan Rhinesmith Woodsis an 52 y.o.malewithpast medical history of hypertension, diabetes, tachycardia,admitted initiallyon 6/21for abdominal pain, noted to have severe acidosis, possibly related to metformin use,respiratory failure, renal failure, now with prolonged mechanical ventilation.  Assessment/ Recommendations/Plan:  Plan to see how he does through the weekend. Leaning toward 1 way extubation instead of trach next week.   Code Status:    Code Status Orders  (From admission, onward)        Start     Ordered   01/07/2018 1742  Full code  Continuous     12/24/2017 1742    Code Status History    This patient has a current code status but no historical code status.       Prognosis:   Unable to determine  Discharge Planning:  To Be Determined    Thank you for allowing the Palliative Medicine Team to assist in the care of this patient.   Total Time 35 min Prolonged Time Billed  NO      Greater than 50%  of this time was spent counseling and coordinating care related to the above assessment and plan.  Ryan Gowda, NP  Please contact Palliative Medicine Team phone at [phone] for questions and concerns.

## 2018-01-16 NOTE — Progress Notes (Signed)
ANTICOAGULATION CONSULT NOTE - Follow Up Consult  Pharmacy Consult for Heparin Drip Indication: atrial fibrillation  Allergies  Allergen Reactions  . No Known Allergies     Patient Measurements: Height: 5\' 9"  (175.3 cm) Weight: (!) 320 lb 15.8 oz (145.6 kg) IBW/kg (Calculated) : 70.7 Heparin Dosing Weight: 98.9 kg  Vital Signs: BP: 102/60 (07/05 1600)  Labs: Recent Labs    01/14/18 0205 01/14/18 0430  01/14/18 1042  01/15/18 0440 01/15/18 1103 01/16/18 0756  HGB 10.3*  --   --   --   --  10.3*  --  10.3*  HCT 32.2*  --   --   --   --  32.7*  --  33.9*  PLT 278  --   --   --   --  300  --  343  HEPARINUNFRC 0.84*  --    < >  --    < > 0.58 0.55 0.34  CREATININE  --  2.30*  --   --   --  2.43*  --  3.36*  CKTOTAL  --   --   --  55  --   --   --   --    < > = values in this interval not displayed.    Estimated Creatinine Clearance: 37 mL/min (A) (by C-G formula based on SCr of 3.36 mg/dL (H)).  Pharmacy consulted for heparin drip management for 52 yo male admitted with tachyarrhythmias.   Goal of Therapy:  Heparin level 0.3-0.7 units/ml Monitor platelets by anticoagulation protocol: Yes   Assessment/Plan:  Will continue at 1050 units/hr and recheck heparin level with am labs on 7/6.   Pharmacy will continue to monitor and adjust per consult.   MLS /01/16/2018

## 2018-01-16 NOTE — Progress Notes (Signed)
This note also relates to the following rows which could not be included: Resp - Cannot attach notes to unvalidated device data BP - Cannot attach notes to unvalidated device data End Tidal CO2 (EtCO2) - Cannot attach notes to unvalidated device data  Hd stqrted

## 2018-01-16 NOTE — Consult Note (Signed)
Pharmacy Antibiotic Note  Lucas Mallowony W Salvi is a 52 y.o. male admitted on 11/18/2017 with pneumonia and sepsis.  Pharmacy has been consulted for antibiotic dosing.Patient with renal failure, was on CRRT now started on intermittent HD. Patient to receive dialysis on 7/5.   Plan:  Per ICU rounds on 7/5, will transition patient to fluconazole 200mg  IV Q24hr - starting with dose on 7/6. Will discontinue linezolid and Zosyn.    Height: 5\' 9"  (175.3 cm) Weight: (!) 320 lb 15.8 oz (145.6 kg) IBW/kg (Calculated) : 70.7  Temp (24hrs), Avg:99.4 F (37.4 C), Min:99.2 F (37.3 C), Max:99.6 F (37.6 C)  Recent Labs  Lab 01/11/18 0428  01/13/18 0350 01/13/18 0942 01/14/18 0205 01/14/18 0430 01/15/18 0440 01/16/18 0756  WBC 9.7  --  7.5  --  9.9  --  15.3* 20.2*  CREATININE 2.49*   < > 1.99* 1.85*  --  2.30* 2.43* 3.36*  VANCORANDOM  --   --  5  --   --  11  --   --    < > = values in this interval not displayed.    Estimated Creatinine Clearance: 37 mL/min (A) (by C-G formula based on SCr of 3.36 mg/dL (H)).    Allergies  Allergen Reactions  . No Known Allergies     Antimicrobials this admission: Zosyn 6/23 >> 7/5 vancomycin 6/29 >> 7/3 linezolid 7/3 >> 7/5 anidulafungin 7/3 >> 7/5  Fluconazole 7/5 >>   Dose adjustments this admission:   Microbiology results: 7/3 TA: few candida albicans  7/2 TA: few candida albicans  7/2 UCx: no growth  7/1 BCx: no growth x 4 days  6/28 TA: moderate Candida albicans 6/22 BCx: no growth x 5 das  6/22 UCx: no growth  6/22 Sputum: normal respiratory flora  6/22 MRSA PCR: neg  Thank you for allowing pharmacy to be a part of this patient's care.  Nathan A. Dahlia Bailiffookson, PharmD Clinical Pharmacist  01/16/2018

## 2018-01-16 NOTE — Progress Notes (Signed)
CRITICAL CARE NOTE  CC  follow up respiratory failure  SUBJECTIVE Patient remains critically ill Prognosis is guarded Remains on vent Palliative care team consulted It seems wife would NOT want to pursue Trach       SIGNIFICANT EVENTS  STUDIES: 6/22 ECHO-EF 45-50%  CULTURES: MRSA PCR- Neg  ANTIBIOTICS: 6/22Zosyn; Vancomycin 6/24 Vancomycin D/C  SIGNIFICANT EVENTS: 6/21 admission to hospital 6/22 transfer to ICU 7/1 ETT changed out   LINES/TUBES: 6/22 Foley 6/22 R-IJ TLC, R- Femoral hemodialysis cath 6/22 L- Femoral Arterial line 6/22 Endotracheal tube 6/26 failed wean off paralytics 7/1 ETT changed due to damage     BP 102/68   Pulse (!) 117   Temp 99.2 F (37.3 C) (Esophageal)   Resp (!) 25   Ht 5\' 9"  (1.753 m)   Wt (!) 320 lb 15.8 oz (145.6 kg)   SpO2 99%   BMI 47.40 kg/m    REVIEW OF SYSTEMS  PATIENT IS UNABLE TO PROVIDE COMPLETE REVIEW OF SYSTEM S DUE TO SEVERE CRITICAL ILLNESS AND ENCEPHALOPATHY   PHYSICAL EXAMINATION:  GENERAL:critically ill appearing, +resp distress HEAD: Normocephalic, atraumatic.  EYES: Pupils equal, round, reactive to light.  No scleral icterus.  MOUTH: Moist mucosal membrane. NECK: Supple. No thyromegaly. No nodules. No JVD.  PULMONARY: +rhonchi, +wheezing CARDIOVASCULAR: S1 and S2. Regular rate and rhythm. No murmurs, rubs, or gallops.  GASTROINTESTINAL: Soft, nontender, -distended. No masses. Positive bowel sounds. No hepatosplenomegaly.  MUSCULOSKELETAL: No swelling, clubbing, or edema.  NEUROLOGIC: obtunded, GCS<8 SKIN:intact,warm,dry  ASSESSMENT AND PLAN 52 yo morbidly obese white male with progressive resp failure with severe septic shock from probable acute viral syndrome in setting of metformin toxicity and sever acidosis, complicated by acute renal failure with difficulty weaning from vent   Severe Hypoxic and Hypercapnic Respiratory Failure -restarted ABX  -continue Full MV  support -continue Bronchodilator Therapy -Wean Fio2 and PEEP as tolerated   Renal Failure-most likely due to ATN -follow chem 7 -follow UO -continue Foley Catheter-assess need IHD as per nephrology   NEUROLOGY - intubated and sedated - minimal sedation to achieve a RASS goal: -1   Septic shock -use vasopressors to keep MAP>65 as needed -follow up cultures -emperic ABX   CARDIAC ICU monitoring  ID -continue IV abx as prescibed -follow up cultures   DVT/GI PRX ordered TRANSFUSIONS AS NEEDED MONITOR FSBS ASSESS the need for LABS as needed   Critical Care Time devoted to patient care services described in this note is 45 minutes.   Overall, patient is critically ill, prognosis is guarded.  Patient with Multiorgan failure and at high risk for cardiac arrest and death.   I anticipate prolonged ICU LOS and anticipate trach and PEG tube for survival It seems that family does NOT wang to pursue trach at this time    Lucie LeatherKurian David Marley Charlot, M.D.  [facility]  Medical Director [facility] Medical Director [facility]

## 2018-01-16 NOTE — Consult Note (Addendum)
                                                                                 Consultation Note Date: 01/16/2018   Patient Name: Ryan Webb  DOB: 07/15/1965  MRN: [id number]  Age / Sex: 52 y.o., male  PCP: Chauvin, Robert, PA Referring Physician: Patel, Sona, MD  Reason for Consultation: Establishing goals of care  HPI/Patient Profile: Ryan Webb is an 52 y.o. male with past medical history of hypertension, diabetes, tachycardia,admitted initially on 6/21 for abdominal pain, noted to have severe acidosis, possibly related to metformin use,respiratory failure, renal failure, now with prolonged mechanical ventilation.    Clinical Assessment and Goals of Care: Patient is resting in bed on ventilator. Wife Michelle is at bedside. They are married and have children. She states a couple of months ago, he developed shortness of breath. Two weeks ago, he developed orthopnea, and began sleeping in a recliner, and noticed orthopnea when using the recliner as well. He has had a good appetite. No assistive devices.  We discussed his diagnoses, prognosis, and GOC.  She states he would never want to live in a vegetative state, or a state where he is bed bound. We discussed tracheostomy with ventilator, and what that could like after discharge, as well as one way extubation and comfort. We discussed the medications being used currently to keep him comfortable on the ventilator.  She states she will begin to think about her options.         SUMMARY OF RECOMMENDATIONS    Wife considering options moving forward.    Code Status/Advance Care Planning:  Full code    Symptom Management:   Per primary team.  Palliative Prophylaxis:   Eye Care and Oral Care   Prognosis:   Poor overall.   Discharge Planning: To Be Determined      Primary Diagnoses: Present on Admission: . Tachyarrhythmia . Acute respiratory failure (HCC)   I have reviewed the medical record, interviewed the  patient and family, and examined the patient. The following aspects are pertinent.  Past Medical History:  Diagnosis Date  . Diabetes mellitus without complication (HCC)   . Hypertension    Social History   Socioeconomic History  . Marital status: Married    Spouse name: Not on file  . Number of children: Not on file  . Years of education: Not on file  . Highest education level: Not on file  Occupational History  . Not on file  Social Needs  . Financial resource strain: Not on file  . Food insecurity:    Worry: Not on file    Inability: Not on file  . Transportation needs:    Medical: Not on file    Non-medical: Not on file  Tobacco Use  . Smoking status: Never Smoker  . Smokeless tobacco: Never Used  Substance and Sexual Activity  . Alcohol use: No  . Drug use: No  . Sexual activity: Yes  Lifestyle  . Physical activity:    Days per week: Not on file    Minutes per session: Not on file  . Stress: Not on file  Relationships  .   Social connections:    Talks on phone: Not on file    Gets together: Not on file    Attends religious service: Not on file    Active member of club or organization: Not on file    Attends meetings of clubs or organizations: Not on file    Relationship status: Not on file  Other Topics Concern  . Not on file  Social History Narrative  . Not on file   Family History  Problem Relation Age of Onset  . COPD Father   . Diabetes Father   . Diabetes Mother    Scheduled Meds: . chlorhexidine gluconate (MEDLINE KIT)  15 mL Mouth Rinse BID  . Chlorhexidine Gluconate Cloth  6 each Topical Q0600  . diltiazem  60 mg Oral Q6H  . sennosides  5 mL Per Tube BID   And  . docusate  100 mg Per Tube BID  . famotidine  20 mg Per Tube BID  . feeding supplement (PRO-STAT SUGAR FREE 64)  60 mL Per Tube 5 X Daily  . feeding supplement (VITAL HIGH PROTEIN)  1,000 mL Per Tube Q24H  . fentaNYL (SUBLIMAZE) injection  50 mcg Intravenous Once  . free water  250  mL Per Tube Q4H  . insulin aspart  0-15 Units Subcutaneous Q4H  . insulin aspart  5 Units Subcutaneous Q4H  . ipratropium-albuterol  3 mL Nebulization Q6H  . mouth rinse  15 mL Mouth Rinse 10 times per day  . metoCLOPramide (REGLAN) injection  5 mg Intravenous Q8H  . multivitamin  15 mL Per Tube Daily  . nutrition supplement (JUVEN)  1 packet Per Tube BID BM  . polyvinyl alcohol  1 drop Both Eyes TID  . sodium chloride flush  10-40 mL Intracatheter Q12H  . sodium chloride flush  3 mL Intravenous Q12H  . thiamine  100 mg Per Tube Daily   Continuous Infusions: . sodium chloride Stopped (01/11/18 1517)  . amiodarone 60 mg/hr (01/16/18 0140)  . anidulafungin Stopped (01/15/18 1921)  . anticoagulant sodium citrate    . fentaNYL infusion INTRAVENOUS 350 mcg/hr (01/16/18 0209)  . heparin 1,050 Units/hr (01/16/18 0140)  . linezolid (ZYVOX) IV Stopped (01/15/18 2208)  . piperacillin-tazobactam (ZOSYN)  IV 3.375 g (01/16/18 0512)  . propofol (DIPRIVAN) infusion Stopped (01/14/18 0810)   PRN Meds:.sodium chloride, acetaminophen **OR** acetaminophen, anticoagulant sodium citrate, bisacodyl, fentaNYL, iopamidol, ipratropium-albuterol, labetalol, midazolam, neomycin-bacitracin-polymyxin, sodium chloride flush, sodium chloride flush, vecuronium Medications Prior to Admission:  Prior to Admission medications   Medication Sig Start Date End Date Taking? Authorizing Provider  aspirin EC 81 MG tablet Take 81 mg by mouth daily.   Yes [provider]  atorvastatin (LIPITOR) 80 MG tablet Take 1 tablet (80 mg total) by mouth daily. 09/30/17  Yes Chauvin, Robert, PA  carvedilol (COREG) 3.125 MG tablet Take 3.125 mg by mouth 2 (two) times daily with a meal.  11/19/16  Yes [provider]  clomiPRAMINE (ANAFRANIL) 50 MG capsule TAKE 2 CAPSULES BY MOUTH AT BEDTIME 12/09/17  Yes Chauvin, Robert, PA  furosemide (LASIX) 20 MG tablet Take 20 mg by mouth daily.  11/19/16  Yes [provider]    lansoprazole (PREVACID) 30 MG capsule Take 1 capsule (30 mg total) by mouth daily at 12 noon. 12/19/17  Yes Chauvin, Robert, PA  lisinopril (PRINIVIL,ZESTRIL) 2.5 MG tablet Take 2.5 mg by mouth daily.  11/19/16  Yes [provider]  metFORMIN (GLUCOPHAGE) 1000 MG tablet Take 1 tablet (1,000 mg   total) by mouth 2 (two) times daily with a meal. 12/19/17  Yes Chauvin, Robert, PA  metoprolol tartrate (LOPRESSOR) 25 MG tablet Take 25 mg by mouth 2 (two) times daily. 12/23/17  Yes [provider]  sucralfate (CARAFATE) 1 g tablet Take 1 tablet (1 g total) by mouth 4 (four) times daily -  with meals and at bedtime. 12/19/17  Yes Chauvin, Robert, PA  verapamil (CALAN) 40 MG tablet Take 40 mg by mouth 2 (two) times daily. 12/25/17  Yes [provider]  glucose blood test strip Check sugar twice daily 01/23/17   Chauvin, Robert, PA   Allergies  Allergen Reactions  . No Known Allergies    Review of Systems  All other systems reviewed and are negative.   Physical Exam  Constitutional: No distress.  Pulmonary/Chest:  Ventilator    Vital Signs: BP 102/68   Pulse (!) 117   Temp 99.2 F (37.3 C) (Esophageal)   Resp (!) 25   Ht 5' 9" (1.753 m)   Wt (!) 145.6 kg (320 lb 15.8 oz)   SpO2 100%   BMI 47.40 kg/m  Pain Scale: CPOT POSS *See Group Information*: (pt on nimbex drip) Pain Score: 0-No pain   SpO2: SpO2: 100 % O2 Device:SpO2: 100 % O2 Flow Rate: .   IO: Intake/output summary:   Intake/Output Summary (Last 24 hours) at 01/16/2018 0752 Last data filed at 01/16/2018 0520 Gross per 24 hour  Intake 1758.91 ml  Output 1135 ml  Net 623.91 ml    LBM: Last BM Date: 01/14/18 Baseline Weight: Weight: 122.5 kg (270 lb) Most recent weight: Weight: (!) 145.6 kg (320 lb 15.8 oz)     Palliative Assessment/Data: 10%     Time In: 3:50 Time Out: 5:00 Time Total: 70 min Greater than 50%  of this time was spent counseling and coordinating care related to the above assessment  and plan.  Signed by:  , NP   Please contact Palliative Medicine Team phone at [phone] for questions and concerns.  For individual provider: See Amion             

## 2018-01-17 ENCOUNTER — Inpatient Hospital Stay: Payer: BLUE CROSS/BLUE SHIELD

## 2018-01-17 LAB — CBC
HEMATOCRIT: 31.6 % — AB (ref 40.0–52.0)
Hemoglobin: 10.1 g/dL — ABNORMAL LOW (ref 13.0–18.0)
MCH: 33.4 pg (ref 26.0–34.0)
MCHC: 32.1 g/dL (ref 32.0–36.0)
MCV: 104 fL — ABNORMAL HIGH (ref 80.0–100.0)
PLATELETS: 339 10*3/uL (ref 150–440)
RBC: 3.03 MIL/uL — ABNORMAL LOW (ref 4.40–5.90)
RDW: 19.2 % — AB (ref 11.5–14.5)
WBC: 24.5 10*3/uL — AB (ref 3.8–10.6)

## 2018-01-17 LAB — OPIATES,MS,WB/SP RFX
6-ACETYLMORPHINE: NEGATIVE
CODEINE: NEGATIVE ng/mL
Dihydrocodeine: NEGATIVE ng/mL
Hydrocodone: NEGATIVE ng/mL
Hydromorphone: NEGATIVE ng/mL
MORPHINE: NEGATIVE ng/mL
OPIATE CONFIRMATION: NEGATIVE

## 2018-01-17 LAB — RENAL FUNCTION PANEL
ALBUMIN: 2.9 g/dL — AB (ref 3.5–5.0)
ANION GAP: 14 (ref 5–15)
BUN: 176 mg/dL — ABNORMAL HIGH (ref 6–20)
CALCIUM: 7.7 mg/dL — AB (ref 8.9–10.3)
CO2: 24 mmol/L (ref 22–32)
Chloride: 103 mmol/L (ref 98–111)
Creatinine, Ser: 4.05 mg/dL — ABNORMAL HIGH (ref 0.61–1.24)
GFR, EST AFRICAN AMERICAN: 18 mL/min — AB (ref >60)
GFR, EST NON AFRICAN AMERICAN: 16 mL/min — AB (ref >60)
GLUCOSE: 206 mg/dL — AB (ref 70–99)
PHOSPHORUS: 5.2 mg/dL — AB (ref 2.5–4.6)
POTASSIUM: 4.7 mmol/L (ref 3.5–5.1)
SODIUM: 141 mmol/L (ref 135–145)

## 2018-01-17 LAB — BLOOD GAS, ARTERIAL
Acid-base deficit: 4 mmol/L — ABNORMAL HIGH (ref 0.0–2.0)
Bicarbonate: 22.6 mmol/L (ref 20.0–28.0)
FIO2: 1
MECHVT: 500 mL
O2 SAT: 99.9 %
PEEP/CPAP: 10 cmH2O
PH ART: 7.29 — AB (ref 7.350–7.450)
Patient temperature: 37
RATE: 25 resp/min
pCO2 arterial: 47 mmHg (ref 32.0–48.0)
pO2, Arterial: 340 mmHg — ABNORMAL HIGH (ref 83.0–108.0)

## 2018-01-17 LAB — DRUG SCREEN 10 W/CONF, SERUM
AMPHETAMINES, IA: NEGATIVE ng/mL
BENZODIAZEPINES, IA: POSITIVE ng/mL
Barbiturates, IA: NEGATIVE ug/mL
COCAINE & METABOLITE, IA: NEGATIVE ng/mL
Methadone, IA: NEGATIVE ng/mL
Opiates, IA: NEGATIVE ng/mL
Oxycodones, IA: NEGATIVE ng/mL
PROPOXYPHENE, IA: NEGATIVE ng/mL
Phencyclidine, IA: NEGATIVE ng/mL
THC(Marijuana) Metabolite, IA: NEGATIVE ng/mL

## 2018-01-17 LAB — CULTURE, BLOOD (ROUTINE X 2)
CULTURE: NO GROWTH
Culture: NO GROWTH
SPECIAL REQUESTS: ADEQUATE

## 2018-01-17 LAB — GLUCOSE, CAPILLARY
GLUCOSE-CAPILLARY: 190 mg/dL — AB (ref 70–99)
GLUCOSE-CAPILLARY: 141 mg/dL — AB (ref 70–99)
GLUCOSE-CAPILLARY: 158 mg/dL — AB (ref 70–99)
GLUCOSE-CAPILLARY: 170 mg/dL — AB (ref 70–99)
Glucose-Capillary: 160 mg/dL — ABNORMAL HIGH (ref 70–99)

## 2018-01-17 LAB — MAGNESIUM: MAGNESIUM: 3.1 mg/dL — AB (ref 1.7–2.4)

## 2018-01-17 LAB — HEPARIN LEVEL (UNFRACTIONATED): HEPARIN UNFRACTIONATED: 0.3 [IU]/mL (ref 0.30–0.70)

## 2018-01-17 MED ORDER — STERILE WATER FOR INJECTION IJ SOLN
INTRAMUSCULAR | Status: AC
Start: 2018-01-17 — End: 2018-01-17
  Administered 2018-01-17: 23:00:00
  Filled 2018-01-17: qty 10

## 2018-01-17 NOTE — Progress Notes (Signed)
CRITICAL CARE NOTE  CC  follow up respiratory failure  SUBJECTIVE Patient remains critically ill Prognosis is guarded S/p HD last night abd distended TF's on hold Failure to wean from vent     SIGNIFICANT EVENTS STUDIES: 6/22 ECHO-EF 45-50%  CULTURES: MRSA PCR- Neg  ANTIBIOTICS: 6/22Zosyn; Vancomycin 6/24 Vancomycin D/C  SIGNIFICANT EVENTS: 6/21 admission to hospital 6/22 transfer to ICU 7/1 ETT changed out   LINES/TUBES: 6/22 Foley 6/22 R-IJ TLC, R- Femoral hemodialysis cath 6/22 L- Femoral Arterial line 6/22 Endotracheal tube 6/26 failed wean off paralytics 7/1 ETT changed due to damage      BP (!) 101/59   Pulse (!) 111   Temp 99 F (37.2 C)   Resp (!) 25   Ht 5\' 9"  (1.753 m)   Wt (!) 320 lb 15.8 oz (145.6 kg)   SpO2 99%   BMI 47.40 kg/m    REVIEW OF SYSTEMS  PATIENT IS UNABLE TO PROVIDE COMPLETE REVIEW OF SYSTEM S DUE TO SEVERE CRITICAL ILLNESS AND ENCEPHALOPATHY   PHYSICAL EXAMINATION:  GENERAL:critically ill appearing, +resp distress HEAD: Normocephalic, atraumatic.  EYES: Pupils equal, round, reactive to light.  No scleral icterus.  MOUTH: Moist mucosal membrane. NECK: Supple. No thyromegaly. No nodules. No JVD.  PULMONARY: +rhonchi, +wheezing CARDIOVASCULAR: S1 and S2. Regular rate and rhythm. No murmurs, rubs, or gallops.  GASTROINTESTINAL: Soft, nontender, -distended. No masses. Positive bowel sounds. No hepatosplenomegaly.  MUSCULOSKELETAL: No swelling, clubbing, or edema.  NEUROLOGIC: obtunded, GCS<8 SKIN:intact,warm,dry  ASSESSMENT AND PLAN 52 yo morbidly obese white male with progressive resp failure with severe septic shock from probable acute viral syndrome in setting of metformin toxicity and sever acidosis, complicated by acute renal failure with difficulty weaning from vent complicated by ileus     Severe Hypoxic and Hypercapnic Respiratory Failure -continue Full MV support -continue Bronchodilator  Therapy -Wean Fio2 and PEEP as tolerated Unable to wean from vent   Renal Failure-most likely due to ATN -follow chem 7 -follow UO -continue Foley Catheter-assess need HD as per nephrology   NEUROLOGY - intubated and sedated - minimal sedation to achieve a RASS goal: -1    CARDIAC ICU monitoring  ID -continue IV abx as prescibed -follow up cultures  GI  Ileus NG to suction Hold Tf's  DVT/GI PRX ordered TRANSFUSIONS AS NEEDED MONITOR FSBS ASSESS the need for LABS as needed   Critical Care Time devoted to patient care services described in this note is 34 minutes.   Overall, patient is critically ill, prognosis is guarded.  Patient with Multiorgan failure and at high risk for cardiac arrest and death.    I would recommened Trach and PEG tube, however, the wife has stated that patient would NOT want Trach and long term care. SHe believes his quality of life is more important and he would NOT want to live on machines even if it was 6 months.   Palliative care following  Lucie LeatherKurian David Kaspar Albornoz, M.D.  [facility]  Medical Director [facility] Medical Director [facility]

## 2018-01-17 NOTE — Progress Notes (Signed)
Post dialysis assessment 

## 2018-01-17 NOTE — Progress Notes (Signed)
Pre dialysis assessment 

## 2018-01-17 NOTE — Progress Notes (Signed)
ANTICOAGULATION CONSULT NOTE - Follow Up Consult  Pharmacy Consult for Heparin Drip Indication: atrial fibrillation  Allergies  Allergen Reactions  . No Known Allergies     Patient Measurements: Height: 5\' 9"  (175.3 cm) Weight: (!) 320 lb 15.8 oz (145.6 kg) IBW/kg (Calculated) : 70.7 Heparin Dosing Weight: 98.9 kg  Vital Signs: Temp: 99 F (37.2 C) (07/06 0400) Temp Source: Esophageal (07/06 0000) BP: 101/59 (07/06 0600) Pulse Rate: 111 (07/05 2300)  Labs: Recent Labs    01/14/18 1042  01/15/18 0440 01/15/18 1103 01/16/18 0756 01/17/18 0439 01/17/18 0833  HGB  --    < > 10.3*  --  10.3* 10.1*  --   HCT  --   --  32.7*  --  33.9* 31.6*  --   PLT  --   --  300  --  343 339  --   HEPARINUNFRC  --    < > 0.58 0.55 0.34  --  0.30  CREATININE  --   --  2.43*  --  3.36* 4.05*  --   CKTOTAL 55  --   --   --   --   --   --    < > = values in this interval not displayed.    Estimated Creatinine Clearance: 30.7 mL/min (A) (by C-G formula based on SCr of 4.05 mg/dL (H)).  Pharmacy consulted for heparin drip management for 52 yo male admitted with tachyarrhythmias.  7/6 HL = 0.3.  Goal of Therapy:  Heparin level 0.3-0.7 units/ml Monitor platelets by anticoagulation protocol: Yes   Assessment/Plan:  Will continue at 1050 units/hr and recheck heparin level with am labs on 7/7.   Pharmacy will continue to monitor and adjust per consult.   Stormy CardKatsoudas,Kamree Wiens K, [facility]PH Clinical Pharmacist 01/17/2018, 9:43 AM

## 2018-01-17 NOTE — Progress Notes (Signed)
Dialysis treatment started. 

## 2018-01-17 NOTE — Progress Notes (Signed)
   01/17/18 1155  Clinical Encounter Type  Visited With Family  Visit Type Follow-up  Spiritual Encounters  Spiritual Needs Emotional   Chaplain checked in with patient's spouse, mother, and extended family in waiting room to offer emotional support.  Family engaged in conversation; chaplain will monitor and encouraged spouse to have chaplain paged as needed.

## 2018-01-17 NOTE — Progress Notes (Signed)
[facility], [place] 01/17/18  Subjective:   Critically ill, Sedated, vent support FiO2 35%, tube feeds on hold,  patient is continued on amiodarone, fentanyl, heparin infusions.   He remains tachycardic with heart rate in the low 100's.    Urine output is lower now recorded at 350 cc last 24 hours BUN/Creatinine remain critically high Tolerated short intermittent HD yesterday     Objective:  Vital signs in last 24 hours:  Temp:  [98.4 F (36.9 C)-99.1 F (37.3 C)] 99 F (37.2 C) (07/06 0400) Pulse Rate:  [96-114] 111 (07/05 2300) Resp:  [22-28] 25 (07/06 0600) BP: (93-117)/(53-87) 101/59 (07/06 0600) SpO2:  [95 %-99 %] 99 % (07/06 0727) FiO2 (%):  [35 %] 35 % (07/06 0727) Weight:  [145.6 kg (320 lb 15.8 oz)] 145.6 kg (320 lb 15.8 oz) (07/06 0317)  Weight change: 0 kg (0 lb) Filed Weights   01/15/18 0438 01/16/18 0500 01/17/18 0317  Weight: (!) 145.1 kg (319 lb 14.2 oz) (!) 145.6 kg (320 lb 15.8 oz) (!) 145.6 kg (320 lb 15.8 oz)    Intake/Output:    Intake/Output Summary (Last 24 hours) at 01/17/2018 0904 Last data filed at 01/17/2018 0515 Gross per 24 hour  Intake 1555.12 ml  Output 850 ml  Net 705.12 ml     Physical Exam: General:  Critically ill-appearing, laying in the bed  HEENT   ET tube in place  Pulm/lungs  ventilator assisted,    CVS/Heart  sinus tachycardia   Abdomen:   distended, scrotal edema, scant bowel sounds  Extremities:  2-3+ generalized pitting edema  Neurologic:  Sedated  Skin:  mottling rt leg and rt arm  Access: Rt femoral vascath   Foley catheter in place    Basic Metabolic Panel:  Recent Labs  Lab 01/13/18 0350 01/13/18 0942 01/14/18 0205 01/14/18 0430 01/15/18 0440 01/16/18 0756 01/17/18 0439  NA 153* 154*  --  148* 148* 146* 141  K 2.3* 2.9*  --  3.8 4.1 5.0 4.7  CL 112* 115*  --  112* 110 103 103  CO2 32 28  --  _0 GLUCOSE 162* 219*  --  215* 257* 249* 206*  BUN 91* 90*  --  119*  139* 180* 176*  CREATININE 1.99* 1.85*  --  2.30* 2.43* 3.36* 4.05*  CALCIUM 8.2* 8.2*  --  8.0* 8.1* 8.0* 7.7*  MG 2.4  --  2.4  --  2.5* 3.4* 3.1*  PHOS 3.6  --   --  6.1* 5.2* 5.4* 5.2*     CBC: Recent Labs  Lab 01/13/18 0350 01/14/18 0205 01/15/18 0440 01/16/18 0756 01/17/18 0439  WBC 7.5 9.9 15.3* 20.2* 24.5*  HGB 9.0* 10.3* 10.3* 10.3* 10.1*  HCT 28.0* 32.2* 32.7* 33.9* 31.6*  MCV 102.9* 103.7* 104.6* 106.8* 104.0*  PLT 184 278 300 343 339      Lab Results  Component Value Date   HEPBSAG Negative 01/09/2018   HEPBSAB Non Reactive 01/09/2018   HEPBIGM Negative 01/03/2018      Microbiology:  Recent Results (from the past 240 hour(s))  CULTURE, BLOOD (ROUTINE X 2) w Reflex to ID Panel     Status: None   Collection Time: 01/10/18  9:35 AM  Result Value Ref Range Status   Specimen Description BLOOD L WRIST  Final   Special Requests   Final    BOTTLES DRAWN AEROBIC AND ANAEROBIC Blood Culture adequate volume   Culture   Final  NO GROWTH 5 DAYS Performed at [facility], [address] [place], [place] [address]    Report Status 01/15/2018 FINAL  Final  CULTURE, BLOOD (ROUTINE X 2) w Reflex to ID Panel     Status: None   Collection Time: 01/10/18  9:47 AM  Result Value Ref Range Status   Specimen Description BLOOD R FOREARM  Final   Special Requests   Final    BOTTLES DRAWN AEROBIC AND ANAEROBIC Blood Culture adequate volume   Culture   Final    NO GROWTH 5 DAYS Performed at [facility], [address], [place], [place] [address]    Report Status 01/15/2018 FINAL  Final  Culture, respiratory (NON-Expectorated)     Status: None   Collection Time: 01/10/18 12:03 PM  Result Value Ref Range Status   Specimen Description   Final    TRACHEAL ASPIRATE Performed at [facility], [address], [place], [place] [address]    Special Requests   Final    NONE Performed at [facility], [address] [place],  [place] [address]    Gram Stain   Final    ABUNDANT WBC PRESENT,BOTH PMN AND MONONUCLEAR RARE SQUAMOUS EPITHELIAL CELLS PRESENT RARE BUDDING YEAST SEEN Performed at [facility], [place] [address], [place], [place] [address]    Culture MODERATE CANDIDA ALBICANS  Final   Report Status 01/13/2018 FINAL  Final  Culture, blood (Routine X 2) w Reflex to ID Panel     Status: None   Collection Time: 01/12/18 10:06 PM  Result Value Ref Range Status   Specimen Description BLOOD RIGHT HAND  Final   Special Requests   Final    BOTTLES DRAWN AEROBIC AND ANAEROBIC Blood Culture adequate volume   Culture   Final    NO GROWTH 5 DAYS Performed at [facility], [address] [place], [place] [address]    Report Status 01/17/2018 FINAL  Final  Culture, blood (Routine X 2) w Reflex to ID Panel     Status: None   Collection Time: 01/12/18 10:07 PM  Result Value Ref Range Status   Specimen Description BLOOD RIGHT ANTECUBITAL  Final   Special Requests   Final    BOTTLES DRAWN AEROBIC AND ANAEROBIC Blood Culture results may not be optimal due to an excessive volume of blood received in culture bottles   Culture   Final    NO GROWTH 5 DAYS Performed at [facility], [address], [place], [place] [address]    Report Status 01/17/2018 FINAL  Final  Culture, respiratory (NON-Expectorated)     Status: None   Collection Time: 01/13/18 12:28 AM  Result Value Ref Range Status   Specimen Description   Final    TRACHEAL ASPIRATE Performed at [facility], [address] [place], [place] [address]    Special Requests   Final    NONE Performed at [facility], [address] [place], [place] [address]    Gram Stain   Final    ABUNDANT WBC PRESENT, PREDOMINANTLY PMN NO ORGANISMS SEEN Performed at [facility], [place] [address], [place], [place] [address]    Culture FEW CANDIDA ALBICANS  Final   Report Status 01/15/2018 FINAL  Final  Urine Culture     Status:  None   Collection Time: 01/13/18  3:50 AM  Result Value Ref Range Status   Specimen Description   Final    URINE, RANDOM  Performed at [facility], [address], [place], [place] [address]    Special Requests   Final    NONE Performed at [facility], [address], [place], [place] [address]    Culture   Final    NO GROWTH Performed at [facility], [place] [address], [place], [place] [address]    Report Status 01/14/2018 FINAL  Final  Culture, respiratory (NON-Expectorated)     Status: None   Collection Time: 01/14/18 11:07 AM  Result Value Ref Range Status   Specimen Description   Final    TRACHEAL ASPIRATE Performed at [facility], [address] [place], [place] [address]    Special Requests   Final    NONE Performed at [facility], [address] [place], [place] [address]    Gram Stain   Final    RARE WBC PRESENT, PREDOMINANTLY PMN NO ORGANISMS SEEN Performed at [facility], [place] [address], [place], [place] [address]    Culture FEW CANDIDA ALBICANS  Final   Report Status 01/16/2018 FINAL  Final    Coagulation Studies: No results for input(s): LABPROT, INR in the last 72 hours.  Urinalysis: No results for input(s): COLORURINE, LABSPEC, PHURINE, GLUCOSEU, HGBUR, BILIRUBINUR, KETONESUR, PROTEINUR, UROBILINOGEN, NITRITE, LEUKOCYTESUR in the last 72 hours.  Invalid input(s): APPERANCEUR    Imaging: Dg Abd 1 View  Result Date: 01/15/2018 CLINICAL DATA:  Abdominal distention EXAM: ABDOMEN - 1 VIEW COMPARISON:  01/15/2017 FINDINGS: Examination is technically limited due to artifact from pads. Scattered gas-filled colon with mild distention of the cecum. This likely represents ileus. No small bowel distention is identified. No radiopaque stones. Surgical clips in the right upper quadrant. Enteric tube tip in the upper abdomen consistent with location in the body of the stomach. IMPRESSION: Mild prominence of  gas-filled colon likely indicating ileus. Electronically Signed   By: Lucienne Capers M.D.   On: 01/15/2018 21:07   Dg Chest Port 1 View  Result Date: 01/17/2018 CLINICAL DATA:  Acute respiratory failure EXAM: PORTABLE CHEST 1 VIEW COMPARISON:  January 16, 2018 FINDINGS: The ETT and right central line are stable and in good position. The OG tube terminates below today's film. Bilateral effusions, right greater than left, improved on the right stable on the left. Opacities underlying the effusions may represent atelectasis. Stable cardiomediastinal silhouette. IMPRESSION: 1. Support apparatus as above. 2. Bilateral pleural effusions, right greater than left, stable on the left and improved on the right in the interval with underlying atelectasis. Electronically Signed   By: Dorise Bullion III M.D   On: 01/17/2018 07:02   Dg Chest Port 1 View  Result Date: 01/16/2018 CLINICAL DATA:  Respiratory failure. EXAM: PORTABLE CHEST 1 VIEW COMPARISON:  01/15/2018. FINDINGS: Tracheostomy tube, NG tube, right IJ line in stable position. Persistent cardiomegaly. Persistent bilateral pulmonary infiltrates/edema and basilar atelectasis again noted. Slight worsening from prior exam. Right pleural effusion with slight progression from prior exam. Small left pleural effusion cannot be excluded. No pneumothorax noted. IMPRESSION: 1.  Lines and tubes stable position. 2. Cardiomegaly again noted. Bilateral pulmonary infiltrates/edema and bibasilar atelectasis again noted. Slight progression from prior exam. Right pleural effusion with slight progression from prior exam. Small left pleural effusion cannot be excluded on today's exam. Electronically Signed   By: Marcello Moores  Register   On: 01/16/2018 06:57   Dg Chest Port 1 View  Result Date: 01/15/2018 CLINICAL DATA:  52 year old critically ill male with fever, septic shock  with multi organ failure. EXAM: PORTABLE CHEST 1 VIEW COMPARISON:  Portable chest 0408 hours today, and earlier.  FINDINGS: Portable AP semi upright view at 1602 hours. Stable endotracheal tube tip in good position between the clavicles and carina. Enteric tube courses to the abdomen, tip not included. Stable right IJ central line. Stable cardiomegaly and mediastinal contours. Dense retrocardiac and lung base opacity with superimposed veiling opacity on the right is stable. No superimposed pneumothorax. Pulmonary vascularity is stable. Paucity of bowel gas in the upper abdomen. IMPRESSION: 1.  Stable lines and tubes. 2. Stable ventilation with bilateral lower lobe consolidation and right pleural effusion. Electronically Signed   By: Genevie Ann M.D.   On: 01/15/2018 16:30     Medications:   . sodium chloride Stopped (01/11/18 1517)  . amiodarone 60 mg/hr (01/17/18 4196)  . anticoagulant sodium citrate    . fentaNYL infusion INTRAVENOUS 350 mcg/hr (01/17/18 0612)  . fluconazole (DIFLUCAN) IV    . heparin 1,050 Units/hr (01/17/18 0114)   . chlorhexidine gluconate (MEDLINE KIT)  15 mL Mouth Rinse BID  . Chlorhexidine Gluconate Cloth  6 each Topical Q0600  . diltiazem  60 mg Oral Q6H  . sennosides  5 mL Per Tube BID   And  . docusate  100 mg Per Tube BID  . famotidine  20 mg Per Tube BID  . feeding supplement (PRO-STAT SUGAR FREE 64)  60 mL Per Tube 5 X Daily  . feeding supplement (VITAL HIGH PROTEIN)  1,000 mL Per Tube Q24H  . fentaNYL (SUBLIMAZE) injection  50 mcg Intravenous Once  . free water  250 mL Per Tube Q4H  . insulin aspart  0-15 Units Subcutaneous Q4H  . insulin aspart  5 Units Subcutaneous Q4H  . ipratropium-albuterol  3 mL Nebulization Q6H  . mouth rinse  15 mL Mouth Rinse 10 times per day  . metoCLOPramide (REGLAN) injection  5 mg Intravenous Q8H  . multivitamin  15 mL Per Tube Daily  . nutrition supplement (JUVEN)  1 packet Per Tube BID BM  . polyvinyl alcohol  1 drop Both Eyes TID  . sodium chloride flush  10-40 mL Intracatheter Q12H  . sodium chloride flush  3 mL Intravenous Q12H  .  thiamine  100 mg Per Tube Daily   sodium chloride, acetaminophen **OR** acetaminophen, anticoagulant sodium citrate, bisacodyl, fentaNYL, iopamidol, ipratropium-albuterol, labetalol, midazolam, neomycin-bacitracin-polymyxin, sodium chloride flush, sodium chloride flush, vecuronium  Assessment/ Plan:  52 y.o. male with diabetes mellitus type 2, hypertension, chronic systolic heart failure ejection fraction 45%, who was admitted to [facility] on6/21/2019for evaluation of abdominal discomfort and bloating. Patient had decompensation on January 03, 2018 with severe hypotension requiring multiple pressors, acute respiratory failure, shock liver and acute renal failure. Started CRRT from 6/22 to 6/28. Intermittent hemodialysis on 6/28. Trial of furosemide with good results on 6/29.   1. Acute renal failure secondary to severe hypotension,and also exposed to iv contrast with CTA. Baseline creatinine 1.14, 12/12/16 2. Generalized edema 3.  Acute respiratory failure.     Remains critically ill.  Urine output has decreased significantly  serum creatinine and BUN are worse likely from sepsis  Maintain hemodynamic support and avoid hypotension  Will dialyze patient again today today We will continue to follow     LOS: [place] 7/6/20199:04 AM  [place], [place]  Note: This note was prepared with Dragon dictation. Any transcription errors are unintentional

## 2018-01-17 NOTE — Progress Notes (Signed)
Dialysis treatment completed safely

## 2018-01-17 NOTE — Progress Notes (Signed)
[employer] [facility] [place] [facility]   PATIENT NAME: Ryan Webb    MR#:  [id number]  DATE OF BIRTH:  08/23/1965  SUBJECTIVE:   Patient remains critically ill.    on vent, febrile, tachycardic heart rate around 110s  REVIEW OF SYSTEMS:    unAble to obtain  tolerating Diet: Tube feeds DRUG ALLERGIES:   Allergies  Allergen Reactions  . No Known Allergies     VITALS:  Blood pressure (!) 101/59, pulse (!) 111, temperature 99 F (37.2 C), resp. rate (!) 25, height 5\' 9"  (1.753 m), weight (!) 145.6 kg (320 lb 15.8 oz), SpO2 99 %.  PHYSICAL EXAMINATION:  Constitutional: Appears obese sedated on vent, critically ill HENT: Normocephalic.  Intubated Eyes: , no scleral icterus.  Neck: Normal ROM. Neck supple. No JVD. No tracheal deviation. CVS: Tachycardic S1/S2 +, no murmurs, no gallops, no carotid bruit.  Pulmonary: Effort and breath sounds normal, no stridor, rhonchi, wheezes, rales.  Abdominal: Soft. BS +,  no distension, tenderness, rebound or guarding.  Musculoskeletal: sedated Neuro:sedated     . Skin: Skin is warm and dry. No rash noted.  2+ lower extremity edema Psychiatric: sedated  LABORATORY PANEL:   CBC Recent Labs  Lab 01/17/18 0439  WBC 24.5*  HGB 10.1*  HCT 31.6*  PLT 339   ------------------------------------------------------------------------------------------------------------------  Chemistries  Recent Labs  Lab 01/17/18 0439  NA 141  K 4.7  CL 103  CO2 24  GLUCOSE 206*  BUN 176*  CREATININE 4.05*  CALCIUM 7.7*  MG 3.1*   ------------------------------------------------------------------------------------------------------------------  Cardiac Enzymes No results for input(s): TROPONINI in the last 168 hours. ------------------------------------------------------------------------------------------------------------------  RADIOLOGY:  Dg Abd 1 View  Result Date: 01/15/2018 CLINICAL DATA:  Abdominal distention EXAM: ABDOMEN  - 1 VIEW COMPARISON:  01/15/2017 FINDINGS: Examination is technically limited due to artifact from pads. Scattered gas-filled colon with mild distention of the cecum. This likely represents ileus. No small bowel distention is identified. No radiopaque stones. Surgical clips in the right upper quadrant. Enteric tube tip in the upper abdomen consistent with location in the body of the stomach. IMPRESSION: Mild prominence of gas-filled colon likely indicating ileus. Electronically Signed   By: Burman NievesWilliam  [place] M.D.   On: 01/15/2018 21:07   Dg Chest Port 1 View  Result Date: 01/17/2018 CLINICAL DATA:  Acute respiratory failure EXAM: PORTABLE CHEST 1 VIEW COMPARISON:  January 16, 2018 FINDINGS: The ETT and right central line are stable and in good position. The OG tube terminates below today's film. Bilateral effusions, right greater than left, improved on the right stable on the left. Opacities underlying the effusions may represent atelectasis. Stable cardiomediastinal silhouette. IMPRESSION: 1. Support apparatus as above. 2. Bilateral pleural effusions, right greater than left, stable on the left and improved on the right in the interval with underlying atelectasis. Electronically Signed   By: Gerome Samavid  Williams III M.D   On: 01/17/2018 07:02   Dg Chest Port 1 View  Result Date: 01/16/2018 CLINICAL DATA:  Respiratory failure. EXAM: PORTABLE CHEST 1 VIEW COMPARISON:  01/15/2018. FINDINGS: Tracheostomy tube, NG tube, right IJ line in stable position. Persistent cardiomegaly. Persistent bilateral pulmonary infiltrates/edema and basilar atelectasis again noted. Slight worsening from prior exam. Right pleural effusion with slight progression from prior exam. Small left pleural effusion cannot be excluded. No pneumothorax noted. IMPRESSION: 1.  Lines and tubes stable position. 2. Cardiomegaly again noted. Bilateral pulmonary infiltrates/edema and bibasilar atelectasis again noted. Slight progression from prior exam. Right  pleural effusion  with slight progression from prior exam. Small left pleural effusion cannot be excluded on today's exam. Electronically Signed   By: Maisie Fus  Register   On: 01/16/2018 06:57   Dg Chest Port 1 View  Result Date: 01/15/2018 CLINICAL DATA:  52 year old critically ill male with fever, septic shock with multi organ failure. EXAM: PORTABLE CHEST 1 VIEW COMPARISON:  Portable chest 0408 hours today, and earlier. FINDINGS: Portable AP semi upright view at 1602 hours. Stable endotracheal tube tip in good position between the clavicles and carina. Enteric tube courses to the abdomen, tip not included. Stable right IJ central line. Stable cardiomegaly and mediastinal contours. Dense retrocardiac and lung base opacity with superimposed veiling opacity on the right is stable. No superimposed pneumothorax. Pulmonary vascularity is stable. Paucity of bowel gas in the upper abdomen. IMPRESSION: 1.  Stable lines and tubes. 2. Stable ventilation with bilateral lower lobe consolidation and right pleural effusion. Electronically Signed   By: Odessa Fleming M.D.   On: 01/15/2018 16:30     ASSESSMENT AND PLAN:   52 year old male with obesity, diabetes, hypertension came in with tachyarrhythmia and was noted to be in septic shock  * Septic shock with multiorgan failure.- PNA -Continue vent management as per ICU -Sputum culture obtained from tracheal aspirate is positive for gram-positive cocci in clusters and gram-negative coccobacilli 6/22 -IV zyvox, zosyn and eraxis empirically.  Follow-up on the cultures 6/29 -pt is s/o bronchoscopy with sputum cx pending -Previous blood cultures obtained on 01/03/2018 are negative.  Urine culture obtained on 01/03/2018 is also negative -MRSA PCR is negative  * Hypernatremia with hypokalemia --resolved  * Ectopic atrial tachycardia versus atrial flutter-remains tachycardic CT Angio of the chest and pelvis negative for any PE or aortic dissection. Echocardiogram with LV  ejection fraction of 45% which is stable from prior. Platelet  count is better and patient is started on heparin drip.   -IV amiodarone gtt  And po Cardizem per cardiolgoy  * Acute renal failure with metabolic acidosis due to ATN from septic shock and contrast exposure due to CT scans.  Baseline creatinine 1.14 Patient was taking metformin and TCA as an outpatient, had hemodialysis  6/28.   Management per nephrology creatinine upto 3.36--4.05--resumed HD again  *  Thrombocytopenia due to sepsis: Platelet count is increasing--back to normal Hit panel negative Last Platelets at 343,000  *Acute respiratory failure: Due to sepsis and multiorgan failure Remains on ventilator and management per ICU team  * Elevated LFTs due to shock liver with multiorgan failure due to sepsis.  LFTs are improving GI consultation appreciated  *  Nutrition: Continue tube feeds- vital Overall prognosis guarded.  Patient is critically ill.  Poor prognosis with high mortality and morbidity   CODE STATUS: FULL  TOTAL TIME TAKING CARE OF THIS PATIENT: 25 minutes.     POSSIBLE D/C ??, DEPENDING ON CLINICAL CONDITION.   Enedina Finner M.D on 01/17/2018 at 11:16 AM  Between 7am to 6pm - Pager - [phone] After 6pm go to [email or web address] - password [email or web address]  [facility]  Office  [phone]  CC: Primary care physician; Anola Gurney, PA  Note: This dictation was prepared with Dragon dictation along with smaller phrase technology. Any transcriptional errors that result from this process are unintentional.

## 2018-01-18 LAB — GLUCOSE, CAPILLARY
GLUCOSE-CAPILLARY: 151 mg/dL — AB (ref 70–99)
GLUCOSE-CAPILLARY: 185 mg/dL — AB (ref 70–99)
GLUCOSE-CAPILLARY: 191 mg/dL — AB (ref 70–99)
Glucose-Capillary: 175 mg/dL — ABNORMAL HIGH (ref 70–99)
Glucose-Capillary: 117 mg/dL — ABNORMAL HIGH (ref 70–99)
Glucose-Capillary: 147 mg/dL — ABNORMAL HIGH (ref 70–99)

## 2018-01-18 LAB — RENAL FUNCTION PANEL
ALBUMIN: 2.7 g/dL — AB (ref 3.5–5.0)
Anion gap: 14 (ref 5–15)
BUN: 156 mg/dL — AB (ref 6–20)
CO2: 24 mmol/L (ref 22–32)
CREATININE: 4.54 mg/dL — AB (ref 0.61–1.24)
Calcium: 7.7 mg/dL — ABNORMAL LOW (ref 8.9–10.3)
Chloride: 102 mmol/L (ref 98–111)
GFR calc Af Amer: 16 mL/min — ABNORMAL LOW (ref >60)
GFR, EST NON AFRICAN AMERICAN: 14 mL/min — AB (ref >60)
Glucose, Bld: 215 mg/dL — ABNORMAL HIGH (ref 70–99)
PHOSPHORUS: 6.2 mg/dL — AB (ref 2.5–4.6)
POTASSIUM: 5 mmol/L (ref 3.5–5.1)
Sodium: 140 mmol/L (ref 135–145)

## 2018-01-18 LAB — HEPARIN LEVEL (UNFRACTIONATED)
HEPARIN UNFRACTIONATED: 0.25 [IU]/mL — AB (ref 0.30–0.70)
Heparin Unfractionated: 0.13 IU/mL — ABNORMAL LOW (ref 0.30–0.70)
Heparin Unfractionated: 0.6 IU/mL (ref 0.30–0.70)

## 2018-01-18 LAB — MAGNESIUM: Magnesium: 2.8 mg/dL — ABNORMAL HIGH (ref 1.7–2.4)

## 2018-01-18 MED ORDER — FAMOTIDINE IN NACL 20-0.9 MG/50ML-% IV SOLN
20.0000 mg | Freq: Two times a day (BID) | INTRAVENOUS | Status: DC
  Administered 2018-01-18 – 2018-01-21 (×7): 20 mg via INTRAVENOUS
  Filled 2018-01-18 (×7): qty 50

## 2018-01-18 MED ORDER — HEPARIN (PORCINE) IN NACL 100-0.45 UNIT/ML-% IJ SOLN
1500.0000 [IU]/h | INTRAMUSCULAR | Status: DC
  Administered 2018-01-19 – 2018-01-20 (×3): 1500 [IU]/h via INTRAVENOUS
  Filled 2018-01-18 (×4): qty 250

## 2018-01-18 MED ORDER — HEPARIN BOLUS VIA INFUSION
3000.0000 [IU] | Freq: Once | INTRAVENOUS | Status: AC
  Administered 2018-01-18: 3000 [IU] via INTRAVENOUS
  Filled 2018-01-18: qty 3000

## 2018-01-18 MED ORDER — HEPARIN BOLUS VIA INFUSION
1450.0000 [IU] | Freq: Once | INTRAVENOUS | Status: AC
  Administered 2018-01-18: 1450 [IU] via INTRAVENOUS
  Filled 2018-01-18: qty 1450

## 2018-01-18 NOTE — Progress Notes (Signed)
Decreased to 35% 

## 2018-01-18 NOTE — Progress Notes (Signed)
Wake up assessment deferred at this time.   Dr. Belia HemanKasa reports he is deferring due to multiple complex medical problems, and episode of desating last night.

## 2018-01-18 NOTE — Progress Notes (Signed)
This note also relates to the following rows which could not be included: Pulse Rate - Cannot attach notes to unvalidated device data Resp - Cannot attach notes to unvalidated device data  Hd completed  

## 2018-01-18 NOTE — Progress Notes (Signed)
[employer] [facility] [place] [facility]   PATIENT NAME: Orson Slickony Mcgruder    MR#:  [id number]  DATE OF BIRTH:  04/02/1966  SUBJECTIVE:   Patient remains critically ill. Started back on hemodialysis. He almost coded last night. Had to be bagged and given vecuronium REVIEW OF SYSTEMS:    unAble to obtain  tolerating Diet: Tube feeds DRUG ALLERGIES:   Allergies  Allergen Reactions  . No Known Allergies     VITALS:  Blood pressure (!) 102/56, pulse (!) 106, temperature 99.4 F (37.4 C), temperature source Axillary, resp. rate (!) 25, height 5\' 9"  (1.753 m), weight (!) 137.8 kg (303 lb 12.7 oz), SpO2 97 %.  PHYSICAL EXAMINATION:  Constitutional: Appears obese sedated on vent, critically ill HENT: Normocephalic.  Intubated Eyes: , no scleral icterus.  Neck: Normal ROM. Neck supple. No JVD. No tracheal deviation. CVS: Tachycardic S1/S2 +, no murmurs, no gallops, no carotid bruit.  Pulmonary: Effort and breath sounds normal, no stridor, rhonchi, wheezes, rales.  Abdominal: Soft. BS +,  no distension, tenderness, rebound or guarding.  Musculoskeletal: sedated Neuro:sedated     . Skin: Skin is warm and dry. No rash noted.  2+ lower extremity edema Psychiatric: sedated  LABORATORY PANEL:   CBC Recent Labs  Lab 01/17/18 0439  WBC 24.5*  HGB 10.1*  HCT 31.6*  PLT 339   ------------------------------------------------------------------------------------------------------------------  Chemistries  Recent Labs  Lab 01/18/18 0507  NA 140  K 5.0  CL 102  CO2 24  GLUCOSE 215*  BUN 156*  CREATININE 4.54*  CALCIUM 7.7*  MG 2.8*   ------------------------------------------------------------------------------------------------------------------  Cardiac Enzymes No results for input(s): TROPONINI in the last 168 hours. ------------------------------------------------------------------------------------------------------------------  RADIOLOGY:  Dg Abd 1  View  Result Date: 01/18/2018 CLINICAL DATA:  Abdominal distension EXAM: ABDOMEN - 1 VIEW COMPARISON:  01/15/2018, CT 01/08/2018 FINDINGS: Consolidation and pleural effusions at the bases. Esophageal tube tip and side port overlie the gastric body. Continued gaseous dilatation of the colon. Decreased small bowel gas. IMPRESSION: 1. Esophageal tube tip overlies the gastric body 2. Continued gaseous dilatation of the colon, possible ileus Electronically Signed   By: Jasmine PangKim  Fujinaga M.D.   On: 01/18/2018 00:56   Dg Chest Port 1 View  Result Date: 01/18/2018 CLINICAL DATA:  Abdominal distension, intubated patient EXAM: PORTABLE CHEST 1 VIEW COMPARISON:  01/17/2018, 01/15/2018, CT chest 01/14/2018 FINDINGS: Endotracheal tube tip is about 4.5 cm superior to the carina. Esophageal tube tip extends below the diaphragm but is not included on the image. Right-sided central venous catheter tip overlies the SVC. Cardiomegaly with vascular congestion and mild inter lung edema. Moderate to large layering right pleural effusion and at least small left effusion. No change in bibasilar consolidations. Old left clavicle fracture IMPRESSION: 1. Endotracheal tube tip about 4.5 cm superior to the carina 2. Continued moderate likely layering right-sided pleural effusion with small moderate left pleural effusion. 3. Cardiomegaly with vascular congestion and underlying edema. No change in bibasilar consolidations. Electronically Signed   By: Jasmine PangKim  Fujinaga M.D.   On: 01/18/2018 00:54   Dg Chest Port 1 View  Result Date: 01/17/2018 CLINICAL DATA:  Acute respiratory failure EXAM: PORTABLE CHEST 1 VIEW COMPARISON:  January 16, 2018 FINDINGS: The ETT and right central line are stable and in good position. The OG tube terminates below today's film. Bilateral effusions, right greater than left, improved on the right stable on the left. Opacities underlying the effusions may represent atelectasis. Stable cardiomediastinal silhouette. IMPRESSION:  1. Support  apparatus as above. 2. Bilateral pleural effusions, right greater than left, stable on the left and improved on the right in the interval with underlying atelectasis. Electronically Signed   By: Gerome Sam III M.D   On: 01/17/2018 07:02     ASSESSMENT AND PLAN:   52 year old male with obesity, diabetes, hypertension came in with tachyarrhythmia and was noted to be in septic shock  * Septic shock with multiorgan failure.- PNA -Continue vent management as per ICU -Sputum culture obtained from tracheal aspirate is positive for gram-positive cocci in clusters and gram-negative coccobacilli 6/22 -IV zyvox, zosyn and eraxis empirically---> changed to IV Diflucan.  -pt is s/o bronchoscopy with sputum cx no organism -Previous blood cultures obtained on 01/03/2018 are negative.  Urine culture obtained on 01/03/2018 is also negative -MRSA PCR is negative  * Hypernatremia with hypokalemia --resolved  * Ectopic atrial tachycardia versus atrial flutter-remains tachycardic CT Angio of the chest and pelvis negative for any PE or aortic dissection. Echocardiogram with LV ejection fraction of 45% which is stable from prior. Platelet  count is better and patient is started on heparin drip.   -IV amiodarone gtt  And po Cardizem per cardiolgoy  * Acute renal failure with metabolic acidosis due to ATN from septic shock and contrast exposure due to CT scans.  Baseline creatinine 1.14 Patient was taking metformin and TCA as an outpatient, had hemodialysis  6/28.   Management per nephrology creatinine upto 3.36--4.05--resumed HD again  *  Thrombocytopenia due to sepsis: Platelet count is increasing--back to normal Hit panel negative Last Platelets at 343,000  *Acute respiratory failure: Due to sepsis and multiorgan failure Remains on ventilator and management per ICU team  * Elevated LFTs due to shock liver with multiorgan failure due to sepsis.  LFTs are improving GI consultation  appreciated  *  Nutrition: Continue tube feeds- vital Overall prognosis guarded.  Patient is critically ill.  Poor prognosis with high mortality and morbidity   CODE STATUS: FULL  TOTAL TIME TAKING CARE OF THIS PATIENT: 25 minutes.     POSSIBLE D/C ??, DEPENDING ON CLINICAL CONDITION.   Enedina Finner M.D on 01/18/2018 at 1:56 PM  Between 7am to 6pm - Pager - [phone] After 6pm go to [email or web address] - password [email or web address]  [facility]  Office  [phone]  CC: Primary care physician; Anola Gurney, PA  Note: This dictation was prepared with Dragon dictation along with smaller phrase technology. Any transcriptional errors that result from this process are unintentional.

## 2018-01-18 NOTE — Progress Notes (Signed)
ANTICOAGULATION CONSULT NOTE - Follow Up Consult  Pharmacy Consult for Heparin Drip Indication: atrial fibrillation  Allergies  Allergen Reactions  . No Known Allergies     Patient Measurements: Height: 5\' 9"  (175.3 cm) Weight: (!) 303 lb 12.7 oz (137.8 kg) IBW/kg (Calculated) : 70.7 Heparin Dosing Weight: 98.9 kg  Vital Signs: Temp: 98.7 F (37.1 C) (07/07 0500) BP: 119/64 (07/07 1130) Pulse Rate: 108 (07/07 1130)  Labs: Recent Labs    01/16/18 0756 01/17/18 0439 01/17/18 0833 01/18/18 0507 01/18/18 1105  HGB 10.3* 10.1*  --   --   --   HCT 33.9* 31.6*  --   --   --   PLT 343 339  --   --   --   HEPARINUNFRC 0.34  --  0.30 0.25* 0.13*  CREATININE 3.36* 4.05*  --  4.54*  --     Estimated Creatinine Clearance: 26.5 mL/min (A) (by C-G formula based on SCr of 4.54 mg/dL (H)).  Pharmacy consulted for heparin drip management for 52 yo male admitted with tachyarrhythmias.   Goal of Therapy:  Heparin level 0.3-0.7 units/ml Monitor platelets by anticoagulation protocol: Yes   Assessment/Plan:  07/07 @ 11:05  HL 0.13  subtherapeutic. Will bolus w/ heparin 3000 units and increase rate to 1500 units/hr. Recheck anti-Xa in 8 hours tonight at 1930.  Stormy CardKatsoudas,Dreyah Montrose K, [facility]PH Clinical Pharmacist 01/18/2018, 11:38 AM

## 2018-01-18 NOTE — Progress Notes (Signed)
ANTICOAGULATION CONSULT NOTE - Follow Up Consult  Pharmacy Consult for Heparin Drip Indication: atrial fibrillation  Allergies  Allergen Reactions  . No Known Allergies     Patient Measurements: Height: 5\' 9"  (175.3 cm) Weight: (!) 303 lb 12.7 oz (137.8 kg) IBW/kg (Calculated) : 70.7 Heparin Dosing Weight: 98.9 kg  Vital Signs: Temp: 98.7 F (37.1 C) (07/07 0500) BP: 119/62 (07/07 0602) Pulse Rate: 107 (07/07 0500)  Labs: Recent Labs    01/16/18 0756 01/17/18 0439 01/17/18 0833 01/18/18 0507  HGB 10.3* 10.1*  --   --   HCT 33.9* 31.6*  --   --   PLT 343 339  --   --   HEPARINUNFRC 0.34  --  0.30 0.25*  CREATININE 3.36* 4.05*  --  4.54*    Estimated Creatinine Clearance: 26.5 mL/min (A) (by C-G formula based on SCr of 4.54 mg/dL (H)).  Pharmacy consulted for heparin drip management for 52 yo male admitted with tachyarrhythmias.  7/6 HL = 0.3.  Goal of Therapy:  Heparin level 0.3-0.7 units/ml Monitor platelets by anticoagulation protocol: Yes   Assessment/Plan:  07/07 @ 0500 HL 0.25 subtherapeutic. Will rebolus w/ heparin 1450 units IV x 1 and increase rate to 1150 units/hr and will recheck anti-Xa @ 1100.  Thomasene Rippleavid  Ha Placeres, [facility]ph[facility]PH Clinical Pharmacist 01/18/2018, 9:43 AM

## 2018-01-18 NOTE — Progress Notes (Signed)
This note also relates to the following rows which could not be included: Pulse Rate - Cannot attach notes to unvalidated device data Resp - Cannot attach notes to unvalidated device data  Hd started  

## 2018-01-18 NOTE — Progress Notes (Signed)
[facility], [place] 01/18/18  Subjective:   Critically ill, Sedated, vent support FiO2 45%, tube feeds on hold due to ileus patient is continued on amiodarone, fentanyl, heparin infusions.   He remains tachycardic with heart rate in the low 100's.    Urine output is lower now recorded at 50 cc last 24 hours BUN/Creatinine remain critically high Tolerated short intermittent HD yesterday. 3rd daily treatment today     Objective:  Vital signs in last 24 hours:  Temp:  [98.3 F (36.8 C)-99.7 F (37.6 C)] 98.7 F (37.1 C) (07/07 0500) Pulse Rate:  [106-126] 106 (07/07 0600) Resp:  [22-26] 25 (07/07 0600) BP: (79-119)/(35-73) 119/62 (07/07 0602) SpO2:  [81 %-100 %] 97 % (07/07 0748) FiO2 (%):  [35 %-100 %] 45 % (07/07 0748) Weight:  [137.8 kg (303 lb 12.7 oz)-151.4 kg (333 lb 12.4 oz)] 137.8 kg (303 lb 12.7 oz) (07/07 0146)  Weight change: 5.8 kg (12 lb 12.6 oz) Filed Weights   01/17/18 0317 01/17/18 1115 01/18/18 0146  Weight: (!) 145.6 kg (320 lb 15.8 oz) (!) 151.4 kg (333 lb 12.4 oz) (!) 137.8 kg (303 lb 12.7 oz)    Intake/Output:    Intake/Output Summary (Last 24 hours) at 01/18/2018 0853 Last data filed at 01/18/2018 0556 Gross per 24 hour  Intake -  Output 550 ml  Net -550 ml     Physical Exam: General:  Critically ill-appearing, laying in the bed  HEENT   ET tube in place  Pulm/lungs  ventilator assisted,    CVS/Heart  sinus tachycardia   Abdomen:   distended, scrotal edema, scant bowel sounds  Extremities:  2-3+ generalized pitting edema  Neurologic:  Sedated  Skin:  mottling rt leg and rt arm- improving  Access: Rt femoral vascath   Foley catheter in place    Basic Metabolic Panel:  Recent Labs  Lab 01/14/18 0205 01/14/18 0430 01/15/18 0440 01/16/18 0756 01/17/18 0439 01/18/18 0507  NA  --  148* 148* 146* 141 140  K  --  3.8 4.1 5.0 4.7 5.0  CL  --  112* 110 103 103 102  CO2  --  _0 GLUCOSE  --  215*  257* 249* 206* 215*  BUN  --  119* 139* 180* 176* 156*  CREATININE  --  2.30* 2.43* 3.36* 4.05* 4.54*  CALCIUM  --  8.0* 8.1* 8.0* 7.7* 7.7*  MG 2.4  --  2.5* 3.4* 3.1* 2.8*  PHOS  --  6.1* 5.2* 5.4* 5.2* 6.2*     CBC: Recent Labs  Lab 01/13/18 0350 01/14/18 0205 01/15/18 0440 01/16/18 0756 01/17/18 0439  WBC 7.5 9.9 15.3* 20.2* 24.5*  HGB 9.0* 10.3* 10.3* 10.3* 10.1*  HCT 28.0* 32.2* 32.7* 33.9* 31.6*  MCV 102.9* 103.7* 104.6* 106.8* 104.0*  PLT 184 278 300 343 339      Lab Results  Component Value Date   HEPBSAG Negative 01/09/2018   HEPBSAB Non Reactive 01/09/2018   HEPBIGM Negative 01/03/2018      Microbiology:  Recent Results (from the past 240 hour(s))  CULTURE, BLOOD (ROUTINE X 2) w Reflex to ID Panel     Status: None   Collection Time: 01/10/18  9:35 AM  Result Value Ref Range Status   Specimen Description BLOOD L WRIST  Final   Special Requests   Final    BOTTLES DRAWN AEROBIC AND ANAEROBIC Blood Culture adequate volume   Culture   Final  NO GROWTH 5 DAYS Performed at [facility], [address] [place], [place] [address]    Report Status 01/15/2018 FINAL  Final  CULTURE, BLOOD (ROUTINE X 2) w Reflex to ID Panel     Status: None   Collection Time: 01/10/18  9:47 AM  Result Value Ref Range Status   Specimen Description BLOOD R FOREARM  Final   Special Requests   Final    BOTTLES DRAWN AEROBIC AND ANAEROBIC Blood Culture adequate volume   Culture   Final    NO GROWTH 5 DAYS Performed at [facility], [address] [place], [place] [address]    Report Status 01/15/2018 FINAL  Final  Culture, respiratory (NON-Expectorated)     Status: None   Collection Time: 01/10/18 12:03 PM  Result Value Ref Range Status   Specimen Description   Final    TRACHEAL ASPIRATE Performed at [facility], [address], [place], [place] [address]    Special Requests   Final    NONE Performed at [facility], [address] [place], [place] [address]    Gram Stain   Final    ABUNDANT WBC PRESENT,BOTH PMN AND MONONUCLEAR RARE SQUAMOUS EPITHELIAL CELLS PRESENT RARE BUDDING YEAST SEEN Performed at [facility], [place] [address] [place], [place] [address]    Culture MODERATE CANDIDA ALBICANS  Final   Report Status 01/13/2018 FINAL  Final  Culture, blood (Routine X 2) w Reflex to ID Panel     Status: None   Collection Time: 01/12/18 10:06 PM  Result Value Ref Range Status   Specimen Description BLOOD RIGHT HAND  Final   Special Requests   Final    BOTTLES DRAWN AEROBIC AND ANAEROBIC Blood Culture adequate volume   Culture   Final    NO GROWTH 5 DAYS Performed at [facility], [address] [place], [place] [address]    Report Status 01/17/2018 FINAL  Final  Culture, blood (Routine X 2) w Reflex to ID Panel     Status: None   Collection Time: 01/12/18 10:07 PM  Result Value Ref Range Status   Specimen Description BLOOD RIGHT ANTECUBITAL  Final   Special Requests   Final    BOTTLES DRAWN AEROBIC AND ANAEROBIC Blood Culture results may not be optimal due to an excessive volume of blood received in culture bottles   Culture   Final    NO GROWTH 5 DAYS Performed at [facility], [address] [place], [place] [address]    Report Status 01/17/2018 FINAL  Final  Culture, respiratory (NON-Expectorated)     Status: None   Collection Time: 01/13/18 12:28 AM  Result Value Ref Range Status   Specimen Description   Final    TRACHEAL ASPIRATE Performed at [facility], [address], [place], [place] [address]    Special Requests   Final    NONE Performed at [facility], [address] [place], [place] [address]    Gram Stain   Final    ABUNDANT WBC PRESENT, PREDOMINANTLY PMN NO ORGANISMS SEEN Performed at [facility], [place] [address], [place],  [address]    Culture FEW CANDIDA ALBICANS  Final   Report Status 01/15/2018 FINAL  Final   Urine Culture     Status: None   Collection Time: 01/13/18  3:50 AM  Result Value Ref Range Status   Specimen Description   Final    URINE, RANDOM  Performed at [facility], [address] [place], [place] [address]    Special Requests   Final    NONE Performed at [facility], [address], [place], [place] [address]    Culture   Final    NO GROWTH Performed at [facility], [place] [address], [place], [place] [address]    Report Status 01/14/2018 FINAL  Final  Culture, respiratory (NON-Expectorated)     Status: None   Collection Time: 01/14/18 11:07 AM  Result Value Ref Range Status   Specimen Description   Final    TRACHEAL ASPIRATE Performed at [facility], [address], [place], [place] [address]    Special Requests   Final    NONE Performed at [facility], [address] [place], [place] [address]    Gram Stain   Final    RARE WBC PRESENT, PREDOMINANTLY PMN NO ORGANISMS SEEN Performed at [facility], [place] [address], [place],  [address]    Culture FEW CANDIDA ALBICANS  Final   Report Status 01/16/2018 FINAL  Final    Coagulation Studies: No results for input(s): LABPROT, INR in the last 72 hours.  Urinalysis: No results for input(s): COLORURINE, LABSPEC, PHURINE, GLUCOSEU, HGBUR, BILIRUBINUR, KETONESUR, PROTEINUR, UROBILINOGEN, NITRITE, LEUKOCYTESUR in the last 72 hours.  Invalid input(s): APPERANCEUR    Imaging: Dg Abd 1 View  Result Date: 01/18/2018 CLINICAL DATA:  Abdominal distension EXAM: ABDOMEN - 1 VIEW COMPARISON:  01/15/2018, CT 12/17/2017 FINDINGS: Consolidation and pleural effusions at the bases. Esophageal tube tip and side port overlie the gastric body. Continued gaseous dilatation of the colon. Decreased small bowel gas. IMPRESSION: 1. Esophageal tube tip overlies the gastric body 2. Continued gaseous dilatation of the colon, possible ileus Electronically Signed   By: Donavan Foil  M.D.   On: 01/18/2018 00:56   Dg Chest Port 1 View  Result Date: 01/18/2018 CLINICAL DATA:  Abdominal distension, intubated patient EXAM: PORTABLE CHEST 1 VIEW COMPARISON:  01/17/2018, 01/15/2018, CT chest 01/14/2018 FINDINGS: Endotracheal tube tip is about 4.5 cm superior to the carina. Esophageal tube tip extends below the diaphragm but is not included on the image. Right-sided central venous catheter tip overlies the SVC. Cardiomegaly with vascular congestion and mild inter lung edema. Moderate to large layering right pleural effusion and at least small left effusion. No change in bibasilar consolidations. Old left clavicle fracture IMPRESSION: 1. Endotracheal tube tip about 4.5 cm superior to the carina 2. Continued moderate likely layering right-sided pleural effusion with small moderate left pleural effusion. 3. Cardiomegaly with vascular congestion and underlying edema. No change in bibasilar consolidations. Electronically Signed   By: Donavan Foil M.D.   On: 01/18/2018 00:54   Dg Chest Port 1 View  Result Date: 01/17/2018 CLINICAL DATA:  Acute respiratory failure EXAM: PORTABLE CHEST 1 VIEW COMPARISON:  January 16, 2018 FINDINGS: The ETT and right central line are stable and in good position. The OG tube terminates below today's film. Bilateral effusions, right greater than left, improved on the right stable on the left. Opacities underlying the effusions may represent atelectasis. Stable cardiomediastinal silhouette. IMPRESSION: 1. Support apparatus as above. 2. Bilateral pleural effusions, right greater than left, stable on the left and improved on the right in the interval with underlying atelectasis. Electronically Signed   By: Dorise Bullion III M.D   On: 01/17/2018 07:02     Medications:   . sodium chloride Stopped (01/11/18 1517)  . amiodarone 60  mg/hr (01/18/18 0551)  . anticoagulant sodium citrate    . fentaNYL infusion INTRAVENOUS 350 mcg/hr (01/18/18 0318)  . fluconazole (DIFLUCAN)  IV    . heparin 1,150 Units/hr (01/18/18 6579)   . chlorhexidine gluconate (MEDLINE KIT)  15 mL Mouth Rinse BID  . Chlorhexidine Gluconate Cloth  6 each Topical Q0600  . fentaNYL (SUBLIMAZE) injection  50 mcg Intravenous Once  . insulin aspart  0-15 Units Subcutaneous Q4H  . ipratropium-albuterol  3 mL Nebulization Q6H  . mouth rinse  15 mL Mouth Rinse 10 times per day  . polyvinyl alcohol  1 drop Both Eyes TID  . sodium chloride flush  10-40 mL Intracatheter Q12H  . sodium chloride flush  3 mL Intravenous Q12H   sodium chloride, acetaminophen **OR** acetaminophen, anticoagulant sodium citrate, bisacodyl, fentaNYL, iopamidol, ipratropium-albuterol, labetalol, midazolam, neomycin-bacitracin-polymyxin, sodium chloride flush, sodium chloride flush, vecuronium  Assessment/ Plan:  52 y.o. male with diabetes mellitus type 2, hypertension, chronic systolic heart failure ejection fraction 45%, who was admitted to [facility] on[facility]2019for evaluation of abdominal discomfort and bloating. Patient had decompensation on January 03, 2018 with severe hypotension requiring multiple pressors, acute respiratory failure, shock liver and acute renal failure. Started CRRT from 6/22 to 6/28. Intermittent hemodialysis on 6/28. Trial of furosemide with good results on 6/29. Patient required paralytics during this hospitalization.   1. Acute renal failure secondary to severe hypotension,and also exposed to iv contrast with CTA. Baseline creatinine 1.14, 12/12/16 2. Generalized edema 3.  Acute respiratory failure.     Remains critically ill.  Urine output has decreased significantly  serum creatinine and BUN are worse likely from sepsis  Maintain hemodynamic support and avoid hypotension  Will dialyze patient again today  (3rd daily treatment) UF goal 3-4 kg as tolerated (CVP in 30's) We will continue to follow     LOS: [place] 7/7/20198:53 AM  [facility],  [place]  Note: This note was prepared with Dragon dictation. Any transcription errors are unintentional

## 2018-01-18 NOTE — Progress Notes (Signed)
ANTICOAGULATION CONSULT NOTE - Follow Up Consult  Pharmacy Consult for Heparin Drip Indication: atrial fibrillation  Allergies  Allergen Reactions  . No Known Allergies     Patient Measurements: Height: 5\' 9"  (175.3 cm) Weight: (!) 303 lb 12.7 oz (137.8 kg) IBW/kg (Calculated) : 70.7 Heparin Dosing Weight: 98.9 kg  Vital Signs: Temp: 98.4 F (36.9 C) (07/07 2000) Temp Source: Axillary (07/07 2000) BP: 98/54 (07/07 1438) Pulse Rate: 104 (07/07 2000)  Labs: Recent Labs    01/16/18 0756 01/17/18 0439  01/18/18 0507 01/18/18 1105 01/18/18 2028  HGB 10.3* 10.1*  --   --   --   --   HCT 33.9* 31.6*  --   --   --   --   PLT 343 339  --   --   --   --   HEPARINUNFRC 0.34  --    < > 0.25* 0.13* 0.60  CREATININE 3.36* 4.05*  --  4.54*  --   --    < > = values in this interval not displayed.    Estimated Creatinine Clearance: 26.5 mL/min (A) (by C-G formula based on SCr of 4.54 mg/dL (H)).  Pharmacy consulted for heparin drip management for 52 yo male admitted with tachyarrhythmias.   Heparin currently running at 1500 units/hr 7/7 20:00 Heparin level resulted at 0.60  Goal of Therapy:  Heparin level 0.3-0.7 units/ml Monitor platelets by anticoagulation protocol: Yes   Assessment/Plan:  Will continue with current rate of 1500 units/hr. Will recheck a confirmatory Heparin level in 6 hours. Daily CBC while on Heparin drip.  Clovia CuffLisa Hayslee Casebolt, PharmD, BCPS 01/18/2018 8:51 PM

## 2018-01-18 NOTE — Progress Notes (Signed)
CRITICAL CARE NOTE  CC  follow up respiratory failure  SUBJECTIVE Patient remains critically ill Prognosis is guarded Low o2 sats last night Almost coded +ileus TF on hold      SIGNIFICANT EVENTS    BP 119/62   Pulse (!) 106   Temp 98.7 F (37.1 C)   Resp (!) 25   Ht 5\' 9"  (1.753 m)   Wt (!) 303 lb 12.7 oz (137.8 kg)   SpO2 97%   BMI 44.86 kg/m    REVIEW OF SYSTEMS  PATIENT IS UNABLE TO PROVIDE COMPLETE REVIEW OF SYSTEM S DUE TO SEVERE CRITICAL ILLNESS AND ENCEPHALOPATHY   PHYSICAL EXAMINATION:  GENERAL:critically ill appearing, +resp distress HEAD: Normocephalic, atraumatic.  EYES: Pupils equal, round, reactive to light.  No scleral icterus.  MOUTH: Moist mucosal membrane. NECK: Supple. No thyromegaly. No nodules. No JVD.  PULMONARY: +rhonchi, +wheezing CARDIOVASCULAR: S1 and S2. Regular rate and rhythm. No murmurs, rubs, or gallops.  GASTROINTESTINAL: Soft, nontender, -distended. No masses. Positive bowel sounds. No hepatosplenomegaly.  MUSCULOSKELETAL: No swelling, clubbing, or edema.  NEUROLOGIC: obtunded, GCS<8 SKIN:intact,warm,dry  ASSESSMENT AND PLAN 52 yo morbidly obese white male with progressive resp failure with severe septic shock from probable acute viral syndrome in setting of metformin toxicity and sever acidosis, complicated by acute renal failure with difficulty weaning from vent complicated by ileus      Severe Hypoxic and Hypercapnic Respiratory Failure -continue Full MV support -continue Bronchodilator Therapy -unable to wean from vent today   Renal Failure-most likely due to ATN -follow chem 7 -follow UO -continue Foley Catheter-assess need IHD as needed per nephrology   NEUROLOGY - intubated and sedated - minimal sedation to achieve a RASS goal: -1    CARDIAC ICU monitoring  ID -continue IV abx as prescibed -follow up cultures   DVT/GI PRX ordered TRANSFUSIONS AS NEEDED MONITOR FSBS ASSESS the need for  LABS as needed   Critical Care Time devoted to patient care services described in this note is 34 minutes.   Overall, patient is critically ill, prognosis is guarded.  Patient with Multiorgan failure and at high risk for cardiac arrest and death.   Failure to wean from vent, prolonged ICU LOS Family to decide TRACH/PEG tube versus ONE way extubation/terminal wean  Palliative care team following   Lucie LeatherKurian David Hania Cerone, M.D.  [facility]  Medical Director [facility] Medical Director [facility]

## 2018-01-19 ENCOUNTER — Inpatient Hospital Stay: Payer: BLUE CROSS/BLUE SHIELD

## 2018-01-19 DIAGNOSIS — R4182 Altered mental status, unspecified: Secondary | ICD-10-CM

## 2018-01-19 LAB — RENAL FUNCTION PANEL
Albumin: 2.7 g/dL — ABNORMAL LOW (ref 3.5–5.0)
Anion gap: 16 — ABNORMAL HIGH (ref 5–15)
BUN: 122 mg/dL — AB (ref 6–20)
CALCIUM: 7.4 mg/dL — AB (ref 8.9–10.3)
CHLORIDE: 100 mmol/L (ref 98–111)
CO2: 23 mmol/L (ref 22–32)
Creatinine, Ser: 4.71 mg/dL — ABNORMAL HIGH (ref 0.61–1.24)
GFR calc non Af Amer: 13 mL/min — ABNORMAL LOW (ref >60)
GFR, EST AFRICAN AMERICAN: 15 mL/min — AB (ref >60)
GLUCOSE: 163 mg/dL — AB (ref 70–99)
Phosphorus: 5.5 mg/dL — ABNORMAL HIGH (ref 2.5–4.6)
Potassium: 4.7 mmol/L (ref 3.5–5.1)
SODIUM: 139 mmol/L (ref 135–145)

## 2018-01-19 LAB — GLUCOSE, CAPILLARY
GLUCOSE-CAPILLARY: 155 mg/dL — AB (ref 70–99)
GLUCOSE-CAPILLARY: 153 mg/dL — AB (ref 70–99)
Glucose-Capillary: 138 mg/dL — ABNORMAL HIGH (ref 70–99)
Glucose-Capillary: 142 mg/dL — ABNORMAL HIGH (ref 70–99)
Glucose-Capillary: 135 mg/dL — ABNORMAL HIGH (ref 70–99)

## 2018-01-19 LAB — CBC
HCT: 31.4 % — ABNORMAL LOW (ref 40.0–52.0)
HEMOGLOBIN: 10 g/dL — AB (ref 13.0–18.0)
MCH: 33.1 pg (ref 26.0–34.0)
MCHC: 31.7 g/dL — ABNORMAL LOW (ref 32.0–36.0)
MCV: 104.3 fL — ABNORMAL HIGH (ref 80.0–100.0)
PLATELETS: 323 10*3/uL (ref 150–440)
RBC: 3.01 MIL/uL — ABNORMAL LOW (ref 4.40–5.90)
RDW: 18.6 % — ABNORMAL HIGH (ref 11.5–14.5)
WBC: 20.8 10*3/uL — AB (ref 3.8–10.6)

## 2018-01-19 LAB — MAGNESIUM: MAGNESIUM: 2.7 mg/dL — AB (ref 1.7–2.4)

## 2018-01-19 LAB — HEPARIN LEVEL (UNFRACTIONATED): HEPARIN UNFRACTIONATED: 0.5 [IU]/mL (ref 0.30–0.70)

## 2018-01-19 MED ORDER — SODIUM CHLORIDE 0.9 % IV SOLN
2.0000 g | Freq: Every day | INTRAVENOUS | Status: DC
  Administered 2018-01-19 – 2018-01-20 (×2): 2 g via INTRAVENOUS
  Filled 2018-01-19 (×3): qty 2

## 2018-01-19 MED ORDER — VANCOMYCIN HCL 10 G IV SOLR
1750.0000 mg | Freq: Once | INTRAVENOUS | Status: AC
  Administered 2018-01-19: 1750 mg via INTRAVENOUS
  Filled 2018-01-19: qty 1750

## 2018-01-19 NOTE — Progress Notes (Signed)
HD tx start    01/19/18 0940  Vital Signs  Pulse Rate (!) 106  Pulse Rate Source Monitor  Resp (!) 21  BP 137/75  BP Location Other (Comment) (A-line)  BP Method Other (Comment) (Continuous)  Oxygen Therapy  SpO2 94 %  O2 Device Ventilator  End Tidal CO2 (EtCO2) 36  During Hemodialysis Assessment  Blood Flow Rate (mL/min) 300 mL/min  Arterial Pressure (mmHg) -80 mmHg  Venous Pressure (mmHg) 100 mmHg  Transmembrane Pressure (mmHg) 70 mmHg  Ultrafiltration Rate (mL/min) 710 mL/min  Dialysate Flow Rate (mL/min) 600 ml/min  Conductivity: Machine  14  HD Safety Checks Performed Yes  Dialysis Fluid Bolus Normal Saline  Bolus Amount (mL) 250 mL  Intra-Hemodialysis Comments Tx initiated  Hemodialysis Catheter Right Femoral vein Double-lumen  Placement Date/Time: 01/03/18 1200   Person Inserting Catheter: Dr. Peggye Pittichards  Orientation: Right  Access Location: Femoral vein  Hemodialysis Catheter Type: Double-lumen  Blue Lumen Status Infusing  Red Lumen Status Infusing

## 2018-01-19 NOTE — Progress Notes (Signed)
Pre HD assessment    01/19/18 0916  Neurological  Level of Consciousness Unresponsive  Orientation Level Intubated/Tracheostomy - Unable to assess  Respiratory  Bilateral Breath Sounds Diminished  Cardiac  Pulse Irregular  Cardiac Rhythm Atrial flutter;ST  Antiarrhythmic device No  Vascular  R Radial Pulse Doppler  L Radial Pulse Doppler  Edema Generalized;Right upper extremity;Left upper extremity;Right lower extremity;Left lower extremity;Facial;Perineal  Integumentary  Integumentary (WDL) X  Skin Color Pale  Musculoskeletal  Musculoskeletal (WDL) X  Generalized Weakness Yes  Assistive Device None  GU Assessment  Genitourinary (WDL) X  Genitourinary Symptoms  (HD)  Psychosocial  Psychosocial (WDL) X  Patient Behaviors Not interactive

## 2018-01-19 NOTE — Progress Notes (Signed)
eeg completed ° °

## 2018-01-19 NOTE — Progress Notes (Signed)
HD tx end    01/19/18 1315  Vital Signs  Pulse Rate 98  Pulse Rate Source Monitor  Resp (!) 25  BP 107/62  BP Location Other (Comment) (A-line)  BP Method Other (Comment) (Continuous)  Patient Position (if appropriate) Lying  Oxygen Therapy  SpO2 100 %  O2 Device Ventilator  End Tidal CO2 (EtCO2) 38  During Hemodialysis Assessment  Dialysis Fluid Bolus Normal Saline  Bolus Amount (mL) 250 mL  Intra-Hemodialysis Comments Tx completed

## 2018-01-19 NOTE — Progress Notes (Signed)
[facility] - [place] [facility]   PATIENT NAME: Ryan Webb    MR#:  [id number]  DATE OF BIRTH:  04/18/1966  SUBJECTIVE:   Patient remains critically ill. Started back on hemodialysis. critically ill REVIEW OF SYSTEMS:    unAble to obtain  tolerating Diet: Tube feeds DRUG ALLERGIES:   Allergies  Allergen Reactions  . No Known Allergies     VITALS:  Blood pressure 109/64, pulse 97, temperature 98.3 F (36.8 C), temperature source Axillary, resp. rate (!) 25, height 5\' 9"  (1.753 m), weight (!) 149.1 kg (328 lb 11.3 oz), SpO2 99 %.  PHYSICAL EXAMINATION:  Constitutional: Appears obese sedated on vent, critically ill, anasarca HENT: Normocephalic.  Intubated Eyes: , no scleral icterus.  Neck: Normal ROM. Neck supple. No JVD. No tracheal deviation. CVS: Tachycardic S1/S2 +, no murmurs, no gallops, no carotid bruit.  Pulmonary: Effort and breath sounds normal, no stridor, rhonchi, wheezes, rales.  Abdominal: Soft. BS +,  no distension, tenderness, rebound or guarding.  Musculoskeletal: sedated Neuro:sedated     . Skin: Skin is warm and dry. No rash noted.  4+ lower extremity edema Psychiatric: sedated  LABORATORY PANEL:   CBC Recent Labs  Lab 01/19/18 0417  WBC 20.8*  HGB 10.0*  HCT 31.4*  PLT 323   ------------------------------------------------------------------------------------------------------------------  Chemistries  Recent Labs  Lab 01/19/18 0417  NA 139  K 4.7  CL 100  CO2 23  GLUCOSE 163*  BUN 122*  CREATININE 4.71*  CALCIUM 7.4*  MG 2.7*   ------------------------------------------------------------------------------------------------------------------  Cardiac Enzymes No results for input(s): TROPONINI in the last 168 hours. ------------------------------------------------------------------------------------------------------------------  RADIOLOGY:  Dg Abd 1 View  Result Date: 01/18/2018 CLINICAL DATA:  Abdominal  distension EXAM: ABDOMEN - 1 VIEW COMPARISON:  01/15/2018, CT 05/05/2018 FINDINGS: Consolidation and pleural effusions at the bases. Esophageal tube tip and side port overlie the gastric body. Continued gaseous dilatation of the colon. Decreased small bowel gas. IMPRESSION: 1. Esophageal tube tip overlies the gastric body 2. Continued gaseous dilatation of the colon, possible ileus Electronically Signed   By: Jasmine PangKim  Fujinaga M.D.   On: 01/18/2018 00:56   Ct Head Wo Contrast  Result Date: 01/19/2018 CLINICAL DATA:  52 year old male is poorly responsive despite weaning fentanyl drip. Started hemodialysis. Low-grade fever. Encephalopathy. EXAM: CT HEAD WITHOUT CONTRAST TECHNIQUE: Contiguous axial images were obtained from the base of the skull through the vertex without intravenous contrast. COMPARISON:  Head CTs without contrast 01/14/2018 and earlier. FINDINGS: Brain: No midline shift, ventriculomegaly, mass effect, evidence of mass lesion, intracranial hemorrhage or evidence of cortically based acute infarction. Gray-white matter differentiation remains within normal limits throughout the brain. Vascular: Mild Calcified atherosclerosis at the skull base. No suspicious intracranial vascular hyperdensity. Tortuous basilar artery redemonstrated. Skull: No acute osseous abnormality identified. Sinuses/Orbits: Partial opacification of the right middle ear and mastoids has progressed since 01/14/2018 and is new compared to 01/03/2018. The other visible paranasal sinuses and mastoids are stable and well pneumatized. Other: The patient remains intubated on the scout view. Mildly Disconjugate gaze, otherwise negative orbits soft tissues. Visualized scalp soft tissues are within normal limits. IMPRESSION: 1. Stable and negative noncontrast CT appearance of the brain. 2. Right middle ear and mastoid fluid has progressed since 01/14/2018. This may be related to intubation. Otitis media is felt less likely. Electronically  Signed   By: Odessa FlemingH  Hall M.D.   On: 01/19/2018 14:24   Dg Chest Port 1 View  Result Date: 01/18/2018 CLINICAL DATA:  Abdominal  distension, intubated patient EXAM: PORTABLE CHEST 1 VIEW COMPARISON:  01/17/2018, 01/15/2018, CT chest 01/14/2018 FINDINGS: Endotracheal tube tip is about 4.5 cm superior to the carina. Esophageal tube tip extends below the diaphragm but is not included on the image. Right-sided central venous catheter tip overlies the SVC. Cardiomegaly with vascular congestion and mild inter lung edema. Moderate to large layering right pleural effusion and at least small left effusion. No change in bibasilar consolidations. Old left clavicle fracture IMPRESSION: 1. Endotracheal tube tip about 4.5 cm superior to the carina 2. Continued moderate likely layering right-sided pleural effusion with small moderate left pleural effusion. 3. Cardiomegaly with vascular congestion and underlying edema. No change in bibasilar consolidations. Electronically Signed   By: Jasmine Pang M.D.   On: 01/18/2018 00:54     ASSESSMENT AND PLAN:   52 year old male with obesity, diabetes, hypertension came in with tachyarrhythmia and was noted to be in septic shock  * Septic shock with multiorgan failure.- PNA -Continue vent management as per ICU -Sputum culture obtained from tracheal aspirate is positive for gram-positive cocci in clusters and gram-negative coccobacilli 6/22 -IV zyvox, zosyn and eraxis empirically---> changed to IV Diflucan.  -pt is s/o bronchoscopy with sputum cx no organism -Previous blood cultures obtained on 01/03/2018 are negative.  Urine culture obtained on 01/03/2018 is also negative -MRSA PCR is negative  * Hypernatremia with hypokalemia --resolved  * Ectopic atrial tachycardia versus atrial flutter-remains tachycardic CT Angio of the chest and pelvis negative for any PE or aortic dissection. Echocardiogram with LV ejection fraction of 45% which is stable from prior. Platelet  count is  better and patient is  On IV heparin drip.   -IV amiodarone gtt   per cardiolgoy  * Acute renal failure with metabolic acidosis due to ATN from septic shock and contrast exposure due to CT scans.  Baseline creatinine 1.14 Patient was taking metformin and TCA as an outpatient, had hemodialysis  6/28.   Management per nephrology creatinine upto 3.36--4.05--resumed HD again  *  Thrombocytopenia due to sepsis: Platelet count is increasing--back to normal Hit panel negative Last Platelets at 343,000  *Acute respiratory failure: Due to sepsis and multiorgan failure Remains on ventilator and management per ICU team  * Elevated LFTs due to shock liver with multiorgan failure due to sepsis.  LFTs are improving GI consultation appreciated  *  Nutrition: Continue tube feeds- vital Overall prognosis guarded.  Patient is critically ill.  Poor prognosis with high mortality and morbidity   CODE STATUS: FULL  TOTAL TIME TAKING CARE OF THIS PATIENT: 25 minutes.     POSSIBLE D/C ??, DEPENDING ON CLINICAL CONDITION.   Enedina Finner M.D on 01/19/2018 at 3:02 PM  Between 7am to 6pm - Pager - [phone] After 6pm go to [email or web address] - password [email or web address]  [facility]  Office  [phone]  CC: Primary care physician; Anola Gurney, PA  Note: This dictation was prepared with Dragon dictation along with smaller phrase technology. Any transcriptional errors that result from this process are unintentional.

## 2018-01-19 NOTE — Progress Notes (Signed)
Post HD assessment    01/19/18 1329  Neurological  Level of Consciousness Unresponsive  Orientation Level Intubated/Tracheostomy - Unable to assess  Respiratory  Respiratory Pattern Tachypnea  Bilateral Breath Sounds Diminished  Cardiac  Cardiac Rhythm ST  Vascular  Edema Generalized;Right upper extremity;Left upper extremity;Right lower extremity;Left lower extremity;Facial;Perineal  Integumentary  Integumentary (WDL) X  Skin Color Pale  Musculoskeletal  Musculoskeletal (WDL) X  Generalized Weakness Yes  Assistive Device None  GU Assessment  Genitourinary (WDL) X  Genitourinary Symptoms  (HD)  Psychosocial  Psychosocial (WDL) X  Patient Behaviors Not interactive

## 2018-01-19 NOTE — Progress Notes (Signed)
   01/19/18 1318  Clinical Encounter Type  Visited With Family;Health care provider  Visit Type Follow-up (page for advanced directives)   Chaplain received page regarding advanced directives.  Chaplain met with patient spouse and children in waiting room.  Spouse spoke of legal and financial concerns as to how to begin managing things with patient's extended hospitalization.  Chaplain provided education on difference between advanced directives and information she was seeking as well as who can make medical decisions for the patient by law when an advanced directive is not in place.  Chaplain utilized active and reflective listening as spouse reviewed what she was seeking to do.  Chaplain conferred with patient's nurse who placed a call to Education officer, museum.  Nurse relayed to chaplain that social worker would call back when she was able to meet with patient spouse.  Chaplain relayed information to patient's spouse.

## 2018-01-19 NOTE — Procedures (Signed)
ELECTROENCEPHALOGRAM REPORT   Patient: Ryan Webb       Room #: IC05A-AA EEG No. ID: [id number] Age: 52 y.o.        Sex: male Referring Physician: Kasa Report Date:  01/19/2018        Interpreting Physician: Thana FarrEYNOLDS, [name]  History: Ryan Webb is an 52 y.o. male with altered mental status  Medications:  Heparin, Pepcid, Duoneb, Amiodarone, Fentanyl, Versed  Conditions of Recording:  This is a 16 channel EEG carried out with the patient in the intubated and sedated state.  Description:  The background activity is slow and poorly organized.  It consists of a low voltage polymorphic delta activity at 1-2 Hz that is continuous and diffusely distributed over both hemispheres.  Despite stimulation no reactivity of the background rhythm is noted.   No epileptiform activity is noted. Hyperventilation and intermittent photic stimulation were not performed.  IMPRESSION: This is an abnormal EEG secondary to general background slowing.  This finding may be seen with a diffuse disturbance that is etiologically nonspecific, but may include a metabolic encephalopathy or medication side effects, among other possibilities.  No epileptiform activity was noted.     Thana FarrLeslie Mirel Hundal, MD Neurology [phone] 01/19/2018, 6:47 PM

## 2018-01-19 NOTE — Progress Notes (Signed)
ANTICOAGULATION CONSULT NOTE - Follow Up Consult  Pharmacy Consult for Heparin Drip Indication: atrial fibrillation  Allergies  Allergen Reactions  . No Known Allergies     Patient Measurements: Height: 5\' 9"  (175.3 cm) Weight: (!) 303 lb 12.7 oz (137.8 kg) IBW/kg (Calculated) : 70.7 Heparin Dosing Weight: 98.9 kg  Vital Signs: Temp: 98.9 F (37.2 C) (07/08 0000) Temp Source: Axillary (07/08 0000) Pulse Rate: 104 (07/08 0100)  Labs: Recent Labs    01/16/18 0756 01/17/18 0439  01/18/18 0507 01/18/18 1105 01/18/18 2028 01/19/18 0158  HGB 10.3* 10.1*  --   --   --   --   --   HCT 33.9* 31.6*  --   --   --   --   --   PLT 343 339  --   --   --   --   --   HEPARINUNFRC 0.34  --    < > 0.25* 0.13* 0.60 0.50  CREATININE 3.36* 4.05*  --  4.54*  --   --   --    < > = values in this interval not displayed.    Estimated Creatinine Clearance: 26.5 mL/min (A) (by C-G formula based on SCr of 4.54 mg/dL (H)).  Pharmacy consulted for heparin drip management for 52 yo male admitted with tachyarrhythmias.   Heparin currently running at 1500 units/hr 7/7 20:00 Heparin level resulted at 0.60  Goal of Therapy:  Heparin level 0.3-0.7 units/ml Monitor platelets by anticoagulation protocol: Yes   Assessment/Plan:  07/08 @ 0200 HL 0.50 therapeutic. Will continue at 1500 units/hr and will recheck next anti-Xa w/ am labs. Will f/u on next CBC.  Thomasene Rippleavid Gamal Todisco, PharmD, BCPS Clinical Pharmacist 01/19/2018

## 2018-01-19 NOTE — Progress Notes (Signed)
[place], [place] 01/19/18  Subjective:  Patient remains critically ill. Daily dialysis is being performed.   Dialysis planned for today as well. Remains on the vent, Fio2 35%.    Objective:  Vital signs in last 24 hours:  Temp:  [98.4 F (36.9 C)-100.8 F (38.2 C)] 100.8 F (38.2 C) (07/08 0806) Pulse Rate:  [69-109] 108 (07/08 0806) Resp:  [18-26] 25 (07/08 0806) BP: (93-120)/(50-66) 98/54 (07/07 1438) SpO2:  [95 %-100 %] 97 % (07/08 0806) FiO2 (%):  [30 %-35 %] 35 % (07/08 0734) Weight:  [137.9 kg (304 lb 0.2 oz)] 137.9 kg (304 lb 0.2 oz) (07/08 0418)  Weight change: -13.5 kg (-29 lb 12.2 oz) Filed Weights   01/17/18 1115 01/18/18 0146 01/19/18 0418  Weight: (!) 151.4 kg (333 lb 12.4 oz) (!) 137.8 kg (303 lb 12.7 oz) (!) 137.9 kg (304 lb 0.2 oz)    Intake/Output:    Intake/Output Summary (Last 24 hours) at 01/19/2018 0910 Last data filed at 01/19/2018 0801 Gross per 24 hour  Intake 2211.88 ml  Output 2580 ml  Net -368.12 ml     Physical Exam: General:  Critically ill-appearing, laying in the bed  HEENT  ET tube in place  Pulm/lungs  ventilator assisted,  Scattered rhonchi  CVS/Heart  S1S2 no rubs  Abdomen:   distended, scant BS  Extremities:  3+ generalized pitting edema  Neurologic:  Sedated  Skin:  cyanosis in b/l toes  Access: Rt femoral vascath   Foley catheter in place    Basic Metabolic Panel:  Recent Labs  Lab 01/15/18 0440 01/16/18 0756 01/17/18 0439 01/18/18 0507 01/19/18 0417  NA 148* 146* 141 140 139  K 4.1 5.0 4.7 5.0 4.7  CL 110 103 103 102 100  CO2 26 25 24 24 23   GLUCOSE 257* 249* 206* 215* 163*  BUN 139* 180* 176* 156* 122*  CREATININE 2.43* 3.36* 4.05* 4.54* 4.71*  CALCIUM 8.1* 8.0* 7.7* 7.7* 7.4*  MG 2.5* 3.4* 3.1* 2.8* 2.7*  PHOS 5.2* 5.4* 5.2* 6.2* 5.5*     CBC: Recent Labs  Lab 01/14/18 0205 01/15/18 0440 01/16/18 0756 01/17/18 0439 01/19/18 0417  WBC 9.9 15.3* 20.2* 24.5* 20.8*   HGB 10.3* 10.3* 10.3* 10.1* 10.0*  HCT 32.2* 32.7* 33.9* 31.6* 31.4*  MCV 103.7* 104.6* 106.8* 104.0* 104.3*  PLT 278 300 343 339 323      Lab Results  Component Value Date   HEPBSAG Negative 01/09/2018   HEPBSAB Non Reactive 01/09/2018   HEPBIGM Negative 01/03/2018      Microbiology:  Recent Results (from the past 240 hour(s))  CULTURE, BLOOD (ROUTINE X 2) w Reflex to ID Panel     Status: None   Collection Time: 01/10/18  9:35 AM  Result Value Ref Range Status   Specimen Description BLOOD L WRIST  Final   Special Requests   Final    BOTTLES DRAWN AEROBIC AND ANAEROBIC Blood Culture adequate volume   Culture   Final    NO GROWTH 5 DAYS Performed at [facility], [address] [place], [place] [address]    Report Status 01/15/2018 FINAL  Final  CULTURE, BLOOD (ROUTINE X 2) w Reflex to ID Panel     Status: None   Collection Time: 01/10/18  9:47 AM  Result Value Ref Range Status   Specimen Description BLOOD R FOREARM  Final   Special Requests   Final    BOTTLES DRAWN AEROBIC AND ANAEROBIC Blood Culture  adequate volume   Culture   Final    NO GROWTH 5 DAYS Performed at [facility], [address] [place], [place] [address]    Report Status 01/15/2018 FINAL  Final  Culture, respiratory (NON-Expectorated)     Status: None   Collection Time: 01/10/18 12:03 PM  Result Value Ref Range Status   Specimen Description   Final    TRACHEAL ASPIRATE Performed at [facility], [address] [place], [place] [address]    Special Requests   Final    NONE Performed at [facility], [address] [place], [place] [address]    Gram Stain   Final    ABUNDANT WBC PRESENT,BOTH PMN AND MONONUCLEAR RARE SQUAMOUS EPITHELIAL CELLS PRESENT RARE BUDDING YEAST SEEN Performed at [facility], [place] [address], [place], [place] [address]    Culture MODERATE CANDIDA ALBICANS  Final   Report Status 01/13/2018 FINAL  Final  Culture, blood  (Routine X 2) w Reflex to ID Panel     Status: None   Collection Time: 01/12/18 10:06 PM  Result Value Ref Range Status   Specimen Description BLOOD RIGHT HAND  Final   Special Requests   Final    BOTTLES DRAWN AEROBIC AND ANAEROBIC Blood Culture adequate volume   Culture   Final    NO GROWTH 5 DAYS Performed at [facility], [address] [place], [place] [address]    Report Status 01/17/2018 FINAL  Final  Culture, blood (Routine X 2) w Reflex to ID Panel     Status: None   Collection Time: 01/12/18 10:07 PM  Result Value Ref Range Status   Specimen Description BLOOD RIGHT ANTECUBITAL  Final   Special Requests   Final    BOTTLES DRAWN AEROBIC AND ANAEROBIC Blood Culture results may not be optimal due to an excessive volume of blood received in culture bottles   Culture   Final    NO GROWTH 5 DAYS Performed at [facility], [address] [place], [place] [address]    Report Status 01/17/2018 FINAL  Final  Culture, respiratory (NON-Expectorated)     Status: None   Collection Time: 01/13/18 12:28 AM  Result Value Ref Range Status   Specimen Description   Final    TRACHEAL ASPIRATE Performed at [facility], [address] [place], [place] [address]    Special Requests   Final    NONE Performed at [facility], [address] [place], [place] [address]    Gram Stain   Final    ABUNDANT WBC PRESENT, PREDOMINANTLY PMN NO ORGANISMS SEEN Performed at [facility], [place] [address] [place], [place] [address]    Culture FEW CANDIDA ALBICANS  Final   Report Status 01/15/2018 FINAL  Final  Urine Culture     Status: None   Collection Time: 01/13/18  3:50 AM  Result Value Ref Range Status   Specimen Description   Final    URINE, RANDOM Performed at [facility], [address] [place], [place] [address]    Special Requests   Final    NONE Performed at [facility], [address], [place], [place] [address]     Culture   Final    NO GROWTH Performed at [facility], [place] [address], [place], [place] [address]    Report Status 01/14/2018 FINAL  Final  Culture, respiratory (NON-Expectorated)     Status: None  Collection Time: 01/14/18 11:07 AM  Result Value Ref Range Status   Specimen Description   Final    TRACHEAL ASPIRATE Performed at [facility], [address], [place], [place] [address]    Special Requests   Final    NONE Performed at [facility], [address] [place], [place] [address]    Gram Stain   Final    RARE WBC PRESENT, PREDOMINANTLY PMN NO ORGANISMS SEEN Performed at [facility], [place] [address], [place], [place] [address]    Culture FEW CANDIDA ALBICANS  Final   Report Status 01/16/2018 FINAL  Final    Coagulation Studies: No results for input(s): LABPROT, INR in the last 72 hours.  Urinalysis: No results for input(s): COLORURINE, LABSPEC, PHURINE, GLUCOSEU, HGBUR, BILIRUBINUR, KETONESUR, PROTEINUR, UROBILINOGEN, NITRITE, LEUKOCYTESUR in the last 72 hours.  Invalid input(s): APPERANCEUR    Imaging: Dg Abd 1 View  Result Date: 01/18/2018 CLINICAL DATA:  Abdominal distension EXAM: ABDOMEN - 1 VIEW COMPARISON:  01/15/2018, CT 12/17/2017 FINDINGS: Consolidation and pleural effusions at the bases. Esophageal tube tip and side port overlie the gastric body. Continued gaseous dilatation of the colon. Decreased small bowel gas. IMPRESSION: 1. Esophageal tube tip overlies the gastric body 2. Continued gaseous dilatation of the colon, possible ileus Electronically Signed   By: Donavan Foil M.D.   On: 01/18/2018 00:56   Dg Chest Port 1 View  Result Date: 01/18/2018 CLINICAL DATA:  Abdominal distension, intubated patient EXAM: PORTABLE CHEST 1 VIEW COMPARISON:  01/17/2018, 01/15/2018, CT chest 01/14/2018 FINDINGS: Endotracheal tube tip is about 4.5 cm superior to the carina. Esophageal tube tip extends below the diaphragm but is not included  on the image. Right-sided central venous catheter tip overlies the SVC. Cardiomegaly with vascular congestion and mild inter lung edema. Moderate to large layering right pleural effusion and at least small left effusion. No change in bibasilar consolidations. Old left clavicle fracture IMPRESSION: 1. Endotracheal tube tip about 4.5 cm superior to the carina 2. Continued moderate likely layering right-sided pleural effusion with small moderate left pleural effusion. 3. Cardiomegaly with vascular congestion and underlying edema. No change in bibasilar consolidations. Electronically Signed   By: Donavan Foil M.D.   On: 01/18/2018 00:54     Medications:   . sodium chloride Stopped (01/11/18 1517)  . amiodarone 60 mg/hr (01/19/18 0700)  . anticoagulant sodium citrate    . famotidine (PEPCID) IV Stopped (01/18/18 2229)  . fentaNYL infusion INTRAVENOUS 50 mcg/hr (01/19/18 0801)  . fluconazole (DIFLUCAN) IV    . heparin 1,500 Units/hr (01/19/18 0700)   . chlorhexidine gluconate (MEDLINE KIT)  15 mL Mouth Rinse BID  . Chlorhexidine Gluconate Cloth  6 each Topical Q0600  . fentaNYL (SUBLIMAZE) injection  50 mcg Intravenous Once  . insulin aspart  0-15 Units Subcutaneous Q4H  . ipratropium-albuterol  3 mL Nebulization Q6H  . mouth rinse  15 mL Mouth Rinse 10 times per day  . polyvinyl alcohol  1 drop Both Eyes TID  . sodium chloride flush  10-40 mL Intracatheter Q12H  . sodium chloride flush  3 mL Intravenous Q12H   sodium chloride, acetaminophen **OR** acetaminophen, anticoagulant sodium citrate, bisacodyl, fentaNYL, iopamidol, ipratropium-albuterol, labetalol, midazolam, neomycin-bacitracin-polymyxin, sodium chloride flush, sodium chloride flush, vecuronium  Assessment/ Plan:  52 y.o. male with diabetes mellitus type 2, hypertension, chronic systolic heart failure ejection fraction 45%, who was admitted to [facility] on[facility]2019for evaluation of abdominal discomfort and bloating. Patient had  decompensation on January 03, 2018 with severe hypotension requiring multiple pressors, acute respiratory failure, shock liver and acute renal failure. Started CRRT from 6/22 to 6/28. Intermittent hemodialysis on 6/28. Trial of furosemide with good results on 6/29. Patient required paralytics during this hospitalization.   1. Acute renal failure secondary to severe hypotension,and also exposed to iv contrast with CTA. Baseline creatinine 1.14, 12/12/16 2. Generalized edema 3. Acute respiratory failure. 4.  Hypotension.      Plan:   Patient remains critically ill at the moment as he remains on the ventilator and continues to have significant acute renal failure.  He has been undergoing hemodialysis daily.  We do plan for dialysis again today.  Continue ultrafiltration with dialysis.  She remains relatively hypotensive as well with a blood pressure of 98/54 which will need to be monitored during dialysis.  Family considering withdrawal of care.  Further disposition as per pulmonary/critical care.     LOS: 16 Ami Mally 7/8/20199:10 AM  [place], [place]  Note: This note was prepared with Dragon dictation. Any transcription errors are unintentional

## 2018-01-19 NOTE — Progress Notes (Signed)
Pre HD assessment   01/19/18 0915  Vital Signs  Temp (!) 101.6 F (38.7 C)  Temp Source Axillary  Pulse Rate (!) 103  Pulse Rate Source Monitor  Resp (!) 21  BP 137/76  BP Location Other (Comment) (Arterial line)  BP Method Other (Comment) (Continuous)  Patient Position (if appropriate) Lying  Oxygen Therapy  SpO2 94 %  O2 Device Ventilator  End Tidal CO2 (EtCO2) 36  Critical Care Pain Observation Tool (CPOT)  Facial Expression 0  Body Movements 0  Muscle Tension 0  Compliance with ventilator (intubated pts.) 0  Vocalization (extubated pts.) N/A  CPOT Total 0  Dialysis Weight  Weight (!) 150.9 kg (332 lb 10.8 oz)  Type of Weight Pre-Dialysis  Time-Out for Hemodialysis  What Procedure? HD  Pt Identifiers(min of two) First/Last Name;MRN/Account#  Correct Site? Yes  Correct Side? Yes  Correct Procedure? Yes  Consents Verified? Yes  Rad Studies Available? N/A  Safety Precautions Reviewed? Yes  Biochemist, clinicalMachine Checks  Machine Number  (7A)  Station Number  (bedside, ICU 05)  UF/Alarm Test Passed  Conductivity: Meter 13.8  Conductivity: Machine  14  pH 7.6  Reverse Osmosis RO 5705  Normal Saline Lot Number [id number]  Dialyzer Lot Number [id number]  Disposable Set Lot Number [id number]  Machine Temperature 96.8 F (36 C)  ImmunologistAir Detector Armed and Audible Yes  Blood Lines Intact and Secured Yes  Pre Treatment Patient Checks  Vascular access used during treatment Catheter  Hepatitis B Surface Antigen Results Negative  Date Hepatitis B Surface Antigen Drawn 01/09/18  Hepatitis B Surface Antibody  (<10)  Date Hepatitis B Surface Antibody Drawn 01/09/18  Hemodialysis Consent Verified Yes  Hemodialysis Standing Orders Initiated Yes  ECG (Telemetry) Monitor On Yes  Prime Ordered Normal Saline  Length of  DialysisTreatment -hour(s) 3.5 Hour(s)  Dialyzer Elisio 17H NR  Dialysate 2K, 2.5 Ca  Dialysis Anticoagulant None  Dialysate Flow Ordered 600  Blood Flow Rate Ordered 300 mL/min   Ultrafiltration Goal 2 Liters  Dialysis Blood Pressure Support Ordered Normal Saline  Education / Care Plan  Dialysis Education Provided No (Comment) (pt sedated, on vent)  Hemodialysis Catheter Right Femoral vein Double-lumen  Placement Date/Time: 01/03/18 1200   Person Inserting Catheter: Dr. Peggye Pittichards  Orientation: Right  Access Location: Femoral vein  Hemodialysis Catheter Type: Double-lumen  Site Condition No complications  Blue Lumen Status Heparin locked  Red Lumen Status Heparin locked  Dressing Type Biopatch  Dressing Status Clean;Dry;Intact  Drainage Description None

## 2018-01-19 NOTE — Progress Notes (Signed)
Performed sedation vacation- pt coughs/gags and had pupil reaction- tries to open eyes when you call his name.  Weaned fentanyl drip to 50mcqs for patient comfort.  Placed patient in PS mode on ventilator- 15/5- 10 PEEP around 08:30.  Vitals stable- pt tolerated ventilator- but patient looked as if he was "tugging" to breathe.  Hemodialysis started- I waited to see if pulling fluid off of him would make his breathing look better- but it was the same- Updated Dr. Belia HemanKasa in rounds- he assessed the patient's breathing and told me to place patient back on full sedation.  He is also aware of patient's temp of 100.8- and patient has hypoactive bowel sounds- I asked if he wanted me to give anything down his OG-  He ordered no PO meds or tube feeds at this time.

## 2018-01-19 NOTE — Progress Notes (Signed)
[facility]  SUBJECTIVE: Ryan Webb is a 52 year old male with a history of coronary artery disease, type 2 Diabetes Mellitus, hypertension, and congestive heart failure who was admitted to [facility] on 01-24-18 for evaluation of abdominal pain and bloating. Patient went into respiratory failure which required endotracheal intubation.  Patient is currently intubated, unable to provide history.    Vitals:   01/19/18 1300 01/19/18 1315 01/19/18 1331 01/19/18 1332  BP: 110/62 107/62 109/64   Pulse: (!) 106 98 97   Resp: (!) 25 (!) 25 (!) 25   Temp:   98.3 F (36.8 C)   TempSrc:   Axillary   SpO2: 100% 100% 99% 99%  Weight:   (!) 149.1 kg (328 lb 11.3 oz)   Height:         Intake/Output Summary (Last 24 hours) at 01/19/2018 1351 Last data filed at 01/19/2018 1331 Gross per 24 hour  Intake 2364.8 ml  Output 4600 ml  Net -2235.2 ml      PHYSICAL EXAM  General: Critically ill, intubated  HEENT:  Normocephalic and atramatic Neck:  No JVD.  Lungs: Bilateral rhonchi  Heart: Irregular rate, irregular rhythm. No murmurs or gallops. No carotid bruits present.  Abdomen: Bowel sounds are present but hypoactive   Msk:  Gait not assessed  Extremities: 3+ pitting edema bilaterally  Neuro: Sedated Psych: Sedated   LABS: Basic Metabolic Panel: Recent Labs    01/18/18 0507 01/19/18 0417  NA 140 139  K 5.0 4.7  CL 102 100  CO2 24 23  GLUCOSE 215* 163*  BUN 156* 122*  CREATININE 4.54* 4.71*  CALCIUM 7.7* 7.4*  MG 2.8* 2.7*  PHOS 6.2* 5.5*   Liver Function Tests: Recent Labs    01/18/18 0507 01/19/18 0417  ALBUMIN 2.7* 2.7*   No results for input(s): LIPASE, AMYLASE in the last 72 hours. CBC: Recent Labs    01/17/18 0439 01/19/18 0417  WBC 24.5* 20.8*  HGB 10.1* 10.0*  HCT 31.6* 31.4*  MCV 104.0* 104.3*  PLT 339 323   Cardiac Enzymes: No results for input(s): CKTOTAL, CKMB, CKMBINDEX, TROPONINI in the last 72 hours. BNP: Invalid input(s): POCBNP D-Dimer: No  results for input(s): DDIMER in the last 72 hours. Hemoglobin A1C: No results for input(s): HGBA1C in the last 72 hours. Fasting Lipid Panel: No results for input(s): CHOL, HDL, LDLCALC, TRIG, CHOLHDL, LDLDIRECT in the last 72 hours. Thyroid Function Tests: No results for input(s): TSH, T4TOTAL, T3FREE, THYROIDAB in the last 72 hours.  Invalid input(s): FREET3 Anemia Panel: No results for input(s): VITAMINB12, FOLATE, FERRITIN, TIBC, IRON, RETICCTPCT in the last 72 hours.  Dg Abd 1 View  Result Date: 01/18/2018 CLINICAL DATA:  Abdominal distension EXAM: ABDOMEN - 1 VIEW COMPARISON:  01/15/2018, CT 01-24-18 FINDINGS: Consolidation and pleural effusions at the bases. Esophageal tube tip and side port overlie the gastric body. Continued gaseous dilatation of the colon. Decreased small bowel gas. IMPRESSION: 1. Esophageal tube tip overlies the gastric body 2. Continued gaseous dilatation of the colon, possible ileus Electronically Signed   By: Jasmine Pang M.D.   On: 01/18/2018 00:56   Dg Chest Port 1 View  Result Date: 01/18/2018 CLINICAL DATA:  Abdominal distension, intubated patient EXAM: PORTABLE CHEST 1 VIEW COMPARISON:  01/17/2018, 01/15/2018, CT chest 01/14/2018 FINDINGS: Endotracheal tube tip is about 4.5 cm superior to the carina. Esophageal tube tip extends below the diaphragm but is not included on the image. Right-sided central venous catheter tip overlies the SVC. Cardiomegaly  with vascular congestion and mild inter lung edema. Moderate to large layering right pleural effusion and at least small left effusion. No change in bibasilar consolidations. Old left clavicle fracture IMPRESSION: 1. Endotracheal tube tip about 4.5 cm superior to the carina 2. Continued moderate likely layering right-sided pleural effusion with small moderate left pleural effusion. 3. Cardiomegaly with vascular congestion and underlying edema. No change in bibasilar consolidations. Electronically Signed   By: Jasmine PangKim   Fujinaga M.D.   On: 01/18/2018 00:54     Echo completed on 01/03/18 revealed EF of 45-50%, mild to moderate MR, and moderate to severe TR.   TELEMETRY: Probably atrial flutter rate 105-110   ASSESSMENT AND PLAN:  Active Problems:   Tachyarrhythmia   Respiratory failure (HCC)   Shock (HCC)   Acute respiratory failure (HCC)   Transaminitis   Pressure injury of skin    1. Tachycardia, probable atrial flutter  - Rate and rhythm refractory to conventional therapy; intolerance  of beta-blockers   - Continue amiodarone drip; Consider changing to 400mg  p.o BID prior to discharge  - Consider transition to Eliquis 5mg  BID prior to discharge   2. Elevated Troponin (.06 on 6/22)  - Borderline elevation likely due to demand ischemia; no ECG ischemic changes noted  3. Acute Renal Failure (creatinine 4.71 on 7/8)  - Continue with nephrology plan   - Hemodialysis daily  4. Septic shock with multi-organ failure   - Continue ventilation management per ICU protocol    The physical exam findings and plan of care were discussed with Dr. Harold HedgeKenneth Linville Decarolis and all decision making was made in collaboration.      Andi HenceNicole L Stephens, PA-C 01/19/2018 1:51 PM    Pt seen and examined and assessment and plan. I agree with plan .

## 2018-01-19 NOTE — Progress Notes (Signed)
Post HD assessment. Pt tolerated tx well without complication. Net UF 2005, goal met.    01/19/18 1331  Hand-Off documentation  Report given to (Full Name) Kateri Mc  Report received from (Full Name) Stark Bray  Vital Signs  Temp 98.3 F (36.8 C)  Temp Source Axillary  Pulse Rate 97  Pulse Rate Source Monitor  Resp (!) 25  BP 109/64  BP Location Other (Comment) (A-line )  BP Method Other (Comment) (Continuous)  Patient Position (if appropriate) Lying  Oxygen Therapy  SpO2 99 %  O2 Device Ventilator  End Tidal CO2 (EtCO2) 36  Dialysis Weight  Weight (!) 149.1 kg (328 lb 11.3 oz)  Type of Weight Post-Dialysis  Post-Hemodialysis Assessment  Rinseback Volume (mL) 250 mL  KECN 60.6 V  Dialyzer Clearance Lightly streaked  Duration of HD Treatment -hour(s) 3.5 hour(s)  Hemodialysis Intake (mL) 500 mL  UF Total -Machine (mL) 2505 mL  Net UF (mL) 2005 mL  Tolerated HD Treatment Yes  Education / Care Plan  Dialysis Education Provided Yes  Documented Education in Care Plan Yes  Hemodialysis Catheter Right Femoral vein Double-lumen  Placement Date/Time: 01/03/18 1200   Person Inserting Catheter: Dr. Celesta Aver  Orientation: Right  Access Location: Femoral vein  Hemodialysis Catheter Type: Double-lumen  Site Condition No complications  Blue Lumen Status Heparin locked  Red Lumen Status Heparin locked  Purple Lumen Status N/A  Catheter fill solution Heparin 1000 units/ml  Catheter fill volume (Arterial) 1.3 cc  Catheter fill volume (Venous) 1.3  Dressing Type Biopatch  Dressing Status Clean;Dry;Intact  Drainage Description None  Post treatment catheter status Capped and Clamped

## 2018-01-19 NOTE — Consult Note (Signed)
Pharmacy Antibiotic Note  Ryan Webb is a 52 y.o. male admitted on 01/08/2018 with pneumonia and sepsis.  Pharmacy has been consulted for antibiotic dosing.Patient with renal failure, was on CRRT now started on intermittent HD. Patient to receive dialysis on 7/8.   Plan:  Patient with fevers overnight. Will obtain blood cultures x 2 and resume broad spectrum antibiotics.  Vancomycin 1750mg  IV x 1 followed by vancomycin 1000mg  IV with dialysis.   Cefepime 2g IV Q24hr.   Fluconazole  200mg  IV Q24hr.   Will follow cultures and narrow as warranted.    Height: 5\' 9"  (175.3 cm) Weight: (!) 328 lb 11.3 oz (149.1 kg) IBW/kg (Calculated) : 70.7  Temp (24hrs), Avg:99.5 F (37.5 C), Min:98.3 F (36.8 C), Max:101.6 F (38.7 C)  Recent Labs  Lab 01/13/18 0350  01/14/18 0205 01/14/18 0430 01/15/18 0440 01/16/18 0756 01/17/18 0439 01/18/18 0507 01/19/18 0417  WBC 7.5  --  9.9  --  15.3* 20.2* 24.5*  --  20.8*  CREATININE 1.99*   < >  --  2.30* 2.43* 3.36* 4.05* 4.54* 4.71*  VANCORANDOM 5  --   --  11  --   --   --   --   --    < > = values in this interval not displayed.    Estimated Creatinine Clearance: 26.8 mL/min (A) (by C-G formula based on SCr of 4.71 mg/dL (H)).    Allergies  Allergen Reactions  . No Known Allergies     Antimicrobials this admission: Zosyn 6/23 >> 7/5 vancomycin 6/29 >> 7/3, 7/8 >> linezolid 7/3 >> 7/5 anidulafungin 7/3 >> 7/5  Fluconazole 7/5 >>  Cefepime 7/8 >>   Dose adjustments this admission:   Microbiology results: 7/8 BCx: pending  7/3 TA: few candida albicans  7/2 TA: few candida albicans  7/2 UCx: no growth  7/1 BCx: no growth x 5 days  6/28 TA: moderate Candida albicans 6/22 BCx: no growth x 5 das  6/22 UCx: no growth  6/22 Sputum: normal respiratory flora  6/22 MRSA PCR: neg  Thank you for allowing pharmacy to be a part of this patient's care.  MLS 01/19/2018

## 2018-01-20 DIAGNOSIS — G9341 Metabolic encephalopathy: Secondary | ICD-10-CM

## 2018-01-20 DIAGNOSIS — Z01818 Encounter for other preprocedural examination: Secondary | ICD-10-CM

## 2018-01-20 LAB — CBC
HEMATOCRIT: 33.5 % — AB (ref 40.0–52.0)
HEMOGLOBIN: 10.5 g/dL — AB (ref 13.0–18.0)
MCH: 33 pg (ref 26.0–34.0)
MCHC: 31.5 g/dL — AB (ref 32.0–36.0)
MCV: 104.9 fL — AB (ref 80.0–100.0)
Platelets: 312 10*3/uL (ref 150–440)
RBC: 3.2 MIL/uL — ABNORMAL LOW (ref 4.40–5.90)
RDW: 18.8 % — AB (ref 11.5–14.5)
WBC: 22.1 10*3/uL — ABNORMAL HIGH (ref 3.8–10.6)

## 2018-01-20 LAB — GLUCOSE, CAPILLARY
GLUCOSE-CAPILLARY: 129 mg/dL — AB (ref 70–99)
GLUCOSE-CAPILLARY: 149 mg/dL — AB (ref 70–99)
Glucose-Capillary: 114 mg/dL — ABNORMAL HIGH (ref 70–99)
Glucose-Capillary: 148 mg/dL — ABNORMAL HIGH (ref 70–99)
Glucose-Capillary: 117 mg/dL — ABNORMAL HIGH (ref 70–99)
Glucose-Capillary: 104 mg/dL — ABNORMAL HIGH (ref 70–99)
Glucose-Capillary: 174 mg/dL — ABNORMAL HIGH (ref 70–99)

## 2018-01-20 LAB — RENAL FUNCTION PANEL
Albumin: 2.8 g/dL — ABNORMAL LOW (ref 3.5–5.0)
Anion gap: 14 (ref 5–15)
BUN: 95 mg/dL — AB (ref 6–20)
CHLORIDE: 101 mmol/L (ref 98–111)
CO2: 23 mmol/L (ref 22–32)
Calcium: 7.3 mg/dL — ABNORMAL LOW (ref 8.9–10.3)
Creatinine, Ser: 4.54 mg/dL — ABNORMAL HIGH (ref 0.61–1.24)
GFR calc Af Amer: 16 mL/min — ABNORMAL LOW (ref >60)
GFR calc non Af Amer: 14 mL/min — ABNORMAL LOW (ref >60)
Glucose, Bld: 152 mg/dL — ABNORMAL HIGH (ref 70–99)
POTASSIUM: 4.5 mmol/L (ref 3.5–5.1)
Phosphorus: 6.4 mg/dL — ABNORMAL HIGH (ref 2.5–4.6)
Sodium: 138 mmol/L (ref 135–145)

## 2018-01-20 LAB — MAGNESIUM: MAGNESIUM: 2.5 mg/dL — AB (ref 1.7–2.4)

## 2018-01-20 LAB — HEPARIN LEVEL (UNFRACTIONATED): Heparin Unfractionated: 0.47 IU/mL (ref 0.30–0.70)

## 2018-01-20 MED ORDER — VANCOMYCIN HCL IN DEXTROSE 1-5 GM/200ML-% IV SOLN
1000.0000 mg | Freq: Once | INTRAVENOUS | Status: AC
  Administered 2018-01-20: 1000 mg via INTRAVENOUS
  Filled 2018-01-20: qty 200

## 2018-01-20 NOTE — Progress Notes (Signed)
CRITICAL CARE NOTE  CC  follow up respiratory failure  SUBJECTIVE Patient remains critically ill Prognosis is guarded Febrile episodes Palliative care team to meet with family to discuss plan of care Follow up HD per nephrology     SIGNIFICANT EVENTS    BP 109/64 (BP Location: Other (Comment)) Comment (BP Location): A-line   Pulse (!) 102   Temp 99.7 F (37.6 C) (Axillary)   Resp (!) 26   Ht 5\' 9"  (1.753 m)   Wt (!) 328 lb 0.7 oz (148.8 kg)   SpO2 100%   BMI 48.44 kg/m    REVIEW OF SYSTEMS  PATIENT IS UNABLE TO PROVIDE COMPLETE REVIEW OF SYSTEM S DUE TO SEVERE CRITICAL ILLNESS AND ENCEPHALOPATHY   PHYSICAL EXAMINATION:  GENERAL:critically ill appearing, +resp distress HEAD: Normocephalic, atraumatic.  EYES: Pupils equal, round, reactive to light.  No scleral icterus.  MOUTH: Moist mucosal membrane. NECK: Supple. No thyromegaly. No nodules. No JVD.  PULMONARY: +rhonchi, +wheezing CARDIOVASCULAR: S1 and S2. Regular rate and rhythm. No murmurs, rubs, or gallops.  GASTROINTESTINAL: Soft, nontender, -distended. No masses. Positive bowel sounds. No hepatosplenomegaly.  MUSCULOSKELETAL: No swelling, clubbing, or edema.  NEUROLOGIC: obtunded, GCS<8 SKIN:intact,warm,dry   ASSESSMENT AND PLAN 52 yo morbidly obese white male with progressive resp failure with severe septic shock from probable acute viral syndrome in setting of metformin toxicity and sever acidosis, complicated by acute renal failure with difficulty weaning from ventcomplicated by ileus   Severe Hypoxic and Hypercapnic Respiratory Failure -continue Full MV support -continue Bronchodilator Therapy Unable to wean from vent   Patient needs Trach to survive-family to decide plan of care   Renal Failure-most likely due to ATN -follow chem 7 -follow UO -continue Foley Catheter-assess need Follow up nephrology consultation IHD as needed  NEUROLOGY - intubated and sedated - minimal sedation to  achieve a RASS goal: -1    CARDIAC ICU monitoring  ID -continue IV abx as prescibed -follow up cultures Repeat cultures pending   TF on hold-ileus NG to suction  DVT/GI PRX ordered TRANSFUSIONS AS NEEDED MONITOR FSBS ASSESS the need for LABS as needed   Critical Care Time devoted to patient care services described in this note is 34 minutes.   Overall, patient is critically ill, prognosis is guarded.  Patient with Multiorgan failure and at high risk for cardiac arrest and death.   Recommend Trach/PEG versus COMFORT CARE  Lucie LeatherKurian David Jerame Hedding, M.D.  [facility]  Medical Director [facility] Medical Director [facility] Card[facility]

## 2018-01-20 NOTE — Clinical Social Work Note (Signed)
Clinical Social Work Assessment  Patient Details  Name: Ryan Webb MRN: [id number] Date of Birth: 07/21/1965  Date of referral:  01/20/18               Reason for consult:  Discharge Planning                Permission sought to share information with:    Permission granted to share information::     Name::        Agency::     Relationship::     Contact Information:     Housing/Transportation Living arrangements for the past 2 months:  Single Family Home Source of Information:  Spouse Patient Interpreter Needed:  None Criminal Activity/Legal Involvement Pertinent to Current Situation/Hospitalization:  No - Comment as needed Significant Relationships:  Spouse Lives with:  Spouse Do you feel safe going back to the place where you live?    Need for family participation in patient care:     Care giving concerns:  Patient resides at home with his wife.   Social Worker assessment / plan:  CSW asked to contact patient's wife due to her having some financial questions. Patient is currently on a ventilator and sedated. CSW contacted patient's wife via phone this morning. Patient's wife wanted to know how she could get payment information for her husband's credit cards that he has in his own name. CSW advised she seek legal advice but that unless there is a power of attorney in place, then there would be no way to access that information. CSW advised her to call the credit card companies to let them know his condition and see what they advise her to do. CSW provided supportive listening and provided support as she processed whether or not she was going to pursue aggressive treatment or not. I discussed with her the discharge planning options should she choose aggressive care and the LTACH level of care versus the Vent/SNF/Dialysis level of care. Patient's wife is having a difficult time as expected with the decisions that she faces.  Employment status:    Insurance information:  Managed Care PT  Recommendations:    Information / Referral to community resources:     Patient/Family's Response to care:  Patient's wife expresses appreciation for CSW assistance.  Patient/Family's Understanding of and Emotional Response to Diagnosis, Current Treatment, and Prognosis:  Patient's wife was attentive to information provided and is coping as best as she can with the decisions that she is facing.  Emotional Assessment Appearance:    Attitude/Demeanor/Rapport:    Affect (typically observed):    Orientation:    Alcohol / Substance use:  Not Applicable Psych involvement (Current and /or in the community):  No (Comment)  Discharge Needs  Concerns to be addressed:  Care Coordination Readmission within the last 30 days:  No Current discharge risk:  None Barriers to Discharge:  Continued Medical Work up   [employer] CorporationMonica Kamron Vanwyhe, LCSW 01/20/2018, 3:45 PM

## 2018-01-20 NOTE — Progress Notes (Signed)
Subjective: Patient remains intubated and sedated.  Objective: Current vital signs: BP 129/67   Pulse 74   Temp 99.7 F (37.6 C) (Axillary)   Resp (!) 25   Ht _0  (1.753 m)   Wt (!) 148.8 kg (328 lb 0.7 oz)   SpO2 98%   BMI 48.44 kg/m  Vital signs in last 24 hours: Temp:  [98.4 F (36.9 C)-99.9 F (37.7 C)] 99.7 F (37.6 C) (07/09 0400) Pulse Rate:  [72-102] 74 (07/09 1445) Resp:  [14-27] 25 (07/09 1445) BP: (120-129)/(56-71) 129/67 (07/09 1445) SpO2:  [89 %-100 %] 98 % (07/09 1445) FiO2 (%):  [35 %] 35 % (07/09 1158) Weight:  [148.8 kg (328 lb 0.7 oz)] 148.8 kg (328 lb 0.7 oz) (07/09 1335)  Intake/Output from previous day: 07/08 0701 - 07/09 0700 In: 1861.8 [I.V.:1424.6; IV Piggyback:437.2] Out: 2092 [Urine:87] Intake/Output this shift: Total I/O In: 174.9 [I.V.:174.9] Out: -  Nutritional status:  Diet Order           Diet NPO time specified  Diet effective now          Neurologic Exam: Mental Status: Patient does not respond to verbal stimuli.  Does not respond to deep sternal rub.  Does not follow commands.  No verbalizations noted.  Cranial Nerves: II: patient does not respond confrontation bilaterally, pupils right 4 mm, left 4 mm,and reactie bilaterally III,IV,VI: doll's response present bilaterally.  V,VII: corneal reflex present bilaterally  VIII: patient does not respond to verbal stimuli IX,X: gag reflex reduced, XI: trapezius strength unable to test bilaterally XII: tongue strength unable to test Motor: Extremities flaccid throughout.  No spontaneous movement noted.  No purposeful movements noted. Sensory: Does not respond to noxious stimuli in any extremity. Deep Tendon Reflexes:  Absent throughout. Plantars: upgoing on the left, mute on the right Cerebellar: Unable to perform    Lab Results: Basic Metabolic Panel: Recent Labs  Lab 01/16/18 0756 01/17/18 0439 01/18/18 0507 01/19/18 0417 01/20/18 0402  NA 146* 141 140 139 138  K  5.0 4.7 5.0 4.7 4.5  CL 103 103 102 100 101  CO2 _1 GLUCOSE 249* 206* 215* 163* 152*  BUN 180* 176* 156* 122* 95*  CREATININE 3.36* 4.05* 4.54* 4.71* 4.54*  CALCIUM 8.0* 7.7* 7.7* 7.4* 7.3*  MG 3.4* 3.1* 2.8* 2.7* 2.5*  PHOS 5.4* 5.2* 6.2* 5.5* 6.4*    Liver Function Tests: Recent Labs  Lab 01/16/18 0756 01/17/18 0439 01/18/18 0507 01/19/18 0417 01/20/18 0402  ALBUMIN 2.8* 2.9* 2.7* 2.7* 2.8*   No results for input(s): LIPASE, AMYLASE in the last 168 hours. No results for input(s): AMMONIA in the last 168 hours.  CBC: Recent Labs  Lab 01/15/18 0440 01/16/18 0756 01/17/18 0439 01/19/18 0417 01/20/18 0402  WBC 15.3* 20.2* 24.5* 20.8* 22.1*  HGB 10.3* 10.3* 10.1* 10.0* 10.5*  HCT 32.7* 33.9* 31.6* 31.4* 33.5*  MCV 104.6* 106.8* 104.0* 104.3* 104.9*  PLT 300 343 339 323 312    Cardiac Enzymes: Recent Labs  Lab 01/14/18 1042  CKTOTAL 55    Lipid Panel: No results for input(s): CHOL, TRIG, HDL, CHOLHDL, VLDL, LDLCALC in the last 168 hours.  CBG: Recent Labs  Lab 01/19/18 1938 01/20/18 0026 01/20/18 0347 01/20/18 0708 01/20/18 1152  GLUCAP 135* 129* 114* 148* 117*    Microbiology: Results for orders placed or performed during the hospital encounter of 12/18/2017  Culture, blood (routine x 2)     Status: None   Collection  Time: 01/03/18 10:42 AM  Result Value Ref Range Status   Specimen Description BLOOD LT [facility]  Final   Special Requests   Final    BOTTLES DRAWN AEROBIC AND ANAEROBIC Blood Culture adequate volume   Culture   Final    NO GROWTH 5 DAYS Performed at [facility], [address] [place], [place] [address]    Report Status 01/08/2018 FINAL  Final  MRSA PCR Screening     Status: None   Collection Time: 01/03/18 10:56 AM  Result Value Ref Range Status   MRSA by PCR NEGATIVE NEGATIVE Final    Comment:        The GeneXpert MRSA Assay (FDA approved for NASAL specimens only), is one component of a comprehensive MRSA  colonization surveillance program. It is not intended to diagnose MRSA infection nor to guide or monitor treatment for MRSA infections. Performed at [facility], [address] [place], [place] [address]   CULTURE, BLOOD (ROUTINE X 2) w Reflex to ID Panel     Status: None   Collection Time: 01/03/18  1:25 PM  Result Value Ref Range Status   Specimen Description BLOOD LINE  Final   Special Requests   Final    BOTTLES DRAWN AEROBIC AND ANAEROBIC Blood Culture adequate volume   Culture   Final    NO GROWTH 5 DAYS Performed at [facility], [address] [place], [place] [address]    Report Status 01/08/2018 FINAL  Final  Culture, Urine     Status: None   Collection Time: 01/03/18  4:49 PM  Result Value Ref Range Status   Specimen Description   Final    URINE, RANDOM Performed at [facility], [address], [place], [place] [address]    Special Requests   Final    NONE Performed at [facility], [address] [place], [place] [address]    Culture   Final    NO GROWTH Performed at [facility], [place] [address] [place], [place] [address]    Report Status 01/04/2018 FINAL  Final  Culture, expectorated sputum-assessment     Status: None   Collection Time: 01/03/18  5:53 PM  Result Value Ref Range Status   Specimen Description TRACHEAL ASPIRATE  Final   Special Requests NONE  Final   Sputum evaluation   Final    THIS SPECIMEN IS ACCEPTABLE FOR SPUTUM CULTURE Performed at [facility], [address] [place], [place] [address]    Report Status 01/03/2018 FINAL  Final  Culture, respiratory (NON-Expectorated)     Status: None   Collection Time: 01/03/18  5:53 PM  Result Value Ref Range Status   Specimen Description   Final    TRACHEAL ASPIRATE Performed at [facility], [address] [place], [place] [address]    Special Requests   Final    NONE Reflexed from [id number] Performed at [facility], [address] [place], [place] [address]    Gram Stain   Final    FEW WBC PRESENT, PREDOMINANTLY PMN RARE SQUAMOUS EPITHELIAL CELLS PRESENT FEW GRAM POSITIVE COCCI IN CLUSTERS FEW GRAM NEGATIVE COCCOBACILLI    Culture   Final    Consistent with normal respiratory flora. Performed at [facility], [place] [address] [place], [place] [address]    Report Status 01/06/2018 FINAL  Final  CULTURE, BLOOD (ROUTINE X 2) w Reflex to ID Panel     Status: None  Collection Time: 01/10/18  9:35 AM  Result Value Ref Range Status   Specimen Description BLOOD L WRIST  Final   Special Requests   Final    BOTTLES DRAWN AEROBIC AND ANAEROBIC Blood Culture adequate volume   Culture   Final    NO GROWTH 5 DAYS Performed at [facility], [address] [place], [place] [address]    Report Status 01/15/2018 FINAL  Final  CULTURE, BLOOD (ROUTINE X 2) w Reflex to ID Panel     Status: None   Collection Time: 01/10/18  9:47 AM  Result Value Ref Range Status   Specimen Description BLOOD R FOREARM  Final   Special Requests   Final    BOTTLES DRAWN AEROBIC AND ANAEROBIC Blood Culture adequate volume   Culture   Final    NO GROWTH 5 DAYS Performed at [facility], [address] [place], [place] [address]    Report Status 01/15/2018 FINAL  Final  Culture, respiratory (NON-Expectorated)     Status: None   Collection Time: 01/10/18 12:03 PM  Result Value Ref Range Status   Specimen Description   Final    TRACHEAL ASPIRATE Performed at [facility], [address] [place], [place] [address]    Special Requests   Final    NONE Performed at [facility], [address], [place], [place] [address]    Gram Stain   Final    ABUNDANT WBC PRESENT,BOTH PMN AND MONONUCLEAR RARE SQUAMOUS EPITHELIAL CELLS PRESENT RARE BUDDING YEAST SEEN Performed at [facility], [place] [address] [place], [place] [address]    Culture MODERATE CANDIDA ALBICANS  Final    Report Status 01/13/2018 FINAL  Final  Culture, blood (Routine X 2) w Reflex to ID Panel     Status: None   Collection Time: 01/12/18 10:06 PM  Result Value Ref Range Status   Specimen Description BLOOD RIGHT HAND  Final   Special Requests   Final    BOTTLES DRAWN AEROBIC AND ANAEROBIC Blood Culture adequate volume   Culture   Final    NO GROWTH 5 DAYS Performed at [facility], [address] [place], [place] [address]    Report Status 01/17/2018 FINAL  Final  Culture, blood (Routine X 2) w Reflex to ID Panel     Status: None   Collection Time: 01/12/18 10:07 PM  Result Value Ref Range Status   Specimen Description BLOOD RIGHT ANTECUBITAL  Final   Special Requests   Final    BOTTLES DRAWN AEROBIC AND ANAEROBIC Blood Culture results may not be optimal due to an excessive volume of blood received in culture bottles   Culture   Final    NO GROWTH 5 DAYS Performed at [facility], [address], [place], [place] [address]    Report Status 01/17/2018 FINAL  Final  Culture, respiratory (NON-Expectorated)     Status: None   Collection Time: 01/13/18 12:28 AM  Result Value Ref Range Status   Specimen Description   Final    TRACHEAL ASPIRATE Performed at [facility], [address], [place], [place] [address]    Special Requests   Final    NONE Performed at [facility], [address] [place], [place] [address]    Gram Stain   Final    ABUNDANT WBC PRESENT, PREDOMINANTLY PMN NO ORGANISMS SEEN Performed at [facility], [place] [address], [place], [place] [address]    Culture  FEW CANDIDA ALBICANS  Final   Report Status 01/15/2018 FINAL  Final  Urine Culture     Status: None   Collection Time: 01/13/18  3:50 AM  Result Value Ref Range Status   Specimen Description   Final    URINE, RANDOM Performed at [facility], [address], [place], [place] [address]    Special Requests   Final    NONE Performed at [facility], [address] [place], [place] [address]    Culture   Final    NO GROWTH Performed at [facility], [place] [address] [place], [place] [address]    Report Status 01/14/2018 FINAL  Final  Culture, respiratory (NON-Expectorated)     Status: None   Collection Time: 01/14/18 11:07 AM  Result Value Ref Range Status   Specimen Description   Final    TRACHEAL ASPIRATE Performed at [facility], [address] [place], [place] [address]    Special Requests   Final    NONE Performed at [facility], [address] [place], [place] [address]    Gram Stain   Final    RARE WBC PRESENT, PREDOMINANTLY PMN NO ORGANISMS SEEN Performed at [facility], [place] [address] [place], [place] [address]    Culture FEW CANDIDA ALBICANS  Final   Report Status 01/16/2018 FINAL  Final  CULTURE, BLOOD (ROUTINE X 2) w Reflex to ID Panel     Status: None (Preliminary result)   Collection Time: 01/19/18  2:46 PM  Result Value Ref Range Status   Specimen Description BLOOD HAND  Final   Special Requests Blood Culture adequate volume  Final   Culture   Final    NO GROWTH < 24 HOURS Performed at [facility], [address] [place], [place] [address]    Report Status PENDING  Incomplete  CULTURE, BLOOD (ROUTINE X 2) w Reflex to ID Panel     Status: None (Preliminary result)   Collection Time: 01/19/18  4:25 PM  Result Value Ref Range Status   Specimen Description BLOOD LEFT HAND  Final   Special Requests   Final    BOTTLES DRAWN AEROBIC AND ANAEROBIC Blood Culture adequate volume   Culture   Final    NO GROWTH < 24 HOURS Performed at [facility], [address], [place], [place] [address]    Report Status PENDING  Incomplete    Coagulation Studies: No results for input(s): LABPROT, INR in the last 72 hours.  Imaging: Ct Head Wo Contrast  Result Date: 01/19/2018 CLINICAL DATA:  52 year old male is poorly responsive despite weaning  fentanyl drip. Started hemodialysis. Low-grade fever. Encephalopathy. EXAM: CT HEAD WITHOUT CONTRAST TECHNIQUE: Contiguous axial images were obtained from the base of the skull through the vertex without intravenous contrast. COMPARISON:  Head CTs without contrast 01/14/2018 and earlier. FINDINGS: Brain: No midline shift, ventriculomegaly, mass effect, evidence of mass lesion, intracranial hemorrhage or evidence of cortically based acute infarction. Gray-white matter differentiation remains within normal limits throughout the brain. Vascular: Mild Calcified atherosclerosis at the skull base. No suspicious intracranial vascular hyperdensity. Tortuous basilar artery redemonstrated. Skull: No acute osseous abnormality identified. Sinuses/Orbits: Partial opacification of the right middle ear and mastoids has progressed since 01/14/2018 and is new compared to 01/03/2018. The other visible paranasal sinuses and mastoids are stable and well pneumatized. Other: The patient remains intubated on the scout view. Mildly Disconjugate gaze, otherwise negative orbits soft tissues. Visualized scalp  soft tissues are within normal limits. IMPRESSION: 1. Stable and negative noncontrast CT appearance of the brain. 2. Right middle ear and mastoid fluid has progressed since 01/14/2018. This may be related to intubation. Otitis media is felt less likely. Electronically Signed   By: Genevie Ann M.D.   On: 01/19/2018 14:24    Medications:  I have reviewed the patient's current medications. Scheduled: . chlorhexidine gluconate (MEDLINE KIT)  15 mL Mouth Rinse BID  . Chlorhexidine Gluconate Cloth  6 each Topical Q0600  . insulin aspart  0-15 Units Subcutaneous Q4H  . ipratropium-albuterol  3 mL Nebulization Q6H  . mouth rinse  15 mL Mouth Rinse 10 times per day  . polyvinyl alcohol  1 drop Both Eyes TID  . sodium chloride flush  10-40 mL Intracatheter Q12H  . sodium chloride flush  3 mL Intravenous Q12H     Assessment/Plan: Patient remains altered with multiple medical and metabolic factors likely precipitating current presentation.  EEG only significant for slowing.  Repeat head CT reviewed and unremarkable.  Despite patient not having signs consistent with brain death, the medical issues do suggest a possible poor prognosis.     LOS: 17 days   Alexis Goodell, MD Neurology [phone] 01/20/2018  3:01 PM

## 2018-01-20 NOTE — Progress Notes (Signed)
Nutrition Follow-up  DOCUMENTATION CODES:   Morbid obesity  INTERVENTION:  Tube feeds have been on hold since 7/5 with OGT to LIS. However, per I/O documentation patient has had only 0-40 mL output from OGT every day, which is not significant.  If plan is to continue medical care, recommend resuming tube feeds. Can consider addition of bowel regimen and prokinetic agent to promote tolerance. Can also consider placement of small-bore feeding tube that terminates in jejunum.  If tube feeds are resumed recommend initiating Vital 1.5 at 20 mL/hr and advancing by 20 mL/hr every 8 hours to goal regimen of Vital 1.5 at 60 mL/hr (1440 mL goal daily volume) + Pro-Stat 30 mL TID via OGT. Goal regimen provides 2460 kcal, 142 grams of protein, 1094 mL H2O daily.  If tube feeds are resumed also recommend resuming Juven BID per tube to help promote wound healing.  NUTRITION DIAGNOSIS:   Inadequate oral intake related to inability to eat as evidenced by NPO status.  Ongoing.  GOAL:   Provide needs based on ASPEN/SCCM guidelines  Not met.  MONITOR:   Vent status, Labs, Weight trends, TF tolerance, I & O's  REASON FOR ASSESSMENT:   Ventilator, Consult Enteral/tube feeding initiation and management  ASSESSMENT:   52 year old male with PMHx of DM type 2, HTN who initially presented with a few days history of abdominal discomfort and bloating, was found to be tachycardic in ER, later developed worsening respiratory distress requiring mechanical intubation on 6/22, also found to have severe sepsis with profound shock, severe lactic acidosis, acute renal failure on CVVHD, acute hepatic failure.   -CVVHD was stopped on 6/28. Underwent intermittent HD on 6/28. -On early AM of 7/1 patient bit through ETT and it had to be replaced. Bite block was placed. -Underwent bronchoscopy on 7/3. -Underwent intermittent HD on 7/5 (500 mL UF), 7/6 (500 mL UF), 7/7 (2.5 L UF), 7/8 (2.005 L UF), and planned for  today (goal is 2.5 L UF). -Family is deciding on continued medical care versus comfort care measures. Wife has stated that patient would not want a tracheostomy tube. PMT and MD have been discussing Glenn with patient's family.  Patient remains intubated and sedated. On PRVC mode with FiO2 35% at time of RD assessment. Per review of chart tube feeds were held on 7/5 at 1642 and were discontinued on 7/7 at 0340. Patient has now been 4 days without nutrition. OGT has been to LIS this whole time, but there has been very little output documented. Most days had 0 mL output from OGT and on 7/8 had only 40 mL output documented, which is not significant over a 24 hour period.  Access: 16 Fr. OGT placed 6/22; terminates in stomach per chest x-ray on 6/22; tube marked at lip  TF: on hold  Patient is currently intubated on ventilator support Ve: 12.3 L/min Temp (24hrs), Avg:99.2 F (37.3 C), Min:98.4 F (36.9 C), Max:99.9 F (37.7 C)  Propofol: N/A  Medications reviewed and include: Novolog 0-15 units Q4hrs, amiodarone gtt, cefepime, famotidine, fentanyl gtt, fluconazole, heparin gtt.  Labs reviewed: CBG 114-148, BUN 95, Creatinine 4.54, Phosphorus 6.4, Magnesium 2.5.  I/O: only 87 mL UOP yesterday; 0 mL output from OGT yesterday  Weight trend: 148.8 kg on 7/9; +25.4 kg from admission  Discussed with RN and on rounds.  Diet Order:   Diet Order           Diet NPO time specified  Diet effective now  EDUCATION NEEDS:   No education needs have been identified at this time  Skin:  Skin Assessment: Skin Integrity Issues: Skin Integrity Issues:: Stage II Stage II: left nose (scabbed) and right face (non-healing)  Last BM:  01/13/2018 - large type 7  Height:   Ht Readings from Last 1 Encounters:  01/13/18 5' 9"  (1.753 m)    Weight:   Wt Readings from Last 1 Encounters:  01/20/18 (!) 328 lb 0.7 oz (148.8 kg)    Ideal Body Weight:  72.7 kg  BMI:  Body mass index is 48.44  kg/m.  Estimated Nutritional Needs:   Kcal:  2464 (PSU 2003b w/ MSJ 2082, Ve 12.3, Tmax 37.7)  Protein:  125-145 grams (1.7-2 grams/kg IBW)  Fluid:  UOP + 1L  Willey Blade, MS, RD, LDN Office: [phone] Pager: [phone] After Hours/Weekend Pager: [phone]

## 2018-01-20 NOTE — Progress Notes (Signed)
ANTICOAGULATION CONSULT NOTE - Follow Up Consult  Pharmacy Consult for Heparin Drip Indication: atrial fibrillation  Allergies  Allergen Reactions  . No Known Allergies     Patient Measurements: Height: 5\' 9"  (175.3 cm) Weight: (!) 328 lb 0.7 oz (148.8 kg) IBW/kg (Calculated) : 70.7 Heparin Dosing Weight: 98.9 kg  Vital Signs: Temp: 99.7 F (37.6 C) (07/09 0400) Temp Source: Axillary (07/09 0400) Pulse Rate: 101 (07/09 0400)  Labs: Recent Labs    01/18/18 0507  01/18/18 2028 01/19/18 0158 01/19/18 0417 01/20/18 0402  HGB  --   --   --   --  10.0* 10.5*  HCT  --   --   --   --  31.4* 33.5*  PLT  --   --   --   --  323 312  HEPARINUNFRC 0.25*   < > 0.60 0.50  --  0.47  CREATININE 4.54*  --   --   --  4.71* 4.54*   < > = values in this interval not displayed.    Estimated Creatinine Clearance: 27.7 mL/min (A) (by C-G formula based on SCr of 4.54 mg/dL (H)).  Pharmacy consulted for heparin drip management for 52 yo male admitted with tachyarrhythmias.   Heparin currently running at 1500 units/hr 7/7 20:00 Heparin level resulted at 0.60  Goal of Therapy:  Heparin level 0.3-0.7 units/ml Monitor platelets by anticoagulation protocol: Yes   Assessment/Plan:  07/08 @ 0200 HL 0.50 therapeutic. Will continue at 1500 units/hr and will recheck next anti-Xa w/ am labs. Will f/u on next CBC.  07/09 @ 0400 HL = 0.47   Will continue this pt on current rate and recheck HL on 7/10 with AM labs.   Ahmeer Tuman D Clinical Pharmacist 01/20/2018

## 2018-01-20 NOTE — Progress Notes (Signed)
Daily Progress Note   Patient Name: Ryan Webb       Date: 01/20/2018 DOB: 14-Oct-1965  Age: 52 y.o. MRN#: [id number] Attending Physician: Fritzi Mandes, MD Primary Care Physician: Carmon Ginsberg, [place] Admit Date: 12/17/2017  Reason for Consultation/Follow-up: Establishing goals of care  Subjective:  Met with wife. We discussed his status. Mr. Lona is requiring dialysis. She states he would not want to be kept alive on machines and tubes. She discusses the beginnings of his decline prior to this hospitalization, and some of his decisions. She states that he loves [employer]eps[employer], which he did not want to stop drinking with diabetes. He stated to her he has lived a good life. She is leaning toward comfort care in the next day or so. She knows he would want to be made comfortable, but she is nervous to make the decision. She would like to speak with her family. Dr. Mortimer Fries entered conversation with palliative.  Length of Stay: 17  Current Medications: Scheduled Meds:  . chlorhexidine gluconate (MEDLINE KIT)  15 mL Mouth Rinse BID  . Chlorhexidine Gluconate Cloth  6 each Topical Q0600  . insulin aspart  0-15 Units Subcutaneous Q4H  . ipratropium-albuterol  3 mL Nebulization Q6H  . mouth rinse  15 mL Mouth Rinse 10 times per day  . polyvinyl alcohol  1 drop Both Eyes TID  . sodium chloride flush  10-40 mL Intracatheter Q12H  . sodium chloride flush  3 mL Intravenous Q12H    Continuous Infusions: . sodium chloride Stopped (01/11/18 1517)  . amiodarone 60 mg/hr (01/20/18 0600)  . anticoagulant sodium citrate    . ceFEPime (MAXIPIME) IV Stopped (01/19/18 1819)  . famotidine (PEPCID) IV 20 mg (01/20/18 1055)  . fentaNYL infusion INTRAVENOUS 100 mcg/hr (01/20/18 0600)  . fluconazole (DIFLUCAN) IV Stopped  (01/19/18 2103)  . heparin 1,500 Units/hr (01/20/18 0600)    PRN Meds: sodium chloride, acetaminophen **OR** acetaminophen, anticoagulant sodium citrate, bisacodyl, fentaNYL, iopamidol, ipratropium-albuterol, labetalol, midazolam, neomycin-bacitracin-polymyxin, sodium chloride flush, sodium chloride flush, vecuronium  Physical Exam  Constitutional: No distress.  Pulmonary/Chest:  Vent  Neurological: He is alert.  Skin: Skin is warm and dry.            Vital Signs: BP 109/64 (BP Location: Other (Comment)) Comment (BP Location): A-line   Pulse (!) 102  Temp 99.7 F (37.6 C) (Axillary)   Resp (!) 22   Ht 5' 9"  (1.753 m)   Wt (!) 148.8 kg (328 lb 0.7 oz)   SpO2 100%   BMI 48.44 kg/m  SpO2: SpO2: 100 % O2 Device: O2 Device: Ventilator O2 Flow Rate:    Intake/output summary:   Intake/Output Summary (Last 24 hours) at 01/20/2018 1255 Last data filed at 01/20/2018 0900 Gross per 24 hour  Intake 1863.78 ml  Output 2077 ml  Net -213.22 ml   LBM: Last BM Date: 01/14/18 Baseline Weight: Weight: 122.5 kg (270 lb) Most recent weight: Weight: (!) 148.8 kg (328 lb 0.7 oz)       Palliative Assessment/Data: 10%      Patient Active Problem List   Diagnosis Date Noted  . Pressure injury of skin 01/10/2018  . Transaminitis   . Acute respiratory failure ([place]) 01/03/2018  . Respiratory failure ([place])   . Shock ([place])   . Tachyarrhythmia 01/09/2018  . Diabetes mellitus without complication ([place]) 25/00/3704  . Cardiomyopathy ([place]) 01/17/2016  . Coronary artery disease 01/17/2016  . Breathlessness on exertion 01/17/2016  . Acid reflux 01/17/2016  . H/O: HTN (hypertension) 01/17/2016  . 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 Prostatitis 01/17/2016    Palliative Care Assessment & Plan   Patient Profile: Demichael Traum Woodsis an 52 y.o.malewithpast medical history of hypertension, diabetes, tachycardia,admitted initiallyon 6/21for abdominal pain, noted to have  severe acidosis, possibly related to metformin use,respiratory failure, renal failure, now with prolonged mechanical ventilation.  Assessment/ Recommendations/Plan:   Leaning toward 1 way extubation instead of trach this week.   Code Status:    Code Status Orders  (From admission, onward)        Start     Ordered   12/22/2017 1742  Full code  Continuous     01/05/2018 1742    Code Status History    This patient has a current code status but no historical code status.       Prognosis:  Poor.   Discharge Planning:  To Be Determined    Thank you for allowing the Palliative Medicine Team to assist in the care of this patient.   Total Time 10:10-11:20 70 min min Prolonged Time Billed yes      Greater than 50%  of this time was spent counseling and coordinating care related to the above assessment and plan.  Asencion Gowda, NP  Please contact Palliative Medicine Team phone at [phone] for questions and concerns.

## 2018-01-20 NOTE — Progress Notes (Signed)
Pts wife requested to talk about pts code status she would like the pts code status to be changed to DO NOT RESUSCITATE, therefore code status changed to DNR.  Sonda Rumbleana Andilyn Bettcher, AGNP  Pulmonary/Critical Care Pager [phone] (please enter 7 digits) PCCM Consult Pager [phone] (please enter 7 digits)

## 2018-01-20 NOTE — Consult Note (Signed)
Pharmacy Antibiotic Note  Ryan Webb is a 52 y.o. male admitted on 12/15/2017 with pneumonia and sepsis.  Pharmacy has been consulted for antibiotic dosing.Patient with renal failure, was on CRRT now started on intermittent HD. Patient to receive dialysis on 7/9.   Plan:  Vancomycin 1g IV x 1 post dialysis. (Patient received loading dose on 7/8).   Cefepime 2g IV Q24hr.   Fluconazole  200mg  IV Q24hr.   Will follow cultures and narrow as warranted.    Height: 5\' 9"  (175.3 cm) Weight: (!) 328 lb 0.7 oz (148.8 kg) IBW/kg (Calculated) : 70.7  Temp (24hrs), Avg:99.4 F (37.4 C), Min:98.7 F (37.1 C), Max:99.9 F (37.7 C)  Recent Labs  Lab 01/14/18 0430 01/15/18 0440 01/16/18 0756 01/17/18 0439 01/18/18 0507 01/19/18 0417 01/20/18 0402  WBC  --  15.3* 20.2* 24.5*  --  20.8* 22.1*  CREATININE 2.30* 2.43* 3.36* 4.05* 4.54* 4.71* 4.54*  VANCORANDOM 11  --   --   --   --   --   --     Estimated Creatinine Clearance: 27.7 mL/min (A) (by C-G formula based on SCr of 4.54 mg/dL (H)).    Allergies  Allergen Reactions  . No Known Allergies     Antimicrobials this admission: Zosyn 6/23 >> 7/5 vancomycin 6/29 >> 7/3, 7/8 >> linezolid 7/3 >> 7/5 anidulafungin 7/3 >> 7/5  Fluconazole 7/5 >>  Cefepime 7/8 >>   Dose adjustments this admission: N/A   Microbiology results: 7/8 BCx: no growth < 24 hours.  7/3 TA: few candida albicans  7/2 TA: few candida albicans  7/2 UCx: no growth  7/1 BCx: no growth x 5 days  6/28 TA: moderate Candida albicans 6/22 BCx: no growth x 5 das  6/22 UCx: no growth  6/22 Sputum: normal respiratory flora  6/22 MRSA PCR: neg  Thank you for allowing pharmacy to be a part of this patient's care.  MLS 01/20/2018

## 2018-01-20 NOTE — Progress Notes (Signed)
   01/20/18 1710  Vital Signs  Temp 99.1 F (37.3 C)  Temp Source Oral  Pulse Rate 86  Pulse Rate Source Monitor  Resp (!) 23  BP 124/69  Oxygen Therapy  SpO2 99 %  End Tidal CO2 (EtCO2) 37  Post-Hemodialysis Assessment  Rinseback Volume (mL) 250 mL  Dialyzer Clearance Lightly streaked  Duration of HD Treatment -hour(s) 3.5 hour(s)  Hemodialysis Intake (mL) 500 mL  UF Total -Machine (mL) 3000 mL  Net UF (mL) 2500 mL  Tolerated HD Treatment Yes  Post-Hemodialysis Comments report given to icu rn  Education / Care Plan  Dialysis Education Provided Yes  Hemodialysis Catheter Right Femoral vein Double-lumen  Placement Date/Time: 01/03/18 1200   Person Inserting Catheter: Dr. Peggye Pittichards  Orientation: Right  Access Location: Femoral vein  Hemodialysis Catheter Type: Double-lumen  Site Condition No complications  Blue Lumen Status Capped (Central line);Heparin locked  Red Lumen Status Capped (Central line);Heparin locked  Catheter fill solution Heparin 1000 units/ml  Catheter fill volume (Arterial) 1.3 cc  Catheter fill volume (Venous) 1.3  Dressing Status Clean;Dry;Intact  Post treatment catheter status Capped and Clamped

## 2018-01-20 NOTE — Progress Notes (Signed)
[employer] [facility] [place] [facility]   PATIENT NAME: Ryan Webb    MR#:  [id number]  DATE OF BIRTH:  [birth year]  SUBJECTIVE:   Patient remains critically ill. Started back on hemodialysis. critically ill  REVIEW OF SYSTEMS:    unAble to obtain  tolerating Diet: Tube feeds DRUG ALLERGIES:   Allergies  Allergen Reactions  . No Known Allergies     VITALS:  Blood pressure 109/64, pulse (!) 102, temperature 99.7 F (37.6 C), temperature source Axillary, resp. rate (!) 22, height 5\' 9"  (1.753 m), weight (!) 148.8 kg (328 lb 0.7 oz), SpO2 100 %.  PHYSICAL EXAMINATION:  Constitutional: Appears obese sedated on vent, critically ill, anasarca HENT: Normocephalic.  Intubated Eyes: , no scleral icterus.  Neck: Normal ROM. Neck supple. No JVD. No tracheal deviation. CVS: Tachycardic S1/S2 +, no murmurs, no gallops, no carotid bruit.  Pulmonary: Effort and breath sounds normal, no stridor, rhonchi, wheezes, rales.  Abdominal: Soft. BS +,  no distension, tenderness, rebound or guarding.  Musculoskeletal: sedated Neuro:sedated     . Skin: Skin is warm and dry. No rash noted.  4+ lower extremity edema Psychiatric: sedated  LABORATORY PANEL:   CBC Recent Labs  Lab 01/20/18 0402  WBC 22.1*  HGB 10.5*  HCT 33.5*  PLT 312   ------------------------------------------------------------------------------------------------------------------  Chemistries  Recent Labs  Lab 01/20/18 0402  NA 138  K 4.5  CL 101  CO2 23  GLUCOSE 152*  BUN 95*  CREATININE 4.54*  CALCIUM 7.3*  MG 2.5*   ------------------------------------------------------------------------------------------------------------------  Cardiac Enzymes No results for input(s): TROPONINI in the last 168 hours. ------------------------------------------------------------------------------------------------------------------  RADIOLOGY:  Ct Head Wo Contrast  Result Date: 01/19/2018 CLINICAL DATA:   52 year old male is poorly responsive despite weaning fentanyl drip. Started hemodialysis. Low-grade fever. Encephalopathy. EXAM: CT HEAD WITHOUT CONTRAST TECHNIQUE: Contiguous axial images were obtained from the base of the skull through the vertex without intravenous contrast. COMPARISON:  Head CTs without contrast 01/14/2018 and earlier. FINDINGS: Brain: No midline shift, ventriculomegaly, mass effect, evidence of mass lesion, intracranial hemorrhage or evidence of cortically based acute infarction. Gray-white matter differentiation remains within normal limits throughout the brain. Vascular: Mild Calcified atherosclerosis at the skull base. No suspicious intracranial vascular hyperdensity. Tortuous basilar artery redemonstrated. Skull: No acute osseous abnormality identified. Sinuses/Orbits: Partial opacification of the right middle ear and mastoids has progressed since 01/14/2018 and is new compared to 01/03/2018. The other visible paranasal sinuses and mastoids are stable and well pneumatized. Other: The patient remains intubated on the scout view. Mildly Disconjugate gaze, otherwise negative orbits soft tissues. Visualized scalp soft tissues are within normal limits. IMPRESSION: 1. Stable and negative noncontrast CT appearance of the brain. 2. Right middle ear and mastoid fluid has progressed since 01/14/2018. This may be related to intubation. Otitis media is felt less likely. Electronically Signed   By: Odessa Fleming M.D.   On: 01/19/2018 14:24     ASSESSMENT AND PLAN:   52 year old male with obesity, diabetes, hypertension came in with tachyarrhythmia and was noted to be in septic shock  * Septic shock with multiorgan failure.- PNA -Continue vent management as per ICU -Sputum culture obtained from tracheal aspirate is positive for gram-positive cocci in clusters and gram-negative coccobacilli 6/22 -IV zyvox, zosyn and eraxis empirically---> changed to IV Diflucan.  -pt is s/o bronchoscopy with  sputum cx no organism -Previous blood cultures obtained on 01/03/2018 are negative.  Urine culture obtained on 01/03/2018 is also negative -MRSA PCR is  negative  * Hypernatremia with hypokalemia --resolved  * Ectopic atrial tachycardia versus atrial flutter-remains tachycardic CT Angio of the chest and pelvis negative for any PE or aortic dissection. Echocardiogram with LV ejection fraction of 45% which is stable from prior. Platelet  count is better and patient is  On IV heparin drip.   -IV amiodarone gtt   per cardiolgoy  * Acute renal failure with metabolic acidosis due to ATN from septic shock and contrast exposure due to CT scans.  Baseline creatinine 1.14 Patient was taking metformin and TCA as an outpatient, had hemodialysis  6/28.   Management per nephrology creatinine upto 3.36--4.05--resumed HD again  *  Thrombocytopenia due to sepsis: Platelet count is increasing--back to normal Hit panel negative Last Platelets at 343,000  *Acute respiratory failure: Due to sepsis and multiorgan failure Remains on ventilator and management per ICU team  * Elevated LFTs due to shock liver with multiorgan failure due to sepsis.  LFTs are improving GI consultation appreciated  *  Nutrition: Continue tube feeds- vital Overall prognosis guarded.  Patient is critically ill.  Poor prognosis with high mortality and morbidity  Palliative care discussed with wife and she is leaning towards comfort care.   CODE STATUS: FULL  TOTAL TIME TAKING CARE OF THIS PATIENT: 25 minutes.     POSSIBLE D/C ??, DEPENDING ON CLINICAL CONDITION.   Enedina FinnerSona Marshawn Normoyle M.D on 01/20/2018 at 1:33 PM  Between 7am to 6pm - Pager - [phone] After 6pm go to [email or web address] - password [email or web address]  [facility] [place] [facility]  Office  [phone]  CC: Primary care physician; Anola Gurneyhauvin, Robert, PA  Note: This dictation was prepared with Dragon dictation along with smaller phrase technology. Any transcriptional errors  that result from this process are unintentional.

## 2018-01-20 NOTE — Progress Notes (Signed)
[facility] Cardiology  SUBJECTIVE: Ryan Webb is a 52 year old male with a history of coronary artery disease, type 2 diabetes, hypertension, and congestive heart failure who was admitted to [facility] on 12/18/2017 for evaluation of abdominal pain and bloating. Patient went into respiratory failure which required intubation. Patient is currently intubated, critically ill, and unable to provide history.    Vitals:   01/20/18 0600 01/20/18 0725 01/20/18 0800 01/20/18 0900  BP:      Pulse: (!) 102     Resp: (!) 26  (!) 27 (!) 22  Temp:      TempSrc:      SpO2: 99% 100%    Weight:      Height:         Intake/Output Summary (Last 24 hours) at 01/20/2018 1100 Last data filed at 01/20/2018 0900 Gross per 24 hour  Intake 1876.78 ml  Output 2077 ml  Net -200.22 ml      PHYSICAL EXAM  General: Critically ill, intubated  HEENT:  Normocephalic and atramatic Neck:  No JVD.  Lungs: Irregular rhythm, irregular rate. No murmurs or gallops. No carotid bruits present.  Heart: HRRR . Normal S1 and S2 without gallops or murmurs.  Abdomen: Bowel sounds are positive, abdomen soft and non-tender  Msk:  Back normal. Normal strength and tone for age. Extremities: No clubbing. 2+ pitting edema bilaterally    Neuro: Sedated Psych:  Sedated   LABS: Basic Metabolic Panel: Recent Labs    01/19/18 0417 01/20/18 0402  NA 139 138  K 4.7 4.5  CL 100 101  CO2 23 23  GLUCOSE 163* 152*  BUN 122* 95*  CREATININE 4.71* 4.54*  CALCIUM 7.4* 7.3*  MG 2.7* 2.5*  PHOS 5.5* 6.4*   Liver Function Tests: Recent Labs    01/19/18 0417 01/20/18 0402  ALBUMIN 2.7* 2.8*   No results for input(s): LIPASE, AMYLASE in the last 72 hours. CBC: Recent Labs    01/19/18 0417 01/20/18 0402  WBC 20.8* 22.1*  HGB 10.0* 10.5*  HCT 31.4* 33.5*  MCV 104.3* 104.9*  PLT 323 312   Cardiac Enzymes: No results for input(s): CKTOTAL, CKMB, CKMBINDEX, TROPONINI in the last 72 hours. BNP: Invalid input(s): POCBNP D-Dimer: No  results for input(s): DDIMER in the last 72 hours. Hemoglobin A1C: No results for input(s): HGBA1C in the last 72 hours. Fasting Lipid Panel: No results for input(s): CHOL, HDL, LDLCALC, TRIG, CHOLHDL, LDLDIRECT in the last 72 hours. Thyroid Function Tests: No results for input(s): TSH, T4TOTAL, T3FREE, THYROIDAB in the last 72 hours.  Invalid input(s): FREET3 Anemia Panel: No results for input(s): VITAMINB12, FOLATE, FERRITIN, TIBC, IRON, RETICCTPCT in the last 72 hours.  Ct Head Wo Contrast  Result Date: 01/19/2018 CLINICAL DATA:  52 year old male is poorly responsive despite weaning fentanyl drip. Started hemodialysis. Low-grade fever. Encephalopathy. EXAM: CT HEAD WITHOUT CONTRAST TECHNIQUE: Contiguous axial images were obtained from the base of the skull through the vertex without intravenous contrast. COMPARISON:  Head CTs without contrast 01/14/2018 and earlier. FINDINGS: Brain: No midline shift, ventriculomegaly, mass effect, evidence of mass lesion, intracranial hemorrhage or evidence of cortically based acute infarction. Gray-white matter differentiation remains within normal limits throughout the brain. Vascular: Mild Calcified atherosclerosis at the skull base. No suspicious intracranial vascular hyperdensity. Tortuous basilar artery redemonstrated. Skull: No acute osseous abnormality identified. Sinuses/Orbits: Partial opacification of the right middle ear and mastoids has progressed since 01/14/2018 and is new compared to 01/03/2018. The other visible paranasal sinuses and mastoids are  stable and well pneumatized. Other: The patient remains intubated on the scout view. Mildly Disconjugate gaze, otherwise negative orbits soft tissues. Visualized scalp soft tissues are within normal limits. IMPRESSION: 1. Stable and negative noncontrast CT appearance of the brain. 2. Right middle ear and mastoid fluid has progressed since 01/14/2018. This may be related to intubation. Otitis media is felt  less likely. Electronically Signed   By: Odessa FlemingH  Hall M.D.   On: 01/19/2018 14:24     Echo. Completed on 01/03/18 showed EF of 45-50%, mild to moderate MR, and moderate to severe TR.   TELEMETRY: Atrial flutter, rate around 90  ASSESSMENT AND PLAN:  Active Problems:   Tachyarrhythmia   Respiratory failure (HCC)   Shock (HCC)   Acute respiratory failure (HCC)   Transaminitis   Pressure injury of skin    1. Atrial Flutter   - Rate better controlled at 90; continue amiodarone drip   - Rate and rhythm refractory to conventional therapy, intolerance of beta-blockers   - Consider transition to Eliquis 5mg  BID prior to discharge  2. Elevated Troponin (.06 on 6/2)   - Borderline elevation likely due to demand ischemia; no ischemic changes noted on ECG  3. Acute Renal Failure (creatinine 4.54 on 7/9)   - Proceed with dialysis today   - Continue with nephrology plan  4. Septic Shock with multi-organ failure   - Continue ventilation management per ICU protocol   - Palliative care meeting with patient's family    The physical exam findings and plan of care were discussed with Dr. Harold HedgeKenneth Arvel Oquinn and all decision making was made in collaboration.   Andi HenceNicole L Stephens, PA-C 01/20/2018 11:00 AM

## 2018-01-20 NOTE — Progress Notes (Signed)
[facility], [place] 01/20/18  Subjective:  Patient remains critically ill at the moment. Still has very little urine output. Tolerated dialysis well yesterday. Tentatively we will plan for dialysis today as well.   Objective:  Vital signs in last 24 hours:  Temp:  [98.3 F (36.8 C)-101.6 F (38.7 C)] 99.7 F (37.6 C) (07/09 0400) Pulse Rate:  [72-110] 102 (07/09 0600) Resp:  [14-27] 22 (07/09 0900) BP: (107-138)/(61-77) 109/64 (07/08 1331) SpO2:  [89 %-100 %] 100 % (07/09 0725) FiO2 (%):  [35 %] 35 % (07/09 0725) Weight:  [148.8 kg (328 lb 0.7 oz)-150.9 kg (332 lb 10.8 oz)] 148.8 kg (328 lb 0.7 oz) (07/09 0405)  Weight change: 13 kg (28 lb 10.6 oz) Filed Weights   01/19/18 0915 01/19/18 1331 01/20/18 0405  Weight: (!) 150.9 kg (332 lb 10.8 oz) (!) 149.1 kg (328 lb 11.3 oz) (!) 148.8 kg (328 lb 0.7 oz)    Intake/Output:    Intake/Output Summary (Last 24 hours) at 01/20/2018 0914 Last data filed at 01/20/2018 0600 Gross per 24 hour  Intake 1841.8 ml  Output 2077 ml  Net -235.2 ml     Physical Exam: General:  Critically ill-appearing, laying in the bed  HEENT  ET tube in place  Pulm/lungs  ventilator assisted,  Scattered rhonchi  CVS/Heart  S1S2 no rubs  Abdomen:   distended, scant BS  Extremities:  3+ generalized pitting edema  Neurologic:  Sedated  Skin:  cyanosis in b/l toes  Access: Rt femoral vascath   Foley catheter in place    Basic Metabolic Panel:  Recent Labs  Lab 01/16/18 0756 01/17/18 0439 01/18/18 0507 01/19/18 0417 01/20/18 0402  NA 146* 141 140 139 138  K 5.0 4.7 5.0 4.7 4.5  CL 103 103 102 100 101  CO2 _0 GLUCOSE 249* 206* 215* 163* 152*  BUN 180* 176* 156* 122* 95*  CREATININE 3.36* 4.05* 4.54* 4.71* 4.54*  CALCIUM 8.0* 7.7* 7.7* 7.4* 7.3*  MG 3.4* 3.1* 2.8* 2.7* 2.5*  PHOS 5.4* 5.2* 6.2* 5.5* 6.4*     CBC: Recent Labs  Lab 01/15/18 0440 01/16/18 0756 01/17/18 0439 01/19/18 0417  01/20/18 0402  WBC 15.3* 20.2* 24.5* 20.8* 22.1*  HGB 10.3* 10.3* 10.1* 10.0* 10.5*  HCT 32.7* 33.9* 31.6* 31.4* 33.5*  MCV 104.6* 106.8* 104.0* 104.3* 104.9*  PLT 300 343 339 323 312      Lab Results  Component Value Date   HEPBSAG Negative 01/09/2018   HEPBSAB Non Reactive 01/09/2018   HEPBIGM Negative 01/03/2018      Microbiology:  Recent Results (from the past 240 hour(s))  CULTURE, BLOOD (ROUTINE X 2) w Reflex to ID Panel     Status: None   Collection Time: 01/10/18  9:35 AM  Result Value Ref Range Status   Specimen Description BLOOD L WRIST  Final   Special Requests   Final    BOTTLES DRAWN AEROBIC AND ANAEROBIC Blood Culture adequate volume   Culture   Final    NO GROWTH 5 DAYS Performed at [facility], [address] [place], [place] [address]    Report Status 01/15/2018 FINAL  Final  CULTURE, BLOOD (ROUTINE X 2) w Reflex to ID Panel     Status: None   Collection Time: 01/10/18  9:47 AM  Result Value Ref Range Status   Specimen Description BLOOD R FOREARM  Final   Special Requests   Final    BOTTLES  DRAWN AEROBIC AND ANAEROBIC Blood Culture adequate volume   Culture   Final    NO GROWTH 5 DAYS Performed at [facility], [address] [place], [place] [address]    Report Status 01/15/2018 FINAL  Final  Culture, respiratory (NON-Expectorated)     Status: None   Collection Time: 01/10/18 12:03 PM  Result Value Ref Range Status   Specimen Description   Final    TRACHEAL ASPIRATE Performed at [facility], [address] [place], [place] [address]    Special Requests   Final    NONE Performed at [facility], [address] [place], [place] [address]    Gram Stain   Final    ABUNDANT WBC PRESENT,BOTH PMN AND MONONUCLEAR RARE SQUAMOUS EPITHELIAL CELLS PRESENT RARE BUDDING YEAST SEEN Performed at [facility], [place] [address], [place], [place] [address]    Culture MODERATE CANDIDA ALBICANS  Final    Report Status 01/13/2018 FINAL  Final  Culture, blood (Routine X 2) w Reflex to ID Panel     Status: None   Collection Time: 01/12/18 10:06 PM  Result Value Ref Range Status   Specimen Description BLOOD RIGHT HAND  Final   Special Requests   Final    BOTTLES DRAWN AEROBIC AND ANAEROBIC Blood Culture adequate volume   Culture   Final    NO GROWTH 5 DAYS Performed at [facility], [address] [place], [place] [address]    Report Status 01/17/2018 FINAL  Final  Culture, blood (Routine X 2) w Reflex to ID Panel     Status: None   Collection Time: 01/12/18 10:07 PM  Result Value Ref Range Status   Specimen Description BLOOD RIGHT ANTECUBITAL  Final   Special Requests   Final    BOTTLES DRAWN AEROBIC AND ANAEROBIC Blood Culture results may not be optimal due to an excessive volume of blood received in culture bottles   Culture   Final    NO GROWTH 5 DAYS Performed at [facility], [address] [place], [place] [address]    Report Status 01/17/2018 FINAL  Final  Culture, respiratory (NON-Expectorated)     Status: None   Collection Time: 01/13/18 12:28 AM  Result Value Ref Range Status   Specimen Description   Final    TRACHEAL ASPIRATE Performed at [facility], [address], [place], [place] [address]    Special Requests   Final    NONE Performed at [facility], [address] [place], [place] [address]    Gram Stain   Final    ABUNDANT WBC PRESENT, PREDOMINANTLY PMN NO ORGANISMS SEEN Performed at [facility], [place] [address] [place], [place] [address]    Culture FEW CANDIDA ALBICANS  Final   Report Status 01/15/2018 FINAL  Final  Urine Culture     Status: None   Collection Time: 01/13/18  3:50 AM  Result Value Ref Range Status   Specimen Description   Final    URINE, RANDOM Performed at [facility], [address], [place], [place] [address]    Special Requests   Final    NONE Performed at [facility], [address] [place], [place] [address]    Culture   Final    NO GROWTH Performed at [facility], [place] [address], [place], [place] [address]    Report Status 01/14/2018 FINAL  Final  Culture, respiratory (NON-Expectorated)  Status: None   Collection Time: 01/14/18 11:07 AM  Result Value Ref Range Status   Specimen Description   Final    TRACHEAL ASPIRATE Performed at [facility], [address], [place], [place] [address]    Special Requests   Final    NONE Performed at [facility], [address] [place], [place] [address]    Gram Stain   Final    RARE WBC PRESENT, PREDOMINANTLY PMN NO ORGANISMS SEEN Performed at [facility], [place] [address] [place], [place] [address]    Culture FEW CANDIDA ALBICANS  Final   Report Status 01/16/2018 FINAL  Final  CULTURE, BLOOD (ROUTINE X 2) w Reflex to ID Panel     Status: None (Preliminary result)   Collection Time: 01/19/18  2:46 PM  Result Value Ref Range Status   Specimen Description BLOOD HAND  Final   Special Requests Blood Culture adequate volume  Final   Culture   Final    NO GROWTH < 24 HOURS Performed at [facility], [address], [place], [place] [address]    Report Status PENDING  Incomplete  CULTURE, BLOOD (ROUTINE X 2) w Reflex to ID Panel     Status: None (Preliminary result)   Collection Time: 01/19/18  4:25 PM  Result Value Ref Range Status   Specimen Description BLOOD LEFT HAND  Final   Special Requests   Final    BOTTLES DRAWN AEROBIC AND ANAEROBIC Blood Culture adequate volume   Culture   Final    NO GROWTH < 24 HOURS Performed at [facility], [address], [place], [place] [address]    Report Status PENDING  Incomplete    Coagulation Studies: No results for input(s): LABPROT, INR in the last 72 hours.  Urinalysis: No results for input(s): COLORURINE, LABSPEC, PHURINE, GLUCOSEU, HGBUR, BILIRUBINUR, KETONESUR, PROTEINUR,  UROBILINOGEN, NITRITE, LEUKOCYTESUR in the last 72 hours.  Invalid input(s): APPERANCEUR    Imaging: Ct Head Wo Contrast  Result Date: 01/19/2018 CLINICAL DATA:  52 year old male is poorly responsive despite weaning fentanyl drip. Started hemodialysis. Low-grade fever. Encephalopathy. EXAM: CT HEAD WITHOUT CONTRAST TECHNIQUE: Contiguous axial images were obtained from the base of the skull through the vertex without intravenous contrast. COMPARISON:  Head CTs without contrast 01/14/2018 and earlier. FINDINGS: Brain: No midline shift, ventriculomegaly, mass effect, evidence of mass lesion, intracranial hemorrhage or evidence of cortically based acute infarction. Gray-white matter differentiation remains within normal limits throughout the brain. Vascular: Mild Calcified atherosclerosis at the skull base. No suspicious intracranial vascular hyperdensity. Tortuous basilar artery redemonstrated. Skull: No acute osseous abnormality identified. Sinuses/Orbits: Partial opacification of the right middle ear and mastoids has progressed since 01/14/2018 and is new compared to 01/03/2018. The other visible paranasal sinuses and mastoids are stable and well pneumatized. Other: The patient remains intubated on the scout view. Mildly Disconjugate gaze, otherwise negative orbits soft tissues. Visualized scalp soft tissues are within normal limits. IMPRESSION: 1. Stable and negative noncontrast CT appearance of the brain. 2. Right middle ear and mastoid fluid has progressed since 01/14/2018. This may be related to intubation. Otitis media is felt less likely. Electronically Signed   By: Genevie Ann M.D.   On: 01/19/2018 14:24     Medications:   . sodium chloride Stopped (01/11/18 1517)  . amiodarone 60 mg/hr (01/20/18 0600)  . anticoagulant sodium citrate    . ceFEPime (MAXIPIME) IV Stopped (01/19/18 1819)  . famotidine (PEPCID) IV Stopped (01/19/18 2130)  .  fentaNYL infusion INTRAVENOUS 100 mcg/hr (01/20/18 0600)  .  fluconazole (DIFLUCAN) IV Stopped (01/19/18 2103)  . heparin 1,500 Units/hr (01/20/18 0600)   . chlorhexidine gluconate (MEDLINE KIT)  15 mL Mouth Rinse BID  . Chlorhexidine Gluconate Cloth  6 each Topical Q0600  . insulin aspart  0-15 Units Subcutaneous Q4H  . ipratropium-albuterol  3 mL Nebulization Q6H  . mouth rinse  15 mL Mouth Rinse 10 times per day  . polyvinyl alcohol  1 drop Both Eyes TID  . sodium chloride flush  10-40 mL Intracatheter Q12H  . sodium chloride flush  3 mL Intravenous Q12H   sodium chloride, acetaminophen **OR** acetaminophen, anticoagulant sodium citrate, bisacodyl, fentaNYL, iopamidol, ipratropium-albuterol, labetalol, midazolam, neomycin-bacitracin-polymyxin, sodium chloride flush, sodium chloride flush, vecuronium  Assessment/ Plan:  52 y.o. male with diabetes mellitus type 2, hypertension, chronic systolic heart failure ejection fraction 45%, who was admitted to [facility] on[facility]21[facility]2019for evaluation of abdominal discomfort and bloating. Patient had decompensation on January 03, 2018 with severe hypotension requiring multiple pressors, acute respiratory failure, shock liver and acute renal failure. Started CRRT from 6/22 to 6/28. Intermittent hemodialysis on 6/28. Trial of furosemide with good results on 6/29. Patient required paralytics during this hospitalization.   1. Acute renal failure secondary to severe hypotension,and also exposed to iv contrast with CTA. Baseline creatinine 1.14, 12/12/16 2. Generalized edema 3. Acute respiratory failure. 4.  Hypotension.      Plan:   Critical illness persists at this time.  We will proceed with dialysis treatment again today with ultrafiltration target of 2.5 kg.  Palliative care to meet with the patient's family.  They may potentially consider withdrawal of care.  With sedation weaning yesterday the patient apparently did track with his eyes.  Therefore neurologic recovery is still possible.  Otherwise we recommend  continued ventilatory support until family makes a decision otherwise.  Continues to have guarded prognosis.     LOS: 17 Raenah Murley 7/9/20199:14 AM  [place], [place]  Note: This note was prepared with Dragon dictation. Any transcription errors are unintentional

## 2018-01-20 NOTE — Progress Notes (Signed)
Palliative care team and I met with Wife There is progressive multiorgan failure with severe resp and renal failure and failure to wean from vent in setting of ileus and mottling  of toes.  AT this time, patient will need trach and PEG tube for survival, however, the wife has stated that patient would NOT want a life with trach at all and would NOT want to be kept alive on machines.  I have advised family to make decision about CODE STATUS as soon as possible and also to make decision of continued medical  Care versus Comfort care measures.  The wife states that she will meet with rest of family and make decision soon.    Family are satisfied with Plan of action and management. All questions answered  Corrin Parker, M.D.  [facility]  Medical Director [place] Director [facility] Cardio-Pulmonary Department

## 2018-01-21 LAB — GLUCOSE, CAPILLARY
GLUCOSE-CAPILLARY: 148 mg/dL — AB (ref 70–99)
Glucose-Capillary: 149 mg/dL — ABNORMAL HIGH (ref 70–99)
Glucose-Capillary: 158 mg/dL — ABNORMAL HIGH (ref 70–99)

## 2018-01-21 LAB — RENAL FUNCTION PANEL
Albumin: 2.8 g/dL — ABNORMAL LOW (ref 3.5–5.0)
Anion gap: 14 (ref 5–15)
BUN: 94 mg/dL — ABNORMAL HIGH (ref 6–20)
CHLORIDE: 101 mmol/L (ref 98–111)
CO2: 25 mmol/L (ref 22–32)
Calcium: 7.5 mg/dL — ABNORMAL LOW (ref 8.9–10.3)
Creatinine, Ser: 4.89 mg/dL — ABNORMAL HIGH (ref 0.61–1.24)
GFR calc non Af Amer: 13 mL/min — ABNORMAL LOW (ref >60)
GFR, EST AFRICAN AMERICAN: 15 mL/min — AB (ref >60)
Glucose, Bld: 159 mg/dL — ABNORMAL HIGH (ref 70–99)
POTASSIUM: 4.3 mmol/L (ref 3.5–5.1)
Phosphorus: 7 mg/dL — ABNORMAL HIGH (ref 2.5–4.6)
Sodium: 140 mmol/L (ref 135–145)

## 2018-01-21 LAB — MAGNESIUM: Magnesium: 2.4 mg/dL (ref 1.7–2.4)

## 2018-01-21 LAB — HEPARIN LEVEL (UNFRACTIONATED): HEPARIN UNFRACTIONATED: 0.32 [IU]/mL (ref 0.30–0.70)

## 2018-01-21 MED ORDER — GLYCOPYRROLATE 0.2 MG/ML IJ SOLN
0.3000 mg | INTRAMUSCULAR | Status: DC | PRN
  Administered 2018-01-21 – 2018-01-22 (×2): 0.3 mg via INTRAVENOUS
  Filled 2018-01-21 (×3): qty 1.5

## 2018-01-21 MED ORDER — LORAZEPAM 2 MG/ML IJ SOLN: 1.0000 mg | INTRAMUSCULAR | Status: DC | PRN

## 2018-01-21 MED ORDER — GLYCOPYRROLATE 0.2 MG/ML IJ SOLN: 0.2000 mg | INTRAMUSCULAR | Status: DC | PRN

## 2018-01-21 MED ORDER — GLYCOPYRROLATE 0.2 MG/ML IJ SOLN
0.3000 mg | INTRAMUSCULAR | Status: DC | PRN
  Filled 2018-01-21: qty 1.5

## 2018-01-21 MED ORDER — HALOPERIDOL LACTATE 5 MG/ML IJ SOLN: 0.5000 mg | INTRAMUSCULAR | Status: DC | PRN

## 2018-01-21 MED ORDER — SODIUM CHLORIDE 0.9% FLUSH: 3.0000 mL | INTRAVENOUS | Status: DC | PRN

## 2018-01-21 MED ORDER — SODIUM CHLORIDE 0.9 % IV SOLN: 250.0000 mL | INTRAVENOUS | Status: DC | PRN

## 2018-01-21 MED ORDER — ONDANSETRON 4 MG PO TBDP
4.0000 mg | ORAL_TABLET | Freq: Four times a day (QID) | ORAL | Status: DC | PRN
  Filled 2018-01-21: qty 1

## 2018-01-21 MED ORDER — MORPHINE 100MG IN NS 100ML (1MG/ML) PREMIX INFUSION
10.0000 mg/h | INTRAVENOUS | Status: DC
  Administered 2018-01-21 – 2018-01-22 (×3): 10 mg/h via INTRAVENOUS
  Filled 2018-01-21 (×3): qty 100

## 2018-01-21 MED ORDER — HALOPERIDOL LACTATE 2 MG/ML PO CONC: 0.5000 mg | ORAL | Status: DC | PRN

## 2018-01-21 MED ORDER — POLYVINYL ALCOHOL 1.4 % OP SOLN
1.0000 [drp] | Freq: Four times a day (QID) | OPHTHALMIC | Status: DC | PRN
  Filled 2018-01-21: qty 15

## 2018-01-21 MED ORDER — SODIUM CHLORIDE 0.9% FLUSH: 3.0000 mL | Freq: Two times a day (BID) | INTRAVENOUS | Status: DC

## 2018-01-21 MED ORDER — ACETAMINOPHEN 650 MG RE SUPP
650.0000 mg | RECTAL | Status: DC | PRN
  Filled 2018-01-21: qty 1

## 2018-01-21 MED ORDER — GLYCOPYRROLATE 1 MG PO TABS: 1.0000 mg | ORAL_TABLET | ORAL | Status: DC | PRN

## 2018-01-21 MED ORDER — HALOPERIDOL 0.5 MG PO TABS: 0.5000 mg | ORAL_TABLET | ORAL | Status: DC | PRN

## 2018-01-21 MED ORDER — ONDANSETRON HCL 4 MG/2ML IJ SOLN: 4.0000 mg | Freq: Four times a day (QID) | INTRAMUSCULAR | Status: DC | PRN

## 2018-01-21 MED ORDER — GLYCOPYRROLATE 0.2 MG/ML IJ SOLN
0.2000 mg | INTRAMUSCULAR | Status: DC | PRN
  Administered 2018-01-21: 0.2 mg via INTRAVENOUS
  Filled 2018-01-21: qty 1

## 2018-01-21 MED ORDER — BIOTENE DRY MOUTH MT LIQD: 15.0000 mL | OROMUCOSAL | Status: DC | PRN

## 2018-01-21 MED ORDER — LORAZEPAM 1 MG PO TABS: 1.0000 mg | ORAL_TABLET | ORAL | Status: DC | PRN

## 2018-01-21 MED ORDER — LORAZEPAM 2 MG/ML PO CONC: 1.0000 mg | ORAL | Status: DC | PRN

## 2018-01-21 MED ORDER — LORAZEPAM 2 MG/ML IJ SOLN
1.0000 mg | INTRAMUSCULAR | Status: DC | PRN
  Administered 2018-01-21: 1 mg via INTRAVENOUS
  Filled 2018-01-21: qty 1

## 2018-01-21 NOTE — Progress Notes (Addendum)
Daily Progress Note   Patient Name: Ryan Webb       Date: 01/21/2018 DOB: Jul 21, 1965  Age: 52 y.o. MRN#: [id number] Attending Physician: Fritzi Mandes, MD Primary Care Physician: Carmon Ginsberg, [place] Admit Date: 12/28/2017  Reason for Consultation/Follow-up: Establishing goals of care  Subjective: Patient is resting in bed on ventilator. His wife and son are at bedside with CCM. Decision made for comfort care this afternoon. Support offered to family.   12:45: Wife states family is ready for comfort care. She would like her husband extubated at this time. Patient is comfortable on current Fentanyl drip. Current Fentanyl drip left in place, and titrated by RN per those orders. Patient extubated.  PRN Ativan added for anxiety. PRN Haldol added for agitation. He appears comfortable. Support offered to family.   4:30: Fentanyl rotated to Morphine by CCM for comfort.  Completed MOST form in chart for DNR, comfort measures, no abx, no IVF, no feeding tube.  Length of Stay: 18  Current Medications: Scheduled Meds:  . chlorhexidine gluconate (MEDLINE KIT)  15 mL Mouth Rinse BID  . ipratropium-albuterol  3 mL Nebulization Q6H  . mouth rinse  15 mL Mouth Rinse 10 times per day  . polyvinyl alcohol  1 drop Both Eyes TID  . sodium chloride flush  3 mL Intravenous Q12H    Continuous Infusions: . sodium chloride    . fentaNYL infusion INTRAVENOUS 375 mcg/hr (01/21/18 1430)  . morphine 10 mg/hr (01/21/18 1602)    PRN Meds: sodium chloride, bisacodyl, fentaNYL, [DISCONTINUED] glycopyrrolate **OR** [DISCONTINUED] glycopyrrolate **OR** glycopyrrolate, [DISCONTINUED] haloperidol **OR** [DISCONTINUED] haloperidol **OR** haloperidol lactate, ipratropium-albuterol, [DISCONTINUED] LORazepam **OR**  [DISCONTINUED] LORazepam **OR** LORazepam, neomycin-bacitracin-polymyxin, ondansetron **OR** ondansetron (ZOFRAN) IV, polyvinyl alcohol, sodium chloride flush  Physical Exam  Constitutional: No distress.  Pulmonary/Chest:  intubated            Vital Signs: BP 137/84 (BP Location: Other (Comment)) Comment (BP Location): aline  Pulse 89   Temp 99.6 F (37.6 C) (Oral)   Resp (!) 25   Ht 5' 9"  (1.753 m)   Wt (!) 142.8 kg (314 lb 13.1 oz)   SpO2 100%   BMI 46.49 kg/m  SpO2: SpO2: 100 % O2 Device: O2 Device: Ventilator O2 Flow Rate:    Intake/output summary:   Intake/Output Summary (Last 24 hours)  at 01/21/2018 1629 Last data filed at 01/21/2018 0700 Gross per 24 hour  Intake 1733.59 ml  Output 2566 ml  Net -832.41 ml   LBM: Last BM Date: 01/20/18 Baseline Weight: Weight: 122.5 kg (270 lb) Most recent weight: Weight: (!) 142.8 kg (314 lb 13.1 oz)       Palliative Assessment/Data: 10%      Patient Active Problem List   Diagnosis Date Noted  . Pressure injury of skin 01/10/2018  . Transaminitis   . Acute respiratory failure ([place]) 01/03/2018  . Respiratory failure ([place])   . Shock ([place])   . Tachyarrhythmia 12/21/2017  . Diabetes mellitus without complication ([place]) 17/61/6073  . Cardiomyopathy ([place]) 01/17/2016  . Coronary artery disease 01/17/2016  . Breathlessness on exertion 01/17/2016  . Acid reflux 01/17/2016  . H/O: HTN (hypertension) 01/17/2016  . 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 Prostatitis 01/17/2016    Palliative Care Assessment & Plan   Patient Profile: Chi Garlow Woodsis an 52 y.o.malewithpast medical history of hypertension, diabetes, tachycardia,admitted initiallyon 6/21for abdominal pain, noted to have severe acidosis, possibly related to metformin use,respiratory failure, renal failure, now with prolonged mechanical ventilation.   Assessment/ Recommendations/Plan:  Extubated. Comfort care.  Code Status:    Code  Status Orders  (From admission, onward)        Start     Ordered   01/21/18 1344  Do not attempt resuscitation (DNR)  Continuous    Question Answer Comment  In the event of cardiac or respiratory ARREST Do not call a "code blue"   In the event of cardiac or respiratory ARREST Do not perform Intubation, CPR, defibrillation or ACLS   In the event of cardiac or respiratory ARREST Use medication by any route, position, wound care, and other measures to relive pain and suffering. May use oxygen, suction and manual treatment of airway obstruction as needed for comfort.      01/21/18 1344    Code Status History    Date Active Date Inactive Code Status Order ID Comments User Context   01/20/2018 2208 01/21/2018 1344 DNR [id number]  Awilda Bill, NP Inpatient   01/05/2018 1742 01/20/2018 2208 Full Code [id number]  Saundra Shelling, MD Inpatient       Prognosis:   Hours - Days  Discharge Planning:  Anticipated Hospital Death  Care plan was discussed with CCM and RN  Thank you for allowing the Palliative Medicine Team to assist in the care of this patient.   Time In: 11:20 12:45 Time Out: 11:30 2:20 Total Time 100 min Prolonged Time Billed  no      Greater than 50%  of this time was spent counseling and coordinating care related to the above assessment and plan.  Asencion Gowda, NP  Please contact Palliative Medicine Team phone at [phone] for questions and concerns.

## 2018-01-21 NOTE — Progress Notes (Signed)
Patient is palced on high fowlers position, cuff deflated, suctioned orally and endotarcheally and then extubated to 4 lpm O2 [place]

## 2018-01-21 NOTE — Progress Notes (Signed)
   01/21/18 1100  Clinical Encounter Type  Visited With Family  Visit Type Follow-up  Spiritual Encounters  Spiritual Needs Emotional   Chaplain checked in with family in waiting room.  Patient spouse reported having made a decision and needing to speak with physician.  Spouse appeared tearful and didn't want to talk with chaplain at present but said that she needed to let the doctor know what they had decided.  Patient spouse and son went onto unit.  Chaplain stayed with family friend, son's girlfriend and patient's younger daughter, offering emotional support as they processed family's decision and that they weren't telling everyone what had been decided until after they spoke with the physician.  Chaplain will continue to follow.

## 2018-01-21 NOTE — Progress Notes (Signed)
ANTICOAGULATION CONSULT NOTE - Follow Up Consult  Pharmacy Consult for Heparin Drip Indication: atrial fibrillation  Allergies  Allergen Reactions  . No Known Allergies     Patient Measurements: Height: 5\' 9"  (175.3 cm) Weight: (!) 314 lb 13.1 oz (142.8 kg) IBW/kg (Calculated) : 70.7 Heparin Dosing Weight: 98.9 kg  Vital Signs: Temp: 99.4 F (37.4 C) (07/10 0355) Temp Source: Oral (07/10 0355) Pulse Rate: 88 (07/10 0400)  Labs: Recent Labs    01/19/18 0158 01/19/18 0417 01/20/18 0402 01/21/18 0444  HGB  --  10.0* 10.5*  --   HCT  --  31.4* 33.5*  --   PLT  --  323 312  --   HEPARINUNFRC 0.50  --  0.47 0.32  CREATININE  --  4.71* 4.54*  --     Estimated Creatinine Clearance: 27.1 mL/min (A) (by C-G formula based on SCr of 4.54 mg/dL (H)).  Pharmacy consulted for heparin drip management for 52 yo male admitted with tachyarrhythmias.   Heparin currently running at 1500 units/hr 7/7 20:00 Heparin level resulted at 0.60  Goal of Therapy:  Heparin level 0.3-0.7 units/ml Monitor platelets by anticoagulation protocol: Yes   Assessment/Plan:  07/08 @ 0200 HL 0.50 therapeutic. Will continue at 1500 units/hr and will recheck next anti-Xa w/ am labs. Will f/u on next CBC.  07/09 @ 0400 HL = 0.47   Will continue this pt on current rate and recheck HL on 7/10 with AM labs.   07/10 @ 0440 HL = 0.32 Will continue pt on current rate and recheck HL on 7/11 with AM labs.   Andreus Cure D Clinical Pharmacist 01/21/2018

## 2018-01-21 NOTE — Progress Notes (Signed)
   01/21/18 1520  Clinical Encounter Type  Visited With Family  Visit Type Follow-up  Spiritual Encounters  Spiritual Needs Emotional   Chaplain encountered family friend in the hallway; conversation regarding prayer and patient's life ensued.  Another family member came by and friend accompanied her to her car.

## 2018-01-21 NOTE — Care Management (Signed)
Late note entry from 01/20/18-LT[facility] screen requested from both [place] [facility] and Irving Burtonmily with [facility]. [facility] on daily dialysis however [facility] would need a schedule.  Patient is now DNR with family deciding on trach/PEG/comfort care. RNCM will follow. Family has already been talked to by CSW regarding LTAC.

## 2018-01-21 NOTE — Progress Notes (Signed)
[facility]  SUBJECTIVE: [name] is a 52 year old male with a history of coronary artery disease, type 2 diabetes, hypertension, and congestive heart failure who was admitted to [facility] on 08-25-2017 for evaluation of abdominal pain and bloating. Patient went into respiratory failure which required intubation. Patient is currently intubated, critically ill, and unable to provide history   Vitals:   01/21/18 0255 01/21/18 0324 01/21/18 0355 01/21/18 0400  BP:      Pulse:    88  Resp:    15  Temp:   99.4 F (37.4 C)   TempSrc:   Oral   SpO2:  98% 100% 99%  Weight: (!) 142.8 kg (314 lb 13.1 oz)     Height:         Intake/Output Summary (Last 24 hours) at 01/21/2018 0815 Last data filed at 01/21/2018 0700 Gross per 24 hour  Intake 1908.49 ml  Output 2566 ml  Net -657.51 ml      PHYSICAL EXAM  General: Critically ill, intubated  HEENT:  Normocephalic and atramatic Neck:  No JVD.  Lungs: Clear bilaterally to auscultation and percussion. No wheezes or crackles.  Heart: Irregular rhythm. No murmurs or gallops. No carotid bruits present.  Abdomen: Bowel sounds are hypoactive, abdomen soft and non-tender  Msk:  Back normal. Normal strength and tone for age. Extremities: No clubbing. 2+ pitting edema bilaterally.    Neuro: Sedated Psych:  Sedated    LABS: Basic Metabolic Panel: Recent Labs    01/20/18 0402 01/21/18 0444  NA 138 140  K 4.5 4.3  CL 101 101  CO2 23 25  GLUCOSE 152* 159*  BUN 95* 94*  CREATININE 4.54* 4.89*  CALCIUM 7.3* 7.5*  MG 2.5* 2.4  PHOS 6.4* 7.0*   Liver Function Tests: Recent Labs    01/20/18 0402 01/21/18 0444  ALBUMIN 2.8* 2.8*   No results for input(s): LIPASE, AMYLASE in the last 72 hours. CBC: Recent Labs    01/19/18 0417 01/20/18 0402  WBC 20.8* 22.1*  HGB 10.0* 10.5*  HCT 31.4* 33.5*  MCV 104.3* 104.9*  PLT 323 312   Cardiac Enzymes: No results for input(s): CKTOTAL, CKMB, CKMBINDEX, TROPONINI in the last 72  hours. BNP: Invalid input(s): POCBNP D-Dimer: No results for input(s): DDIMER in the last 72 hours. Hemoglobin A1C: No results for input(s): HGBA1C in the last 72 hours. Fasting Lipid Panel: No results for input(s): CHOL, HDL, LDLCALC, TRIG, CHOLHDL, LDLDIRECT in the last 72 hours. Thyroid Function Tests: No results for input(s): TSH, T4TOTAL, T3FREE, THYROIDAB in the last 72 hours.  Invalid input(s): FREET3 Anemia Panel: No results for input(s): VITAMINB12, FOLATE, FERRITIN, TIBC, IRON, RETICCTPCT in the last 72 hours.  Ct Head Wo Contrast  Result Date: 01/19/2018 CLINICAL DATA:  52 year old male is poorly responsive despite weaning fentanyl drip. Started hemodialysis. Low-grade fever. Encephalopathy. EXAM: CT HEAD WITHOUT CONTRAST TECHNIQUE: Contiguous axial images were obtained from the base of the skull through the vertex without intravenous contrast. COMPARISON:  Head CTs without contrast 01/14/2018 and earlier. FINDINGS: Brain: No midline shift, ventriculomegaly, mass effect, evidence of mass lesion, intracranial hemorrhage or evidence of cortically based acute infarction. Gray-white matter differentiation remains within normal limits throughout the brain. Vascular: Mild Calcified atherosclerosis at the skull base. No suspicious intracranial vascular hyperdensity. Tortuous basilar artery redemonstrated. Skull: No acute osseous abnormality identified. Sinuses/Orbits: Partial opacification of the right middle ear and mastoids has progressed since 01/14/2018 and is new compared to 01/03/2018. The other visible paranasal sinuses  and mastoids are stable and well pneumatized. Other: The patient remains intubated on the scout view. Mildly Disconjugate gaze, otherwise negative orbits soft tissues. Visualized scalp soft tissues are within normal limits. IMPRESSION: 1. Stable and negative noncontrast CT appearance of the brain. 2. Right middle ear and mastoid fluid has progressed since 01/14/2018. This  may be related to intubation. Otitis media is felt less likely. Electronically Signed   By: Odessa Fleming M.D.   On: 01/19/2018 14:24     Echo: Completed on 01/03/18 showed EF of 45-50%, mild to moderate MR, and moderate to severe TR   TELEMETRY: Atrial Flutter, rate 92  ASSESSMENT AND PLAN:  Active Problems:   Tachyarrhythmia   Respiratory failure (HCC)   Shock (HCC)   Acute respiratory failure (HCC)   Transaminitis   Pressure injury of skin    1. Atrial Flutter   - Rate well controlled at 92; continue amiodarone drip   - Intolerance of beta-blockers  2. Elevated troponin (.06 on 6/2)  - Borderline elevation likely due to demand ischemia; no ischemic changes noted on ECG  3. Acute Renal Failure (creatinine 4.89 on 7/9)  - Proceed with daily hemodialysis   - Continue with nephrology plan  4. Septic shock with multi-organ failure   - Continue ventilation management per ICU protocol  - Palliative care discussed with patient's wife; leaning toward comfort care    The physical exam findings and plan of care were discussed with Dr. Harold Hedge and all decision making was made in collaboration.     Andi Hence  PA-C 01/21/2018 8:15 AM

## 2018-01-21 NOTE — Progress Notes (Signed)
[facility], [place] 01/21/18  Subjective:  Patient remains critically ill at the moment. Still on the ventilator. He is undergone daily dialysis this past week. Patient was net negative yesterday. Family still considering comfort care but has not yet reached a final decision. Therefore we are planning for dialysis today.   Objective:  Vital signs in last 24 hours:  Temp:  [98.3 F (36.8 C)-99.4 F (37.4 C)] 98.9 F (37.2 C) (07/10 0800) Pulse Rate:  [74-102] 89 (07/10 0800) Resp:  [7-26] 25 (07/10 0800) BP: (120-137)/(55-84) 137/84 (07/10 0800) SpO2:  [98 %-100 %] 100 % (07/10 0800) FiO2 (%):  [35 %] 35 % (07/10 0730) Weight:  [142.8 kg (314 lb 13.1 oz)-148.8 kg (328 lb 0.7 oz)] 142.8 kg (314 lb 13.1 oz) (07/10 0255)  Weight change: -2.1 kg (-4 lb 10.1 oz) Filed Weights   01/20/18 0405 01/20/18 1335 01/21/18 0255  Weight: (!) 148.8 kg (328 lb 0.7 oz) (!) 148.8 kg (328 lb 0.7 oz) (!) 142.8 kg (314 lb 13.1 oz)    Intake/Output:    Intake/Output Summary (Last 24 hours) at 01/21/2018 4037 Last data filed at 01/21/2018 0700 Gross per 24 hour  Intake 1733.59 ml  Output 2566 ml  Net -832.41 ml     Physical Exam: General:  Critically ill-appearing, laying in the bed  HEENT  ET tube in place  Pulm/lungs  ventilator assisted,  Scattered rhonchi  CVS/Heart  S1S2 no rubs  Abdomen:   distended, scant BS  Extremities:  3+ generalized pitting edema  Neurologic:  Sedated  Skin:  cyanosis in b/l toes  Access:  Rt femoral vascath    Foley catheter in place    Basic Metabolic Panel:  Recent Labs  Lab 01/17/18 0439 01/18/18 0507 01/19/18 0417 01/20/18 0402 01/21/18 0444  NA 141 140 139 138 140  K 4.7 5.0 4.7 4.5 4.3  CL 103 102 100 101 101  CO2 24 24 23 23 25   GLUCOSE 206* 215* 163* 152* 159*  BUN 176* 156* 122* 95* 94*  CREATININE 4.05* 4.54* 4.71* 4.54* 4.89*  CALCIUM 7.7* 7.7* 7.4* 7.3* 7.5*  MG 3.1* 2.8* 2.7* 2.5* 2.4  PHOS 5.2*  6.2* 5.5* 6.4* 7.0*     CBC: Recent Labs  Lab 01/15/18 0440 01/16/18 0756 01/17/18 0439 01/19/18 0417 01/20/18 0402  WBC 15.3* 20.2* 24.5* 20.8* 22.1*  HGB 10.3* 10.3* 10.1* 10.0* 10.5*  HCT 32.7* 33.9* 31.6* 31.4* 33.5*  MCV 104.6* 106.8* 104.0* 104.3* 104.9*  PLT 300 343 339 323 312      Lab Results  Component Value Date   HEPBSAG Negative 01/09/2018   HEPBSAB Non Reactive 01/09/2018   HEPBIGM Negative 01/03/2018      Microbiology:  Recent Results (from the past 240 hour(s))  Culture, blood (Routine X 2) w Reflex to ID Panel     Status: None   Collection Time: 01/12/18 10:06 PM  Result Value Ref Range Status   Specimen Description BLOOD RIGHT HAND  Final   Special Requests   Final    BOTTLES DRAWN AEROBIC AND ANAEROBIC Blood Culture adequate volume   Culture   Final    NO GROWTH 5 DAYS Performed at [facility], [address] [place], [place] [address]    Report Status 01/17/2018 FINAL  Final  Culture, blood (Routine X 2) w Reflex to ID Panel     Status: None   Collection Time: 01/12/18 10:07 PM  Result Value Ref Range Status  Specimen Description BLOOD RIGHT ANTECUBITAL  Final   Special Requests   Final    BOTTLES DRAWN AEROBIC AND ANAEROBIC Blood Culture results may not be optimal due to an excessive volume of blood received in culture bottles   Culture   Final    NO GROWTH 5 DAYS Performed at [facility], [address], [place], [place] [address]    Report Status 01/17/2018 FINAL  Final  Culture, respiratory (NON-Expectorated)     Status: None   Collection Time: 01/13/18 12:28 AM  Result Value Ref Range Status   Specimen Description   Final    TRACHEAL ASPIRATE Performed at [facility], [address], [place], [place] [address]    Special Requests   Final    NONE Performed at [facility], [address] [place], [place] [address]    Gram Stain   Final    ABUNDANT WBC PRESENT, PREDOMINANTLY PMN NO  ORGANISMS SEEN Performed at [facility], [place] [address], [place], [place] [address]    Culture FEW CANDIDA ALBICANS  Final   Report Status 01/15/2018 FINAL  Final  Urine Culture     Status: None   Collection Time: 01/13/18  3:50 AM  Result Value Ref Range Status   Specimen Description   Final    URINE, RANDOM Performed at [facility], [address] [place], [place] [address]    Special Requests   Final    NONE Performed at [facility], [address], [place], [place] [address]    Culture   Final    NO GROWTH Performed at [facility], [place] [address], [place], [place] [address]    Report Status 01/14/2018 FINAL  Final  Culture, respiratory (NON-Expectorated)     Status: None   Collection Time: 01/14/18 11:07 AM  Result Value Ref Range Status   Specimen Description   Final    TRACHEAL ASPIRATE Performed at [facility], [address], [place], [place] [address]    Special Requests   Final    NONE Performed at [facility], [address] [place], [place] [address]    Gram Stain   Final    RARE WBC PRESENT, PREDOMINANTLY PMN NO ORGANISMS SEEN Performed at [facility], [place] [address], [place], [place] [address]    Culture FEW CANDIDA ALBICANS  Final   Report Status 01/16/2018 FINAL  Final  CULTURE, BLOOD (ROUTINE X 2) w Reflex to ID Panel     Status: None (Preliminary result)   Collection Time: 01/19/18  2:46 PM  Result Value Ref Range Status   Specimen Description BLOOD HAND  Final   Special Requests Blood Culture adequate volume  Final   Culture   Final    NO GROWTH < 24 HOURS Performed at [facility], [address], [place], [place] [address]    Report Status PENDING  Incomplete  CULTURE, BLOOD (ROUTINE X 2) w Reflex to ID Panel     Status: None (Preliminary result)   Collection Time: 01/19/18  4:25 PM  Result Value Ref Range Status   Specimen Description BLOOD LEFT HAND  Final   Special  Requests   Final    BOTTLES DRAWN AEROBIC AND ANAEROBIC Blood Culture adequate volume   Culture   Final    NO GROWTH < 24 HOURS Performed at [facility], [address], [place],  [address]    Report Status PENDING  Incomplete    Coagulation Studies: No results for input(s): LABPROT, INR in the last 72 hours.  Urinalysis: No results for input(s): COLORURINE, LABSPEC, PHURINE, GLUCOSEU, HGBUR, BILIRUBINUR, KETONESUR, PROTEINUR, UROBILINOGEN, NITRITE, LEUKOCYTESUR in the last 72 hours.  Invalid input(s): APPERANCEUR    Imaging: Ct Head Wo Contrast  Result Date: 01/19/2018 CLINICAL DATA:  52 year old male is poorly responsive despite weaning fentanyl drip. Started hemodialysis. Low-grade fever. Encephalopathy. EXAM: CT HEAD WITHOUT CONTRAST TECHNIQUE: Contiguous axial images were obtained from the base of the skull through the vertex without intravenous contrast. COMPARISON:  Head CTs without contrast 01/14/2018 and earlier. FINDINGS: Brain: No midline shift, ventriculomegaly, mass effect, evidence of mass lesion, intracranial hemorrhage or evidence of cortically based acute infarction. Gray-white matter differentiation remains within normal limits throughout the brain. Vascular: Mild Calcified atherosclerosis at the skull base. No suspicious intracranial vascular hyperdensity. Tortuous basilar artery redemonstrated. Skull: No acute osseous abnormality identified. Sinuses/Orbits: Partial opacification of the right middle ear and mastoids has progressed since 01/14/2018 and is new compared to 01/03/2018. The other visible paranasal sinuses and mastoids are stable and well pneumatized. Other: The patient remains intubated on the scout view. Mildly Disconjugate gaze, otherwise negative orbits soft tissues. Visualized scalp soft tissues are within normal limits. IMPRESSION: 1. Stable and negative noncontrast CT appearance of the brain. 2. Right middle ear and mastoid fluid has  progressed since 01/14/2018. This may be related to intubation. Otitis media is felt less likely. Electronically Signed   By: Genevie Ann M.D.   On: 01/19/2018 14:24     Medications:   . sodium chloride Stopped (01/11/18 1517)  . amiodarone 60 mg/hr (01/21/18 0821)  . ceFEPime (MAXIPIME) IV Stopped (01/20/18 1819)  . famotidine (PEPCID) IV Stopped (01/21/18 0910)  . fentaNYL infusion INTRAVENOUS 200 mcg/hr (01/21/18 0910)  . fluconazole (DIFLUCAN) IV Stopped (01/20/18 1844)  . heparin 1,500 Units/hr (01/20/18 2111)   . chlorhexidine gluconate (MEDLINE KIT)  15 mL Mouth Rinse BID  . Chlorhexidine Gluconate Cloth  6 each Topical Q0600  . insulin aspart  0-15 Units Subcutaneous Q4H  . ipratropium-albuterol  3 mL Nebulization Q6H  . mouth rinse  15 mL Mouth Rinse 10 times per day  . polyvinyl alcohol  1 drop Both Eyes TID  . sodium chloride flush  10-40 mL Intracatheter Q12H  . sodium chloride flush  3 mL Intravenous Q12H   sodium chloride, acetaminophen **OR** acetaminophen, bisacodyl, fentaNYL, iopamidol, ipratropium-albuterol, labetalol, midazolam, neomycin-bacitracin-polymyxin, sodium chloride flush, sodium chloride flush, vecuronium  Assessment/ Plan:  52 y.o. male with diabetes mellitus type 2, hypertension, chronic systolic heart failure ejection fraction 45%, who was admitted to [facility] on[facility]21[facility]2019for evaluation of abdominal discomfort and bloating. Patient had decompensation on January 03, 2018 with severe hypotension requiring multiple pressors, acute respiratory failure, shock liver and acute renal failure. Started CRRT from 6/22 to 6/28. Intermittent hemodialysis on 6/28. Trial of furosemide with good results on 6/29. Patient required paralytics during this hospitalization.   1. Acute renal failure secondary to severe hypotension,and also exposed to iv contrast with CTA. Baseline creatinine 1.14, 12/12/16 2. Generalized edema 3. Acute respiratory failure. 4.  Hypotension.       Plan:   The family has not yet made a final decision regarding disposition however patient has been made DNR.  Since a final decision has not been made we will continue with aggressive medical care and we will plan for additional dialysis today with ultrafiltration target of 2.5 kg.  His respiratory failure persist and he  is not wean able from the ventilator at this time.  If aggressive care is decided upon he will likely need tracheostomy in the relative near future as well.  As before prognosis remains guarded.     LOS: 18 Sebrina Kessner 7/10/20199:22 [place], [place]  Note: This note was prepared with Dragon dictation. Any transcription errors are unintentional

## 2018-01-21 NOTE — Progress Notes (Signed)
Chaplain observed a large gathering outside of the CCU and learned that the patient's status had changed to comfort care. Chaplain engaged the family to let them know that pastoral care services are available. Chaplain alerted [facility] of the situation. Chaplain also met with the family's pastor and offered to assist as needed.

## 2018-01-21 NOTE — Progress Notes (Signed)
After further assessment and evaluation, patient with progressive Multiorgan failure  Wife and Son at bedside and they have stated that patient would NOT want to live on machines and would NOT want to live like this. They have consented to stop Hemodialysis at this time.   They also have consented and agreed to Comfort care measures and will proceed later this after noon.  Palliative care team also in agreement with plan of comfort care later today.    Family are satisfied with Plan of action and management. All questions answered  Lucie LeatherKurian David Coree Riester, M.D.  [facility]  Medical Director [facility] Medical Director [facility] Cardio-Pulmonary Department

## 2018-01-21 NOTE — Progress Notes (Signed)
[employer] [facility] [place] [facility]   PATIENT NAME: Ryan Webb    MR#:  [id number]  DATE OF BIRTH:  17-Sep-1965  SUBJECTIVE:   Patient remains critically ill. Per RN pt is going to be terminally extubated  REVIEW OF SYSTEMS:    unAble to obtain  tolerating Diet: Tube feeds DRUG ALLERGIES:   Allergies  Allergen Reactions  . No Known Allergies     VITALS:  Blood pressure 137/84, pulse 89, temperature 99.6 F (37.6 C), temperature source Oral, resp. rate (!) 25, height 5\' 9"  (1.753 m), weight (!) 142.8 kg (314 lb 13.1 oz), SpO2 100 %.  PHYSICAL EXAMINATION:  Constitutional: Appears obese sedated on vent, critically ill, anasarca HENT: Normocephalic.  Intubated Eyes: , no scleral icterus.  Neck: Normal ROM. Neck supple. No JVD. No tracheal deviation. CVS: Tachycardic S1/S2 +, no murmurs, no gallops, no carotid bruit.  Pulmonary: Effort and breath sounds normal, no stridor, rhonchi, wheezes, rales.  Abdominal: Soft. BS +,  no distension, tenderness, rebound or guarding.  Musculoskeletal: sedated Neuro:sedated     . Skin: Skin is warm and dry. No rash noted.  4+ lower extremity edema Psychiatric: sedated  LABORATORY PANEL:   CBC Recent Labs  Lab 01/20/18 0402  WBC 22.1*  HGB 10.5*  HCT 33.5*  PLT 312   ------------------------------------------------------------------------------------------------------------------  Chemistries  Recent Labs  Lab 01/21/18 0444  NA 140  K 4.3  CL 101  CO2 25  GLUCOSE 159*  BUN 94*  CREATININE 4.89*  CALCIUM 7.5*  MG 2.4   ------------------------------------------------------------------------------------------------------------------  Cardiac Enzymes No results for input(s): TROPONINI in the last 168 hours. ------------------------------------------------------------------------------------------------------------------  RADIOLOGY:  No results found.   ASSESSMENT AND PLAN:   52 year old male with  obesity, diabetes, hypertension came in with tachyarrhythmia and was noted to be in septic shock  * Septic shock with multiorgan failure.- PNA -Continue vent management as per ICU -Sputum culture obtained from tracheal aspirate is positive for gram-positive cocci in clusters and gram-negative coccobacilli 6/22 -IV zyvox, zosyn and eraxis empirically---> changed to IV Diflucan.  -pt is s/o bronchoscopy with sputum cx no organism -Previous blood cultures obtained on 01/03/2018 are negative.  Urine culture obtained on 01/03/2018 is also negative -MRSA PCR is negative  * Hypernatremia with hypokalemia --resolved  * Ectopic atrial tachycardia versus atrial flutter-remains tachycardic CT Angio of the chest and pelvis negative for any PE or aortic dissection. Echocardiogram with LV ejection fraction of 45% which is stable from prior. Platelet  count is better and patient is  On IV heparin drip.   -IV amiodarone gtt   per cardiolgoy  * Acute renal failure with metabolic acidosis due to ATN from septic shock and contrast exposure due to CT scans.  Baseline creatinine 1.14 Patient was taking metformin and TCA as an outpatient, had hemodialysis  6/28.   Management per nephrology creatinine upto 3.36--4.05--resumed HD again  *  Thrombocytopenia due to sepsis: Platelet count is increasing--back to normal Hit panel negative Last Platelets at 343,000  *Acute respiratory failure: Due to sepsis and multiorgan failure Remains on ventilator and management per ICU team  * Elevated LFTs due to shock liver with multiorgan failure due to sepsis.  LFTs are improving GI consultation appreciated  *  Nutrition: Continue tube feeds- vital Overall prognosis guarded.  Patient is critically ill.  Poor prognosis with high mortality and morbidity  Family is aimed towards terminal extubation and comfort care only.  Palliative care discussed with wife and she  is leaning towards comfort care.   CODE STATUS:  FULL  TOTAL TIME TAKING CARE OF THIS PATIENT: 25 minutes.     POSSIBLE D/C ??, DEPENDING ON CLINICAL CONDITION.   Enedina FinnerSona Michalina Calbert M.D on 01/21/2018 at 2:41 PM  Between 7am to 6pm - Pager - [phone] After 6pm go to [email or web address] - password [email or web address]  [facility]  Office  [phone]  CC: Primary care physician; Anola Gurneyhauvin, Robert, PA  Note: This dictation was prepared with Dragon dictation along with smaller phrase technology. Any transcriptional errors that result from this process are unintentional.

## 2018-01-21 NOTE — Progress Notes (Signed)
Report given to Alvan DameAshley R. RN, for patient to be transferred to room 122 with family present during transfer and took all belongings with wife.

## 2018-01-21 NOTE — Progress Notes (Signed)
CRITICAL CARE NOTE  CC  Severe respiratory failure  SUBJECTIVE Patient remains critically ill Prognosis is guarded On vent, sedated IHD needed again today    SIGNIFICANT EVENTS Failure to wean from Vent last 5 days TF's on hold   BP 137/84 (BP Location: Other (Comment)) Comment (BP Location): aline  Pulse 89   Temp 98.9 F (37.2 C) (Oral)   Resp (!) 25   Ht 5\' 9"  (1.753 m)   Wt (!) 314 lb 13.1 oz (142.8 kg)   SpO2 100%   BMI 46.49 kg/m    REVIEW OF SYSTEMS  PATIENT IS UNABLE TO PROVIDE COMPLETE REVIEW OF SYSTEM S DUE TO SEVERE CRITICAL ILLNESS AND ENCEPHALOPATHY   PHYSICAL EXAMINATION:  GENERAL:critically ill appearing, +resp distress HEAD: Normocephalic, atraumatic.  EYES: Pupils equal, round, reactive to light.  No scleral icterus.  MOUTH: Moist mucosal membrane. NECK: Supple. No thyromegaly. No nodules. No JVD.  PULMONARY: +rhonchi, +wheezing CARDIOVASCULAR: S1 and S2. Regular rate and rhythm. No murmurs, rubs, or gallops.  GASTROINTESTINAL: Soft, nontender, -distended. No masses. Positive bowel sounds. No hepatosplenomegaly.  MUSCULOSKELETAL: No swelling, clubbing, or edema.  NEUROLOGIC: obtunded, GCS<8 SKIN:intact,warm,dry   ASSESSMENT AND PLAN 52 yo morbidly obese white male with progressive resp failure with severe septic shock from probable acute viral syndrome in setting of metformin toxicity and sever acidosis, complicated by acute renal failure with difficulty weaning from ventcomplicated by ileus   Severe Hypoxic and Hypercapnic Respiratory Failure Unable to wean from vent  NEEDS TRACH TO SURVIVE  PATIENT IS NOW DNR/DNI   Renal Failure-most likely due to ATN -follow chem 7 -follow UO -continue Foley Catheter-assess need Follow up nephrology consultation IHD as needed  NEUROLOGY - intubated and sedated - minimal sedation to achieve a RASS goal: -1    CARDIAC ICU monitoring  ID -continue IV abx as prescibed -follow up  cultures Repeat cultures pending   TF on hold-ileus NG to suction  DVT/GI PRX ordered TRANSFUSIONS AS NEEDED MONITOR FSBS ASSESS the need for LABS as needed   Critical Care Time devoted to patient care services described in this note is 31 minutes.   Overall, patient is critically ill, prognosis is guarded.  Patient with Multiorgan failure and at high risk for cardiac arrest and death.   Recommend Trach/PEG versus COMFORT CARE  Lucie LeatherKurian David Starling Christofferson, M.D.  [facility]  Medical Director [facility] Medical Director [facility] Cardio[facility] Department

## 2018-01-21 NOTE — Progress Notes (Signed)
Family decided to cancel HD tx and put pt on comfort care.

## 2018-01-24 LAB — CULTURE, BLOOD (ROUTINE X 2)
CULTURE: NO GROWTH
Culture: NO GROWTH
SPECIAL REQUESTS: ADEQUATE
SPECIAL REQUESTS: ADEQUATE

## 2018-01-27 ENCOUNTER — Telehealth: Payer: Self-pay | Admitting: *Deleted

## 2018-01-27 ENCOUNTER — Telehealth: Payer: Self-pay

## 2018-01-27 NOTE — Telephone Encounter (Signed)
Death certificate pending death summary completion.

## 2018-01-27 NOTE — Telephone Encounter (Signed)
Recieved Death Certificate from ____[employer] ______ Delivered/Placed _in nurse box___________

## 2018-01-28 NOTE — Telephone Encounter (Signed)
Death certificate taken to office of hospitalist and [facility] to [facility] (reception[facility] Per Dr. Belia HemanKasa patient did not pass in ICU. Nothing further needed.

## 2018-01-29 NOTE — Telephone Encounter (Signed)
Documented on previous encounter.

## 2018-02-12 NOTE — Plan of Care (Signed)

## 2018-02-12 NOTE — Death Summary Note (Signed)
DEATH SUMMARY   Patient Details  Name: Ryan Webb MRN: [id number] DOB: 1965-11-22  Admission/Discharge Information   Admit Date:  01/19/2018  Date of Death: Date of Death: 08-Feb-2018  Time of Death: Time of Death: 0306  Length of Stay: 2022/12/18  Referring Physician: Anola Gurney, PA   Reason(s) for Hospitalization   Chest pain and irregular heartbeat Diagnoses  Preliminary cause of death:  septic shock with multiorgan failure Secondary Diagnoses (including complications and co-morbidities):  Active Problems:   Tachyarrhythmia   Respiratory failure (HCC)   Shock (HCC)   Acute respiratory failure (HCC)   Transaminitis   Pressure injury of skin   Brief Hospital Course (including significant findings, care, treatment, and services provided and events leading to death)   52 year old male with obesity, diabetes, hypertension came in with tachyarrhythmia and was noted to be in septic shock  *Septic shock with multiorgan failure.- PNA -patient was placed on the ventilator. He was on broad-spectrum antibiotics. He continued to overall decline and started having multiorgan failure. Palliative care consultation was obtained. Patient continued to decline overall did not carry a good prognosis. Patient's family then decided for terminal extubation.  * Hypernatremia with hypokalemia --resolved *Ectopic atrial tachycardia versus atrial flutter-remains tachycardic CT Angio of the chest and pelvis negative for any PE or aortic dissection. Echocardiogram with LV ejection fraction of 45% which is stable from prior. Patient was on IV heparin drip and amiodarone gtt   per cardiolgoy  *Acute renal failure with metabolic acidosis due to ATN from septic shock and contrast exposure due to CT scans.  Baseline creatinine 1.14 creatinine upto 3.36--4.05-patient was started on hemodialysis which was then terminated once comfort care measures were placed after family decided to terminally extubated  patient *Acute respiratory failure: Due to sepsis and multiorgan failure was on ventilator and management per ICU team * Elevated LFTs due to shock liver with multiorgan failure due to sepsis.      Pertinent Labs and Studies  Significant Diagnostic Studies Dg Chest 1 View  Result Date: 01/03/2018 CLINICAL DATA:  New intubation. EXAM: CHEST  1 VIEW COMPARISON:  01/03/2018 FINDINGS: Endotracheal tube has tip 4 cm above the carina. Nasogastric tube courses into the stomach and off the inferior portion of the film. Right IJ central venous catheter has tip over the SVC. Lungs are adequately inflated with new hazy prominence of the perihilar vasculature likely mild interstitial edema. Possible small amount right pleural fluid unchanged. No pneumothorax. Stable cardiomegaly. Remainder of the exam is unchanged. IMPRESSION: Findings suggesting new mild interstitial edema. Stable small amount right pleural fluid. Stable cardiomegaly. Tubes and lines as described. Electronically Signed   By: Elberta Fortis M.D.   On: 01/03/2018 09:28   Dg Chest 2 View  Result Date: 01-19-18 CLINICAL DATA:  Tachycardia and shortness of breath EXAM: CHEST - 2 VIEW COMPARISON:  10/16/2013 FINDINGS: The heart size and mediastinal contours are within normal limits. Both lungs are clear. The visualized skeletal structures are unremarkable. IMPRESSION: No acute abnormality noted. Electronically Signed   By: Alcide Clever M.D.   On: 01-19-2018 11:40   Dg Abd 1 View  Result Date: 01/18/2018 CLINICAL DATA:  Abdominal distension EXAM: ABDOMEN - 1 VIEW COMPARISON:  01/15/2018, CT 01/19/18 FINDINGS: Consolidation and pleural effusions at the bases. Esophageal tube tip and side port overlie the gastric body. Continued gaseous dilatation of the colon. Decreased small bowel gas. IMPRESSION: 1. Esophageal tube tip overlies the gastric body 2. Continued gaseous dilatation of the  colon, possible ileus Electronically Signed   By: Jasmine Pang M.D.   On: 01/18/2018 00:56   Dg Abd 1 View  Result Date: 01/15/2018 CLINICAL DATA:  Abdominal distention EXAM: ABDOMEN - 1 VIEW COMPARISON:  01/15/2017 FINDINGS: Examination is technically limited due to artifact from pads. Scattered gas-filled colon with mild distention of the cecum. This likely represents ileus. No small bowel distention is identified. No radiopaque stones. Surgical clips in the right upper quadrant. Enteric tube tip in the upper abdomen consistent with location in the body of the stomach. IMPRESSION: Mild prominence of gas-filled colon likely indicating ileus. Electronically Signed   By: Burman Nieves M.D.   On: 01/15/2018 21:07   Dg Abd 1 View  Result Date: 01/15/2018 CLINICAL DATA:  Vomiting. EXAM: ABDOMEN - 1 VIEW COMPARISON:  01/12/2018 FINDINGS: Artifact from pad limits evaluation. Abdomen is incompletely included within the field of view. There is scattered gas in the colon without abnormal small or large bowel distention. An enteric tube is present in the upper mid abdomen consistent with location in the body of the stomach. Tubing projected over the right femoral region may represent a femoral venous catheter. Degenerative changes in the spine. IMPRESSION: Nonobstructive bowel gas pattern. Enteric tube tip projects over the body of the stomach. Electronically Signed   By: Burman Nieves M.D.   On: 01/15/2018 04:42   Dg Abd 1 View  Result Date: 01/12/2018 CLINICAL DATA:  52 year old male with NG tube placement. EXAM: PORTABLE CHEST 1 VIEW COMPARISON:  CT of the abdomen pelvis dated 12/17/2017 and chest radiograph dated 01/11/2018 FINDINGS: An endotracheal tube is noted with tip approximately 5 cm above the carina. An enteric tube extends into the left hemiabdomen with tip to the left of the L1 spine over the gastric air likely in the mid body of the stomach. Right IJ central venous line with tip over central SVC. There is cardiomegaly with small bilateral pleural  effusions and associated atelectatic changes. Pneumonia is not excluded. Clinical correlation is recommended. There is no pneumothorax. No acute osseous pathology. IMPRESSION: 1. Cardiomegaly with small bilateral pleural effusions and associated atelectatic changes. 2. Support devices as described. Electronically Signed   By: Elgie Collard M.D.   On: 01/12/2018 05:56   Ct Head Wo Contrast  Result Date: 01/19/2018 CLINICAL DATA:  52 year old male is poorly responsive despite weaning fentanyl drip. Started hemodialysis. Low-grade fever. Encephalopathy. EXAM: CT HEAD WITHOUT CONTRAST TECHNIQUE: Contiguous axial images were obtained from the base of the skull through the vertex without intravenous contrast. COMPARISON:  Head CTs without contrast 01/14/2018 and earlier. FINDINGS: Brain: No midline shift, ventriculomegaly, mass effect, evidence of mass lesion, intracranial hemorrhage or evidence of cortically based acute infarction. Gray-white matter differentiation remains within normal limits throughout the brain. Vascular: Mild Calcified atherosclerosis at the skull base. No suspicious intracranial vascular hyperdensity. Tortuous basilar artery redemonstrated. Skull: No acute osseous abnormality identified. Sinuses/Orbits: Partial opacification of the right middle ear and mastoids has progressed since 01/14/2018 and is new compared to 01/03/2018. The other visible paranasal sinuses and mastoids are stable and well pneumatized. Other: The patient remains intubated on the scout view. Mildly Disconjugate gaze, otherwise negative orbits soft tissues. Visualized scalp soft tissues are within normal limits. IMPRESSION: 1. Stable and negative noncontrast CT appearance of the brain. 2. Right middle ear and mastoid fluid has progressed since 01/14/2018. This may be related to intubation. Otitis media is felt less likely. Electronically Signed   By: Althea Grimmer.D.  On: 01/19/2018 14:24   Ct Head Wo Contrast  Result  Date: 01/14/2018 CLINICAL DATA:  52 y/o  M; altered level of consciousness. EXAM: CT HEAD WITHOUT CONTRAST TECHNIQUE: Contiguous axial images were obtained from the base of the skull through the vertex without intravenous contrast. COMPARISON:  01/03/2018 CT head. FINDINGS: Brain: No evidence of acute infarction, hemorrhage, hydrocephalus, extra-axial collection or mass lesion/mass effect. Few stable nonspecific foci of hypoattenuation within subcortical white matter are compatible with mild chronic microvascular ischemic changes and there is mild brain parenchymal volume loss. Vascular: Mild calcific atherosclerosis of carotid siphons. Skull: Normal. Negative for fracture or focal lesion. Sinuses/Orbits: Small fluid level in the right posterior ethmoid air cells and the sphenoid sinus likely due to intubation. Right mastoid effusion. Other: None. IMPRESSION: No acute intracranial abnormality identified. Stable negative CT of the head. Electronically Signed   By: Mitzi Hansen M.D.   On: 01/14/2018 02:42   Ct Head Wo Contrast  Result Date: 01/03/2018 CLINICAL DATA:  Altered mental status change since yesterday. Unresponsive. EXAM: CT HEAD WITHOUT CONTRAST TECHNIQUE: Contiguous axial images were obtained from the base of the skull through the vertex without intravenous contrast. COMPARISON:  None. FINDINGS: Brain: No evidence of acute infarction, hemorrhage, hydrocephalus, extra-axial collection or mass lesion/mass effect. Vascular: No hyperdense vessel or unexpected calcification. Skull: Possible fracture along the posterolateral wall the right maxillary sinus as there is a small amount of air within the soft tissues just superficial to this region. Sinuses/Orbits: No acute finding. Other: None. IMPRESSION: No acute brain injury. Subtle air in the soft tissues just superficial to the posterolateral wall of the right maxillary sinus as cannot exclude a subtle fracture of the wall of the adjacent sinus  wall. Recommend clinical correlation. Electronically Signed   By: Elberta Fortis M.D.   On: 01/03/2018 10:18   Ct Chest Wo Contrast  Result Date: 01/14/2018 CLINICAL DATA:  Acute respiratory illness. Interstitial lung disease. Acute renal failure secondary to severe hypotension. Metabolic acidosis. Acute respiratory failure. Generalized edema. EXAM: CT CHEST WITHOUT CONTRAST TECHNIQUE: Multidetector CT imaging of the chest was performed following the standard protocol without IV contrast. COMPARISON:  01/03/2018 FINDINGS: Cardiovascular: Cardiac enlargement. No pericardial effusion. Normal caliber thoracic aorta. Minimal aortic calcification. Right central venous catheter with tip in the low SVC. Mediastinum/Nodes: Enteric tube with tip in the distal stomach. Esophagus is decompressed. Diffusely enlarged mediastinal lymph nodes. AP window nodes measure up to 13 mm short axis dimension. Right paratracheal nodes measure up to 1.5 cm short axis dimension. Lymph nodes are similar in appearance to previous study. Lungs/Pleura: Motion artifact limits examination. There are small bilateral pleural effusions with consolidation and atelectasis in both lung bases. This appearance is similar to previous study. There appears to be developing airspace infiltration throughout the right lung since previous study. This could represent increasing edema or multifocal pneumonia. No pneumothorax. Airways are patent. Upper Abdomen: Surgical absence of the gallbladder. Musculoskeletal: No chest wall mass or suspicious bone lesions identified. Degenerative changes in the thoracic spine. IMPRESSION: Cardiac enlargement. Small bilateral pleural effusions with consolidation and atelectasis in both lung bases. There appears to be developing airspace disease in the right lung since previous study. This could represent increasing edema or multifocal pneumonia. Mediastinal lymphadenopathy is similar to previous study and likely reactive.  Aortic Atherosclerosis (ICD10-I70.0). Electronically Signed   By: Burman Nieves M.D.   On: 01/14/2018 02:50   Ct Abdomen Pelvis W Contrast  Result Date: 12/25/2017 CLINICAL DATA:  Abdominal  distension EXAM: CT ABDOMEN AND PELVIS WITH CONTRAST TECHNIQUE: Multidetector CT imaging of the abdomen and pelvis was performed using the standard protocol following bolus administration of intravenous contrast. CONTRAST:  ISOVUE-300 IOPAMIDOL (ISOVUE-300) INJECTION 61% COMPARISON:  None. FINDINGS: Lower chest: Small right pleural effusion is noted. No focal infiltrate is identified. Hepatobiliary: Diffuse decreased attenuation in the liver is noted consistent with fatty infiltration. The gallbladder has been surgically removed. Pancreas: Unremarkable. No pancreatic ductal dilatation or surrounding inflammatory changes. Spleen: Normal in size without focal abnormality. Adrenals/Urinary Tract: Adrenal glands are within normal limits. The kidneys demonstrate a nonobstructing right lower pole renal stone. No obstructive changes are seen. No ureteral stones are noted. The bladder is well distended. Stomach/Bowel: Scattered diverticular changes noted. No evidence of diverticulitis is seen. No obstructive or inflammatory changes are noted. The appendix is within normal limits. Vascular/Lymphatic: No significant vascular findings are present. No enlarged abdominal or pelvic lymph nodes. Reproductive: Prostate is unremarkable. Other: No abdominal wall hernia or abnormality. No abdominopelvic ascites. Musculoskeletal: Degenerative changes of lumbar spine are noted. IMPRESSION: Tiny nonobstructing right lower pole renal stone. Small right-sided pleural effusion. No other focal abnormality is noted to correspond with the patient's given clinical history. Electronically Signed   By: Alcide Clever M.D.   On: 2018/01/05 13:30   US Venous Img Lower Bilateral  Result Date: 01/05/2018 CLINICAL DATA:  Bilateral lower extremity  swelling EXAM: BILATERAL LOWER EXTREMITY VENOUS DOPPLER ULTRASOUND TECHNIQUE: Gray-scale sonography with graded compression, as well as color Doppler and duplex ultrasound were performed to evaluate the lower extremity deep venous systems from the level of the common femoral vein and including the common femoral, femoral, profunda femoral, popliteal and calf veins including the posterior tibial, peroneal and gastrocnemius veins when visible. The superficial great saphenous vein was also interrogated. Spectral Doppler was utilized to evaluate flow at rest and with distal augmentation maneuvers in the common femoral, femoral and popliteal veins. COMPARISON:  None. FINDINGS: RIGHT LOWER EXTREMITY Common Femoral Vein: Unable to be evaluated due to overlying dressing from recent catheter placement Saphenofemoral Junction: Unable to be evaluated due to overlying dressing from recent catheter placement Profunda Femoral Vein: Unable to be evaluated due to overlying dressing from recent catheter placement Femoral Vein: No evidence of thrombus. Normal compressibility, respiratory phasicity and response to augmentation. Popliteal Vein: No evidence of thrombus. Normal compressibility, respiratory phasicity and response to augmentation. Calf Veins: No evidence of thrombus. Normal compressibility and flow on color Doppler imaging. Superficial Great Saphenous Vein: No evidence of thrombus. Normal compressibility. Venous Reflux:  None. Other Findings:  None. LEFT LOWER EXTREMITY Common Femoral Vein: No evidence of thrombus. Normal compressibility, respiratory phasicity and response to augmentation. Saphenofemoral Junction: No evidence of thrombus. Normal compressibility and flow on color Doppler imaging. Profunda Femoral Vein: No evidence of thrombus. Normal compressibility and flow on color Doppler imaging. Femoral Vein: No evidence of thrombus. Normal compressibility, respiratory phasicity and response to augmentation. Popliteal  Vein: No evidence of thrombus. Normal compressibility, respiratory phasicity and response to augmentation. Calf Veins: No evidence of thrombus. Normal compressibility and flow on color Doppler imaging. Superficial Great Saphenous Vein: No evidence of thrombus. Normal compressibility. Venous Reflux:  None. Other Findings:  None. IMPRESSION: No evidence of deep venous thrombosis. The right femoral vein has a central venous line within and could not be well evaluated. Electronically Signed   By: Alcide Clever M.D.   On: 01/05/2018 15:22   Dg Chest Port 1 View  Result Date: 01/18/2018  CLINICAL DATA:  Abdominal distension, intubated patient EXAM: PORTABLE CHEST 1 VIEW COMPARISON:  01/17/2018, 01/15/2018, CT chest 01/14/2018 FINDINGS: Endotracheal tube tip is about 4.5 cm superior to the carina. Esophageal tube tip extends below the diaphragm but is not included on the image. Right-sided central venous catheter tip overlies the SVC. Cardiomegaly with vascular congestion and mild inter lung edema. Moderate to large layering right pleural effusion and at least small left effusion. No change in bibasilar consolidations. Old left clavicle fracture IMPRESSION: 1. Endotracheal tube tip about 4.5 cm superior to the carina 2. Continued moderate likely layering right-sided pleural effusion with small moderate left pleural effusion. 3. Cardiomegaly with vascular congestion and underlying edema. No change in bibasilar consolidations. Electronically Signed   By: Jasmine Pang M.D.   On: 01/18/2018 00:54   Dg Chest Port 1 View  Result Date: 01/17/2018 CLINICAL DATA:  Acute respiratory failure EXAM: PORTABLE CHEST 1 VIEW COMPARISON:  January 16, 2018 FINDINGS: The ETT and right central line are stable and in good position. The OG tube terminates below today's film. Bilateral effusions, right greater than left, improved on the right stable on the left. Opacities underlying the effusions may represent atelectasis. Stable  cardiomediastinal silhouette. IMPRESSION: 1. Support apparatus as above. 2. Bilateral pleural effusions, right greater than left, stable on the left and improved on the right in the interval with underlying atelectasis. Electronically Signed   By: Gerome Sam III M.D   On: 01/17/2018 07:02   Dg Chest Port 1 View  Result Date: 01/16/2018 CLINICAL DATA:  Respiratory failure. EXAM: PORTABLE CHEST 1 VIEW COMPARISON:  01/15/2018. FINDINGS: Tracheostomy tube, NG tube, right IJ line in stable position. Persistent cardiomegaly. Persistent bilateral pulmonary infiltrates/edema and basilar atelectasis again noted. Slight worsening from prior exam. Right pleural effusion with slight progression from prior exam. Small left pleural effusion cannot be excluded. No pneumothorax noted. IMPRESSION: 1.  Lines and tubes stable position. 2. Cardiomegaly again noted. Bilateral pulmonary infiltrates/edema and bibasilar atelectasis again noted. Slight progression from prior exam. Right pleural effusion with slight progression from prior exam. Small left pleural effusion cannot be excluded on today's exam. Electronically Signed   By: Maisie Fus  Register   On: 01/16/2018 06:57   Dg Chest Port 1 View  Result Date: 01/15/2018 CLINICAL DATA:  52 year old critically ill male with fever, septic shock with multi organ failure. EXAM: PORTABLE CHEST 1 VIEW COMPARISON:  Portable chest 0408 hours today, and earlier. FINDINGS: Portable AP semi upright view at 1602 hours. Stable endotracheal tube tip in good position between the clavicles and carina. Enteric tube courses to the abdomen, tip not included. Stable right IJ central line. Stable cardiomegaly and mediastinal contours. Dense retrocardiac and lung base opacity with superimposed veiling opacity on the right is stable. No superimposed pneumothorax. Pulmonary vascularity is stable. Paucity of bowel gas in the upper abdomen. IMPRESSION: 1.  Stable lines and tubes. 2. Stable ventilation with  bilateral lower lobe consolidation and right pleural effusion. Electronically Signed   By: Odessa Fleming M.D.   On: 01/15/2018 16:30   Dg Chest Port 1 View  Result Date: 01/15/2018 CLINICAL DATA:  Acute respiratory failure EXAM: PORTABLE CHEST 1 VIEW COMPARISON:  Chest 01/13/2018 FINDINGS: Endotracheal tube with tip measuring 3.4 cm above the carina. Enteric tube tip is off the field of view but below the left hemidiaphragm. Right central venous catheter with tip over the low SVC region. Cardiac enlargement. Bilateral pleural effusions with basilar atelectasis. IMPRESSION: Appliances appear in satisfactory position.  Cardiac enlargement. Bilateral pleural effusions with basilar atelectasis. Electronically Signed   By: Burman NievesWilliam  Stevens M.D.   On: 01/15/2018 04:41   Dg Chest Port 1 View  Result Date: 01/13/2018 CLINICAL DATA:  52 year old male with sepsis and respiratory failure. Acute renal failure. Intubated. EXAM: PORTABLE CHEST 1 VIEW COMPARISON:  01/12/2018 and earlier. FINDINGS: Portable AP semi upright view at 0756 hours. Stable endotracheal tube tip at the level the clavicles. Stable right IJ central line. Enteric tube courses to the left upper quadrant, tip not included. Stable lung volumes. No pneumothorax. Stable pulmonary vascularity without overt edema. Dense retrocardiac and veiling bibasilar opacity is stable. Paucity of bowel gas in the upper abdomen. IMPRESSION: 1.  Stable lines and tubes. 2. Stable ventilation with bilateral lower lobe collapse or consolidation and probable small pleural effusions. Electronically Signed   By: Odessa FlemingH  Hall M.D.   On: 01/13/2018 09:13   Dg Chest Port 1 View  Result Date: 01/12/2018 CLINICAL DATA:  52 year old male with NG tube placement. EXAM: PORTABLE CHEST 1 VIEW COMPARISON:  CT of the abdomen pelvis dated 12/29/2017 and chest radiograph dated 01/11/2018 FINDINGS: An endotracheal tube is noted with tip approximately 5 cm above the carina. An enteric tube extends into  the left hemiabdomen with tip to the left of the L1 spine over the gastric air likely in the mid body of the stomach. Right IJ central venous line with tip over central SVC. There is cardiomegaly with small bilateral pleural effusions and associated atelectatic changes. Pneumonia is not excluded. Clinical correlation is recommended. There is no pneumothorax. No acute osseous pathology. IMPRESSION: 1. Cardiomegaly with small bilateral pleural effusions and associated atelectatic changes. 2. Support devices as described. Electronically Signed   By: Elgie CollardArash  Radparvar M.D.   On: 01/12/2018 05:56   Dg Chest Port 1 View  Result Date: 01/11/2018 CLINICAL DATA:  Fever EXAM: PORTABLE CHEST 1 VIEW COMPARISON:  Test radiograph 01/10/2018 FINDINGS: ET tube terminates in the mid trachea. Enteric tube courses inferior to the diaphragm. Right IJ central venous catheter tip projects over the superior vena cava. Stable cardiomegaly. Interval increase in bilateral airspace opacities. Small bilateral pleural effusions. IMPRESSION: Stable support apparatus. Cardiomegaly. Similar-appearing diffuse bilateral airspace opacities. Small layering bilateral effusions. Electronically Signed   By: Annia Beltrew  Davis M.D.   On: 01/11/2018 14:52   Dg Chest Port 1 View  Result Date: 01/10/2018 CLINICAL DATA:  Acute respiratory distress. EXAM: PORTABLE CHEST 1 VIEW COMPARISON:  01/09/2018. FINDINGS: Endotracheal tube in satisfactory position. Right jugular catheter tip in the superior vena cava. Nasogastric tube extending into the stomach. Stable enlarged cardiac silhouette. Mild increase in bibasilar opacity. Thoracic spine degenerative changes. IMPRESSION: Increased bibasilar atelectasis or pneumonia with stable cardiomegaly and probable pleural effusions. Electronically Signed   By: Beckie SaltsSteven  Reid M.D.   On: 01/10/2018 07:34   Dg Chest Port 1 View  Result Date: 01/09/2018 CLINICAL DATA:  On mechanically assisted ventilation. EXAM: PORTABLE  CHEST 1 VIEW COMPARISON:  01/10/2018 FINDINGS: Endotracheal tube is 4.7 cm above the carina. Nasogastric tube extends into the abdomen. Right jugular central line tip in the SVC region. Negative for pneumothorax. Hazy densities at both lung bases are suggestive for volume loss and possibly pleural effusions. Lung aeration has minimal change from the recent comparison examination. Heart size is upper limits of normal. Central vascular structures are mildly prominent. IMPRESSION: Stable appearance of the support apparatuses. Endotracheal tube is appropriately positioned above the carina. Persistent basilar chest densities. Findings are suggestive for combination  of volume loss/consolidation and pleural effusions. Minimal change from the recent comparison examination. Electronically Signed   By: Richarda Overlie M.D.   On: 01/09/2018 12:41   Dg Chest Port 1 View  Result Date: 01/08/2018 CLINICAL DATA:  Respiratory failure EXAM: PORTABLE CHEST 1 VIEW COMPARISON:  Yesterday FINDINGS: Endotracheal tube tip just below the clavicular heads. Right IJ line with tip at the SVC. An orogastric tube reaches the stomach. Cardiopericardial enlargement, stable. Haziness of the bilateral chest attributed to layering pleural fluid. No Kerley lines or air bronchogram. Remote left clavicle fracture. IMPRESSION: 1. Stable compared to yesterday. 2. Layering pleural effusions and atelectasis. 3. Unremarkable hardware positioning. Electronically Signed   By: Marnee Spring M.D.   On: 01/08/2018 08:50   Dg Chest Port 1 View  Result Date: 01/07/2018 CLINICAL DATA:  Acute respiratory failure EXAM: PORTABLE CHEST 1 VIEW COMPARISON:  01/04/2018 FINDINGS: Cardiac shadow is stable. Endotracheal tube, nasogastric catheter and right jugular central line are again seen and stable. Right-sided pleural effusion is again identified. Mild central vascular congestion is again noted. Mild bibasilar atelectasis is again noted. IMPRESSION: Stable  bibasilar atelectasis and moderate right effusion. Electronically Signed   By: Alcide Clever M.D.   On: 01/07/2018 10:52   Portable Chest Xray  Result Date: 01/04/2018 CLINICAL DATA:  Respiratory failure.  Follow-up exam. EXAM: PORTABLE CHEST 1 VIEW COMPARISON:  01/03/2018 and older studies. FINDINGS: Endotracheal tube, nasogastric tube and right internal jugular central venous line are stable. Vascular congestion, mild interstitial thickening and hazy lung base opacity, right greater than left, is similar to the prior exam allowing for differences in patient positioning. Lung base opacities consistent with combination of pleural effusions and atelectasis. No new lung abnormalities.  No pneumothorax. IMPRESSION: 1. No significant change from the most recent prior exam. 2. Persistent right greater than left pleural effusions with associated atelectasis. Vascular congestion without overt pulmonary edema. 3. Stable well-positioned support apparatus. Electronically Signed   By: Amie Portland M.D.   On: 01/04/2018 08:01   Dg Chest Port 1 View  Result Date: 01/03/2018 CLINICAL DATA:  Sudden onset respiratory distress in inpatient. EXAM: PORTABLE CHEST 1 VIEW COMPARISON:  01-12-2018 FINDINGS: Cardiac enlargement. Pulmonary vascularity is normal. Probable small right pleural effusion developing since previous study. Atelectasis in the lung bases. No focal consolidation is suggested. No pneumothorax. IMPRESSION: Cardiac enlargement. Developing small right pleural effusion. Atelectasis in the lung bases. Electronically Signed   By: Burman Nieves M.D.   On: 01/03/2018 06:42   Ct Angio Chest/abd/pel For Dissection W And/or W/wo  Result Date: 01/03/2018 CLINICAL DATA:  Pt intubated. Change in mental status since yesterday. Yesterday pt walking and talking, today pt unresponsive. Admitted to hospital with abdominal discomfort, SOB and tachycardia. Pt in acute respiratory distress. EXAM: CT ANGIOGRAPHY CHEST, ABDOMEN  AND PELVIS TECHNIQUE: Multidetector CT imaging through the chest, abdomen and pelvis was performed using the standard protocol during bolus administration of intravenous contrast. Multiplanar reconstructed images and MIPs were obtained and reviewed to evaluate the vascular anatomy. CONTRAST:  ISOVUE-370 IOPAMIDOL (ISOVUE-370) INJECTION 76% COMPARISON:  Current chest radiograph. FINDINGS: CTA CHEST FINDINGS Cardiovascular: Nurse satisfactory to opacification the segmental of the pulmonary arteries level. Study is limited by respiratory motion. Allowing for this, there is no evidence of a pulmonary embolism. Heart is mildly enlarged. No pericardial effusion. Mild three-vessel coronary artery calcifications. Great vessels normal in caliber. Mild atherosclerotic calcifications noted along the aortic arch. Mediastinum/Nodes: No neck base or axillary masses or pathologically  enlarged lymph nodes. There is mediastinal adenopathy. Largest prevascular node is 16 mm short axis. Largest right paratracheal node measures 3 cm in short axis. Nodes are predominantly shotty. Prominent nodes are noted along the hila, right greater than left. The trachea is patent. Endotracheal tube is well positioned. Nasal/orogastric tube passes below the diaphragm into the proximal to mid stomach. Lungs/Pleura: Small to moderate right and minimal left pleural effusions. There is dependent lower lobe opacity consistent with atelectasis. There is subtle areas of hazy opacity in the upper lobes that are likely due to the expiratory nature of the images and air trapping. No convincing pneumonia or pulmonary edema. No pneumothorax. Review of the MIP images confirms the above findings. CTA ABDOMEN AND PELVIS FINDINGS VASCULAR Aorta: Aorta is normal in caliber. No dissection. Minor atherosclerotic disease noted along the infrarenal abdominal aorta. Celiac: Patent without evidence of aneurysm, dissection, vasculitis or significant stenosis. SMA:  Patent without evidence of aneurysm, dissection, vasculitis or significant stenosis. Renals: Small renal arteries. Are dual renal arteries on the right. No plaque or stenosis. No evidence of vasculitis or fibromuscular dysplasia. IMA: Patent without evidence of aneurysm, dissection, vasculitis or significant stenosis. Inflow: The iliac arteries are relatively small. There is mild atherosclerotic disease along the common iliac arteries and internal iliac arteries. No hemodynamically significant stenosis of either common iliac or external iliac artery. Veins: No obvious venous abnormality within the limitations of this arterial phase study. Review of the MIP images confirms the above findings. NON-VASCULAR Hepatobiliary: No focal liver abnormality is seen. Status post cholecystectomy. No biliary dilatation. Pancreas: Unremarkable. No pancreatic ductal dilatation or surrounding inflammatory changes. Spleen: Normal in size without focal abnormality. Adrenals/Urinary Tract: No adrenal masses. Mild bilateral renal cortical thinning. Nonobstructing stone in the lower pole the right kidney. No hydronephrosis. Ureters are normal course and caliber. No ureteral stones. Bladder is decompressed by a Foley catheter. Stomach/Bowel: Stomach is within normal limits. Appendix appears normal. No evidence of bowel wall thickening, distention, or inflammatory changes. Lymphatic: No pathologically enlarged lymph nodes. Reproductive: Unremarkable. Other: There is a small amount of pelvic free fluid MUSCULOSKELETAL FINDINGS No fracture or acute finding. No osteoblastic or osteolytic lesions. Review of the MIP images confirms the above findings. IMPRESSION: CTA CHEST 1. No evidence of a pulmonary embolism. 2. Small to moderate right and minimal left pleural effusions associated with dependent lower lobe atelectasis. 3. Mild hazy increased opacity in the upper lobes. This is most likely due to air trapping. 4. No convincing pneumonia.  No  pulmonary edema. CTA ABDOMEN AND PELVIS 1. No aortic dissection or aneurysm. Minor aortic atherosclerotic change. Aortic branch vessels are widely patent. 2. No acute findings within the abdomen or pelvis. 3. Small amount of pelvic free fluid, nonspecific. 4. Small nonobstructing stone in the lower pole the right kidney. Electronically Signed   By: Amie Portland M.D.   On: 01/03/2018 10:06    Microbiology Recent Results (from the past 240 hour(s))  CULTURE, BLOOD (ROUTINE X 2) w Reflex to ID Panel     Status: None   Collection Time: 01/19/18  4:25 PM  Result Value Ref Range Status   Specimen Description BLOOD LEFT HAND  Final   Special Requests   Final    BOTTLES DRAWN AEROBIC AND ANAEROBIC Blood Culture adequate volume   Culture   Final    NO GROWTH 5 DAYS Performed at [facility], [address] [place], [place] [address]    Report Status 01/24/2018 FINAL  Final  Lab Basic Metabolic Panel: No results for input(s): NA, K, CL, CO2, GLUCOSE, BUN, CREATININE, CALCIUM, MG, PHOS in the last 168 hours. Liver Function Tests: No results for input(s): AST, ALT, ALKPHOS, BILITOT, PROT, ALBUMIN in the last 168 hours. No results for input(s): LIPASE, AMYLASE in the last 168 hours. No results for input(s): AMMONIA in the last 168 hours. CBC: No results for input(s): WBC, NEUTROABS, HGB, HCT, MCV, PLT in the last 168 hours. Cardiac Enzymes: No results for input(s): CKTOTAL, CKMB, CKMBINDEX, TROPONINI in the last 168 hours. Sepsis Labs: No results for input(s): PROCALCITON, WBC, LATICACIDVEN in the last 168 hours.     Enedina Finner 01/29/2018, 3:14 PM

## 2018-02-12 NOTE — Progress Notes (Signed)
Chaplain received page at 03:07 to provide grief support for the [facility] family. Chaplain met with the family, offered condolences and led prayers of rememberance and consecration. Further, Chaplain contacted the [facility] and began the communication process about next steps. The family has not made funeral arrangements. Chaplain offered emotional support, pastoral presence, and words of encouragement.

## 2018-02-12 NOTE — Progress Notes (Signed)
Pnt expired at 0306 01/24/2018 wife wife at bedside. Both myself, Ricardo Kayes and Kennyth ArnoldYakana Cross verified and pronounced the death. Notified Dr. Sheryle Hailiamond and Sherryl BartersAC Stephanie of death. Provided information to Chaplain to come see family as well.

## 2018-02-12 DEATH — deceased

## 2018-03-23 ENCOUNTER — Ambulatory Visit: Payer: Self-pay | Admitting: Family Medicine

## 2018-11-29 IMAGING — CT CT HEAD W/O CM
3 of 5 series · 14 of 47 positions shown, 16 images · non-contrast
Comparison: Head CTs without contrast 01/14/2018 and earlier.

CLINICAL DATA: 52-year-old male is poorly responsive despite
weaning fentanyl drip. Started hemodialysis. Low-grade fever.
Encephalopathy.

EXAM:
CT HEAD WITHOUT CONTRAST
TECHNIQUE: Contiguous axial images were obtained from the base of the skull
through the vertex without intravenous contrast.

[Series 3: head wo · axial · 0.47mm/px · z∈[-137,+3]mm · 8 of 34 slices shown, 10 images]
[im 3/34  brain]
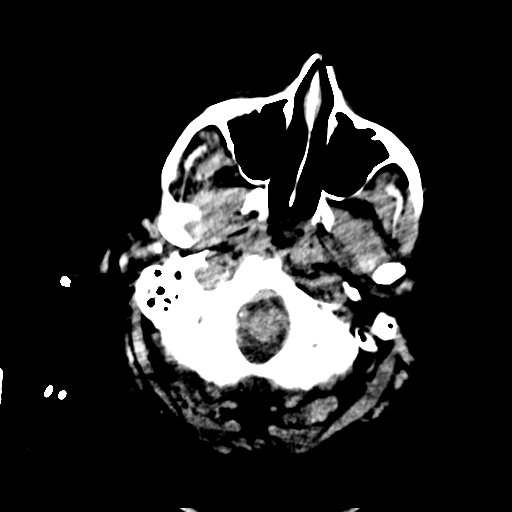
[im 3/34  bone]
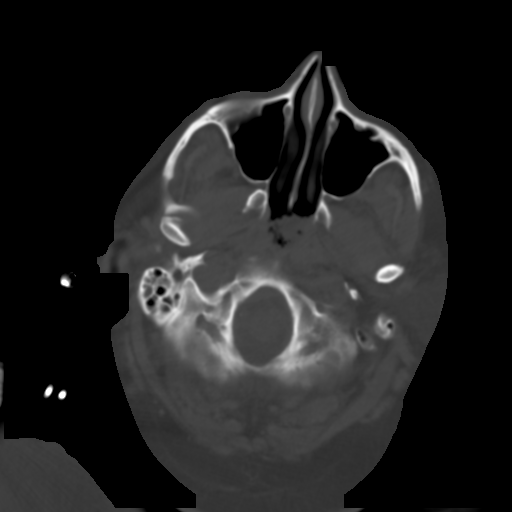
[im 8/34  brain]
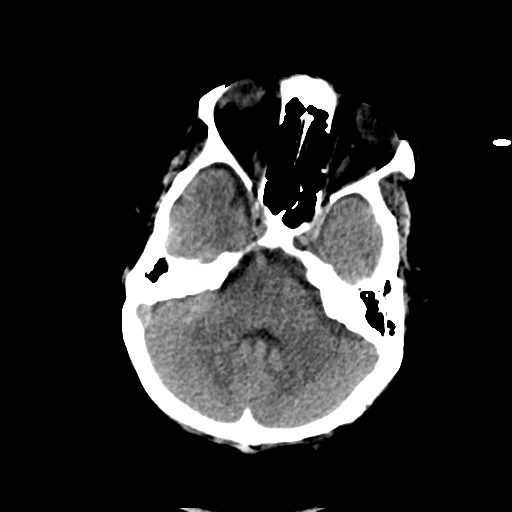
[im 11/34  brain]
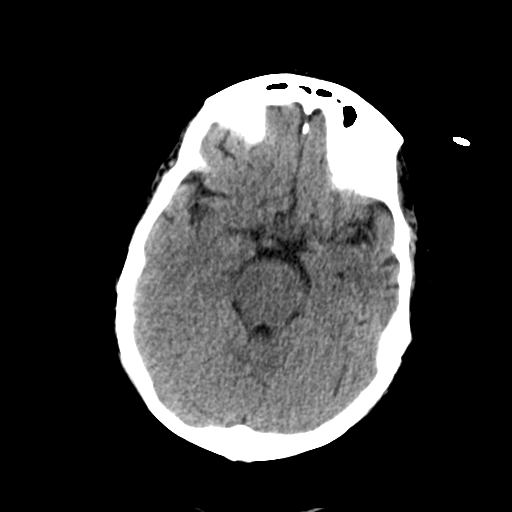
[im 16/34  brain]
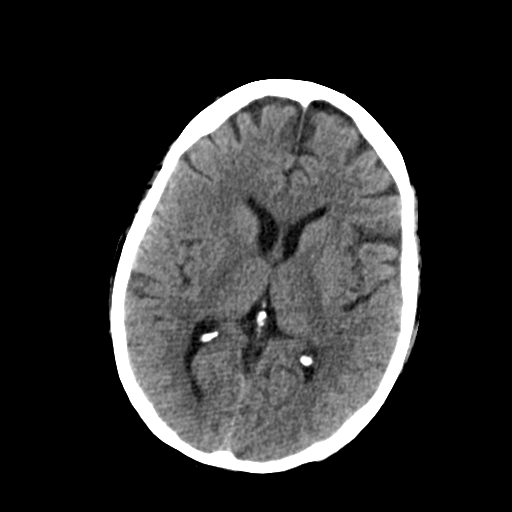
[im 18/34  brain]
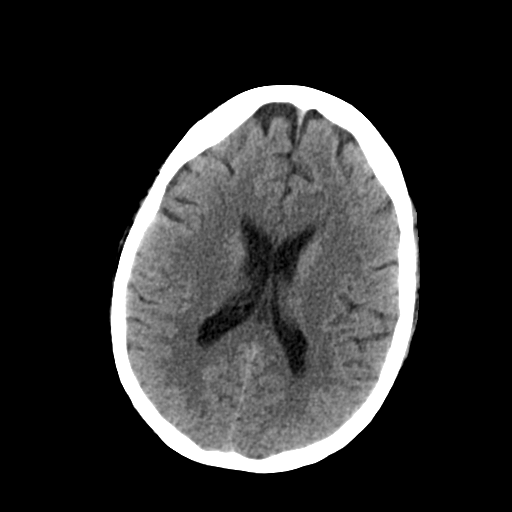
[im 18/34  bone]
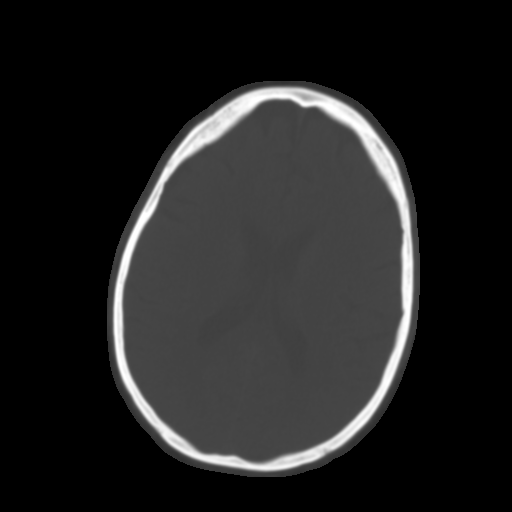
[im 23/34  brain]
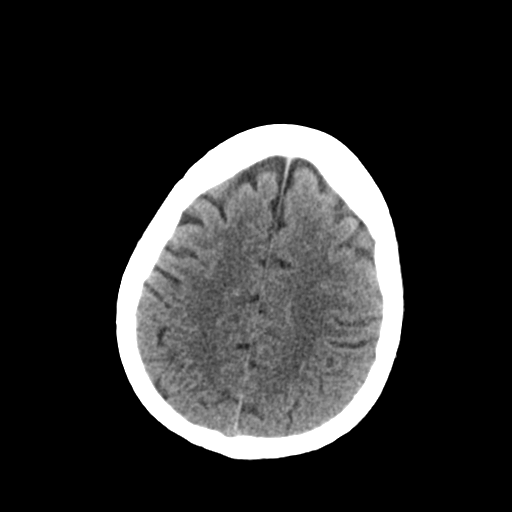
[im 26/34  brain]
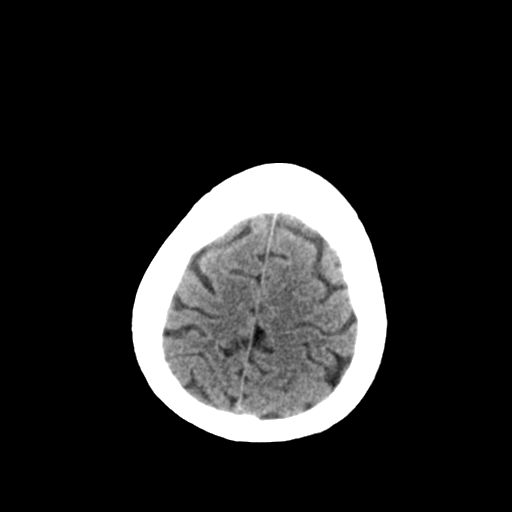
[im 31/34  brain]
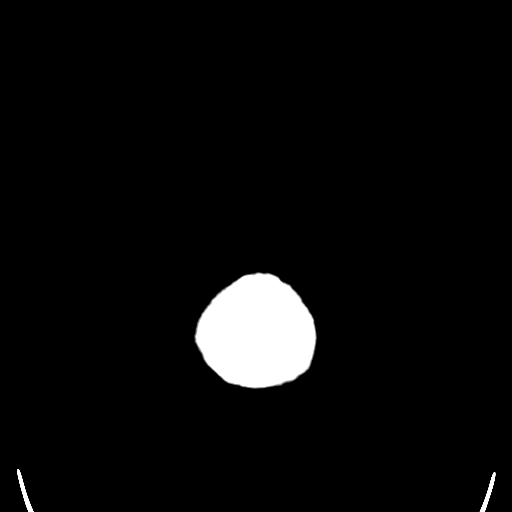

[Series 5: coronal soft tissue · coronal · 0.31mm/px · 3 of 65 slices shown]
[im 22/65  brain]
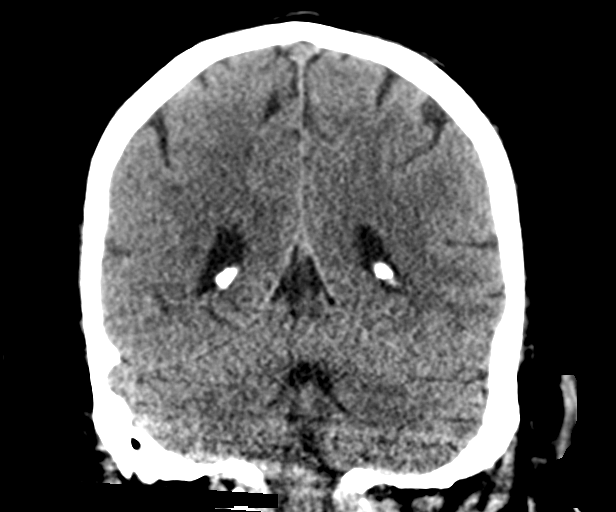
[im 29/65  brain]
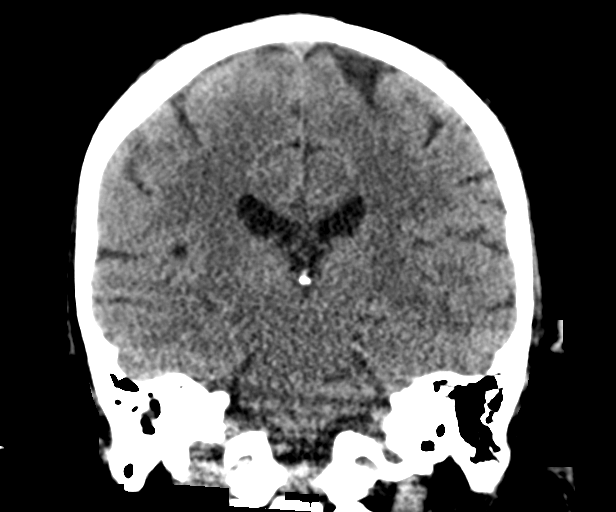
[im 36/65  brain]
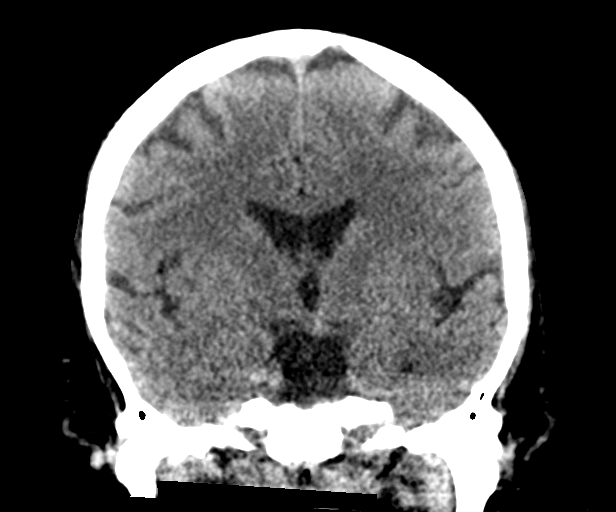

[Series 6: sagittal soft tissue · sagittal · 0.33mm/px · 3 of 53 slices shown]
[im 18/53  brain]
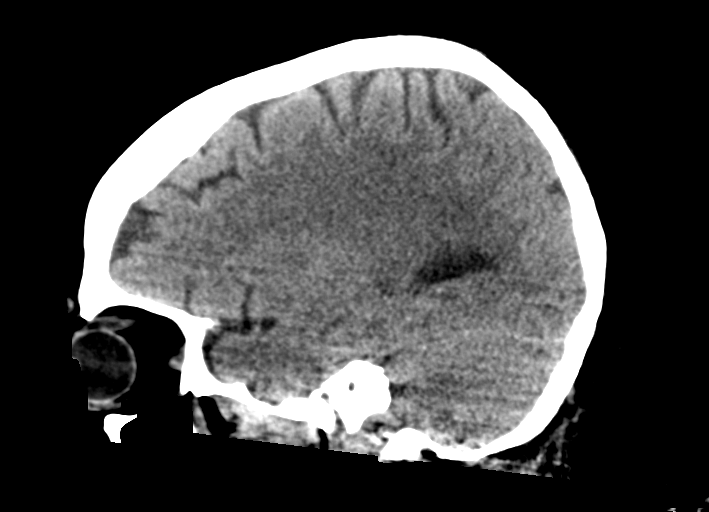
[im 27/53  brain]
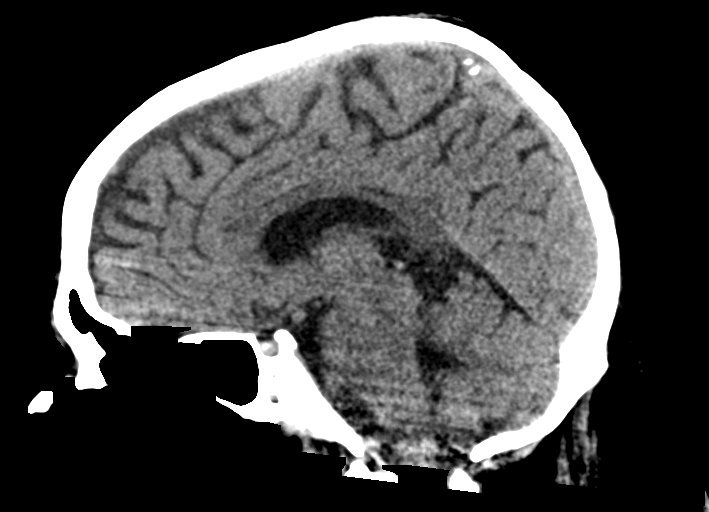
[im 35/53  brain]
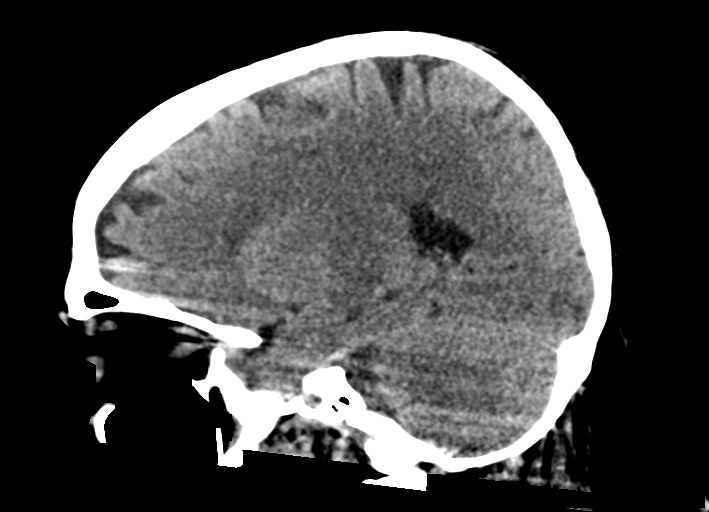

[14 of 47 positions shown; findings below may reference images not displayed]

FINDINGS: Brain: No midline shift, ventriculomegaly, mass effect, evidence of
mass lesion, intracranial hemorrhage or evidence of cortically based
acute infarction. Gray-white matter differentiation remains within
normal limits throughout the brain.

Vascular: Mild Calcified atherosclerosis at the skull base. No
suspicious intracranial vascular hyperdensity. Tortuous basilar
artery redemonstrated.

Skull: No acute osseous abnormality identified.

Sinuses/Orbits: Partial opacification of the right middle ear and
mastoids has progressed since 01/14/2018 and is new compared to
01/03/2018. The other visible paranasal sinuses and mastoids are
stable and well pneumatized.

Other: The patient remains intubated on the scout view. Mildly
Disconjugate gaze, otherwise negative orbits soft tissues.
Visualized scalp soft tissues are within normal limits.
IMPRESSION: 1. Stable and negative noncontrast CT appearance of the brain.
2. Right middle ear and mastoid fluid has progressed since
01/14/2018. This may be related to intubation. Otitis media is felt
less likely.
